# Patient Record
Sex: Female | Born: 1995 | Race: Asian | Hispanic: No | Marital: Single
Health system: Midwestern US, Academic
[De-identification: ages and names within clinical notes are randomized; demographics above are authoritative.]

---

## 2018-01-15 IMAGING — CT Head^_WITHOUT_CONTRAST (Adult)
1 series · 16 of 30 positions shown, 20 images · non-contrast
Comparison: none

PROCEDURE: Head _WITHOUT_CONTRAST (Adult)
HISTORY: HX SCHIZO AFFECTIVE DISORDER. PT PRESENTS COMBATIVE, CONFUSED. INIDEU
PUNIT FIFE IS COMING. NEG UCG
TECHNIQUE: Axial CT imaging of the brain was performed without contrast. No
comparison study available.

[Series 2: brain w/o 4.8 brain · axial · non-contrast · 0.46mm/px · z∈[+96,+236]mm · 16 of 33 slices shown, 20 images]
[im 2/33  brain]
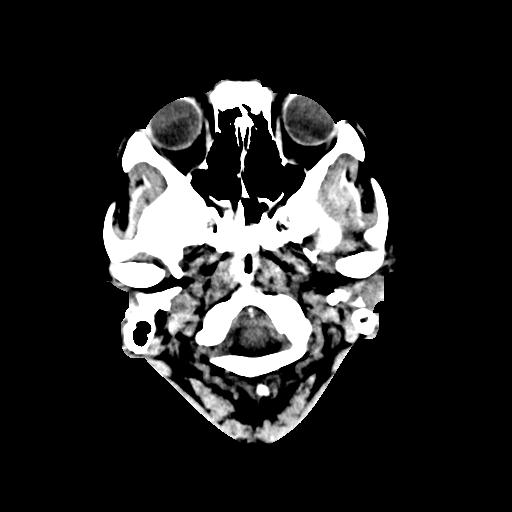
[im 2/33  bone]
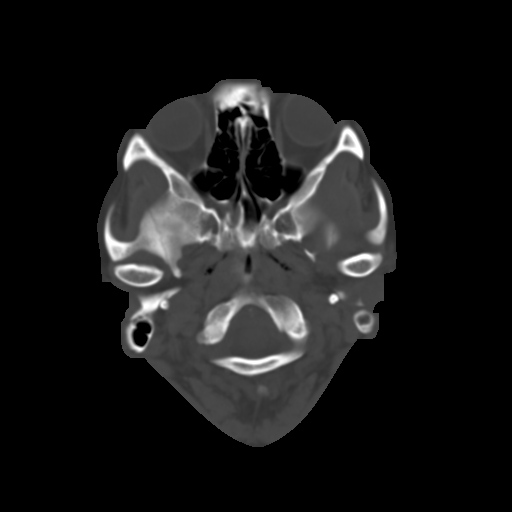
[im 4/33  brain]
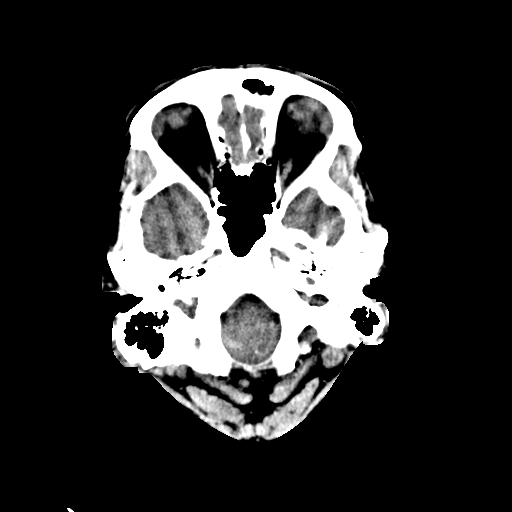
[im 6/33  brain]
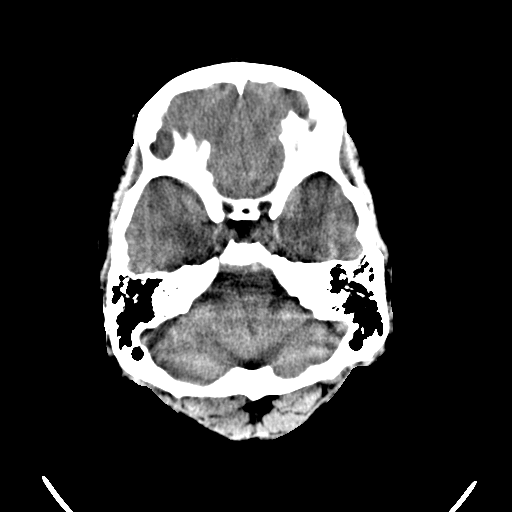
[im 8/33  brain]
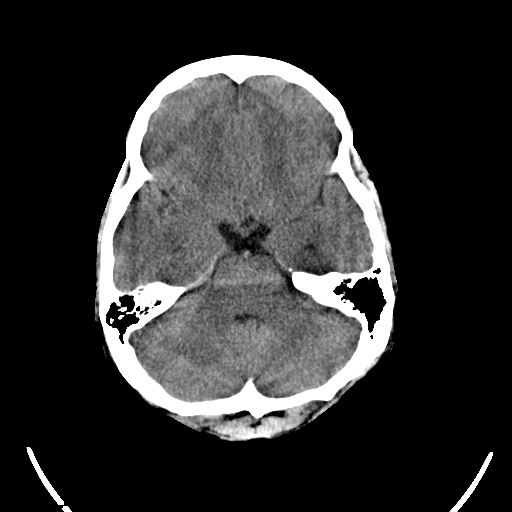
[im 9/33  brain]
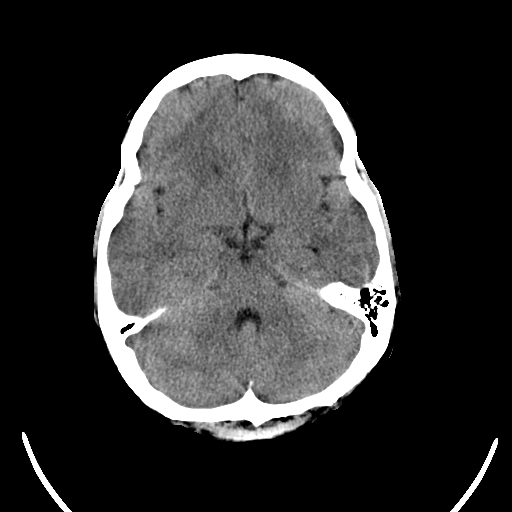
[im 9/33  bone]
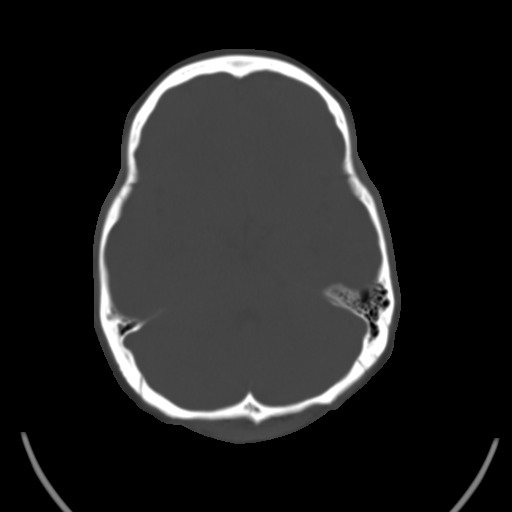
[im 12/33  brain]
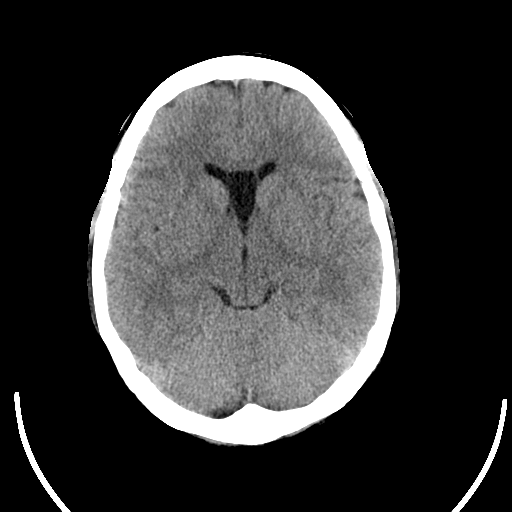
[im 14/33  brain]
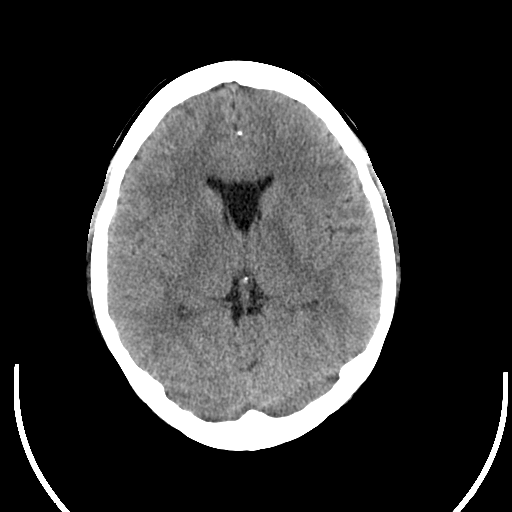
[im 16/33  brain]
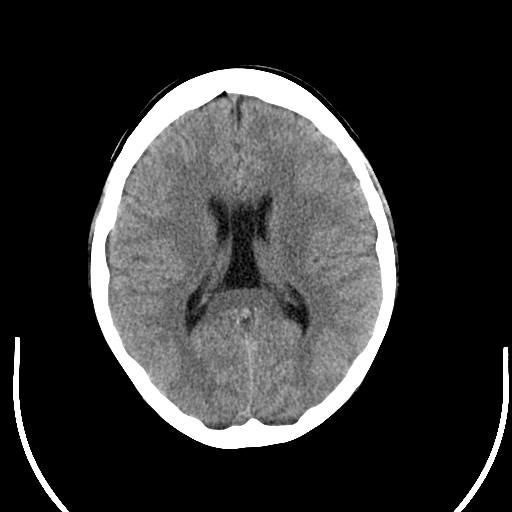
[im 17/33  brain]
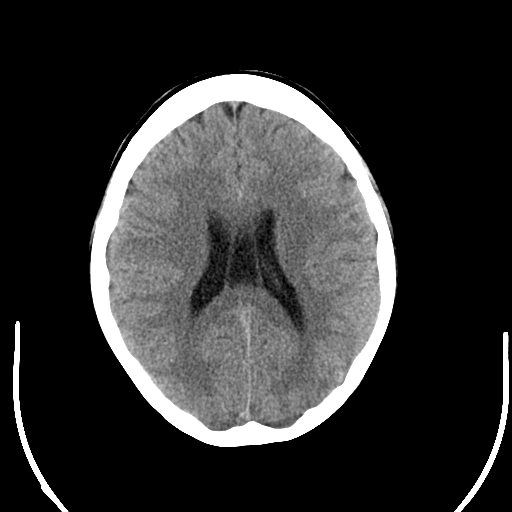
[im 17/33  bone]
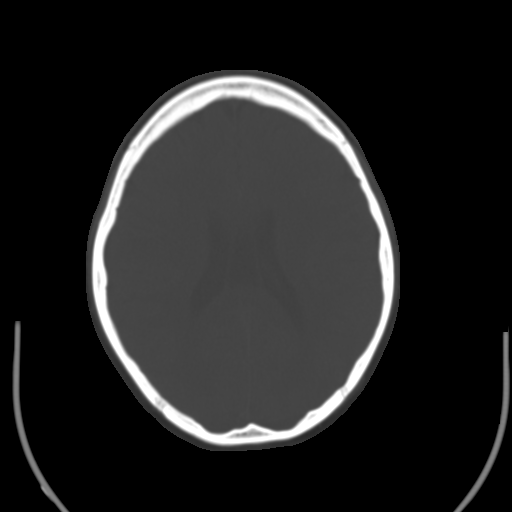
[im 19/33  brain]
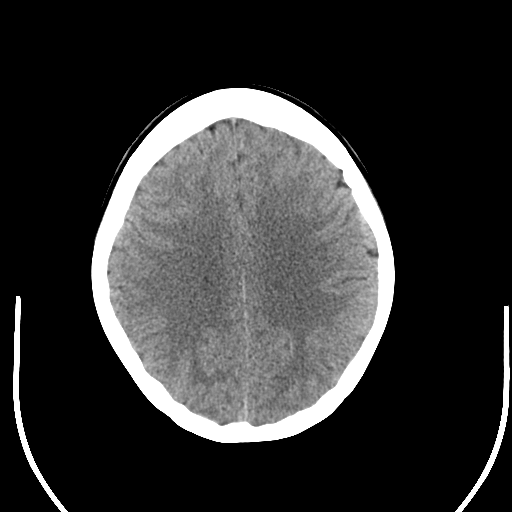
[im 21/33  brain]
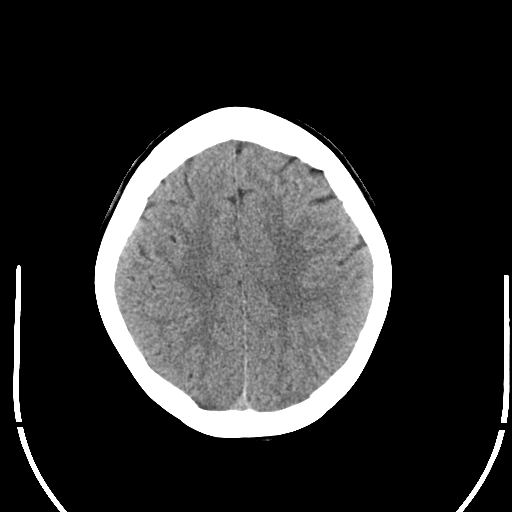
[im 24/33  brain]
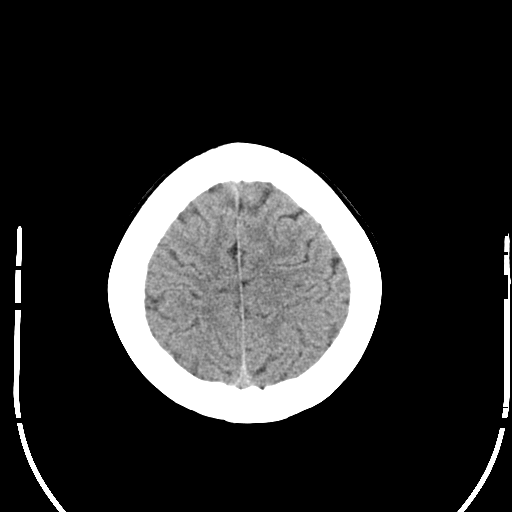
[im 25/33  brain]
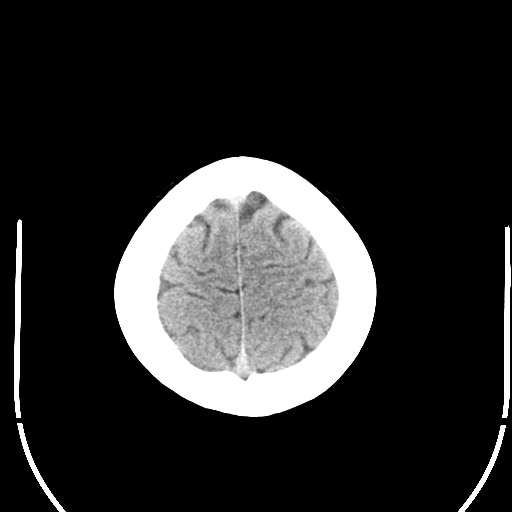
[im 25/33  bone]
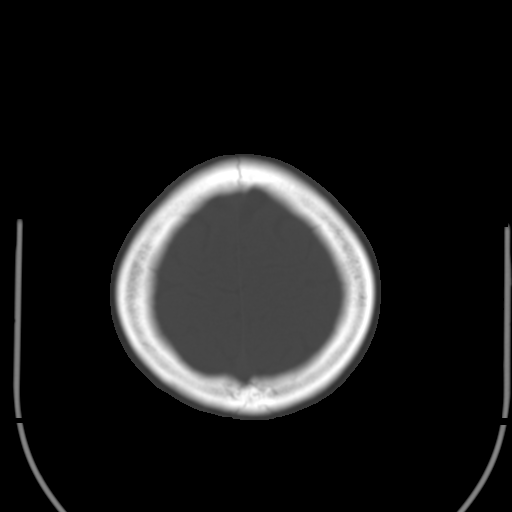
[im 27/33  brain]
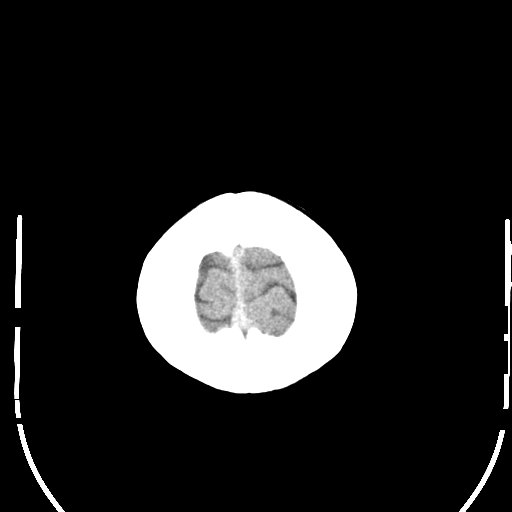
[im 29/33  brain]
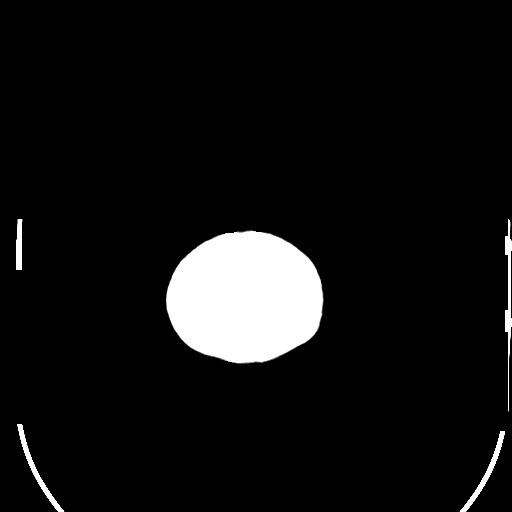
[im 31/33  brain]
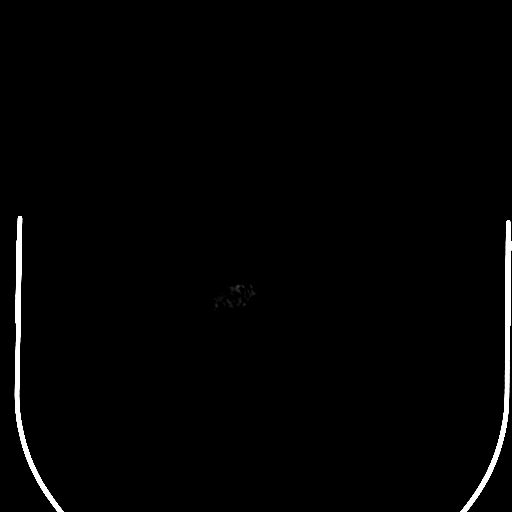

[16 of 30 positions shown; findings below may reference images not displayed]

FINDINGS: There is good gray white differentiation and sulcal detail. I don't see any acute
large vessel distribution infarction, intracranial bleed, or focal mass. No significant
periventricular or subcortical hypodensities are seen. The ventricles are normal in size and
midline in position. Incidental cavum vergae and septum pellucidum is seen. I don't see
any cerebellopontine angle mass. CT visualization of the brainstem, basal ganglia, and
cerebellum are within normal limits. No displaced skull fracture seen. The paranasal
sinuses are clear.
IMPRESSION: No acute large vessel distribution infarction, intracranial bleed, or focal mass seen.

Tech Notes:

PT COMBATIVE, HX SCHIZO-AFFECTIVE DISORDER.  BELIEVES THE PUNIT FIFE IS COMING.

## 2018-02-08 IMAGING — CR CHEST
1 series · 1 of 1 positions shown · non-contrast
Comparison: none

[chest port]
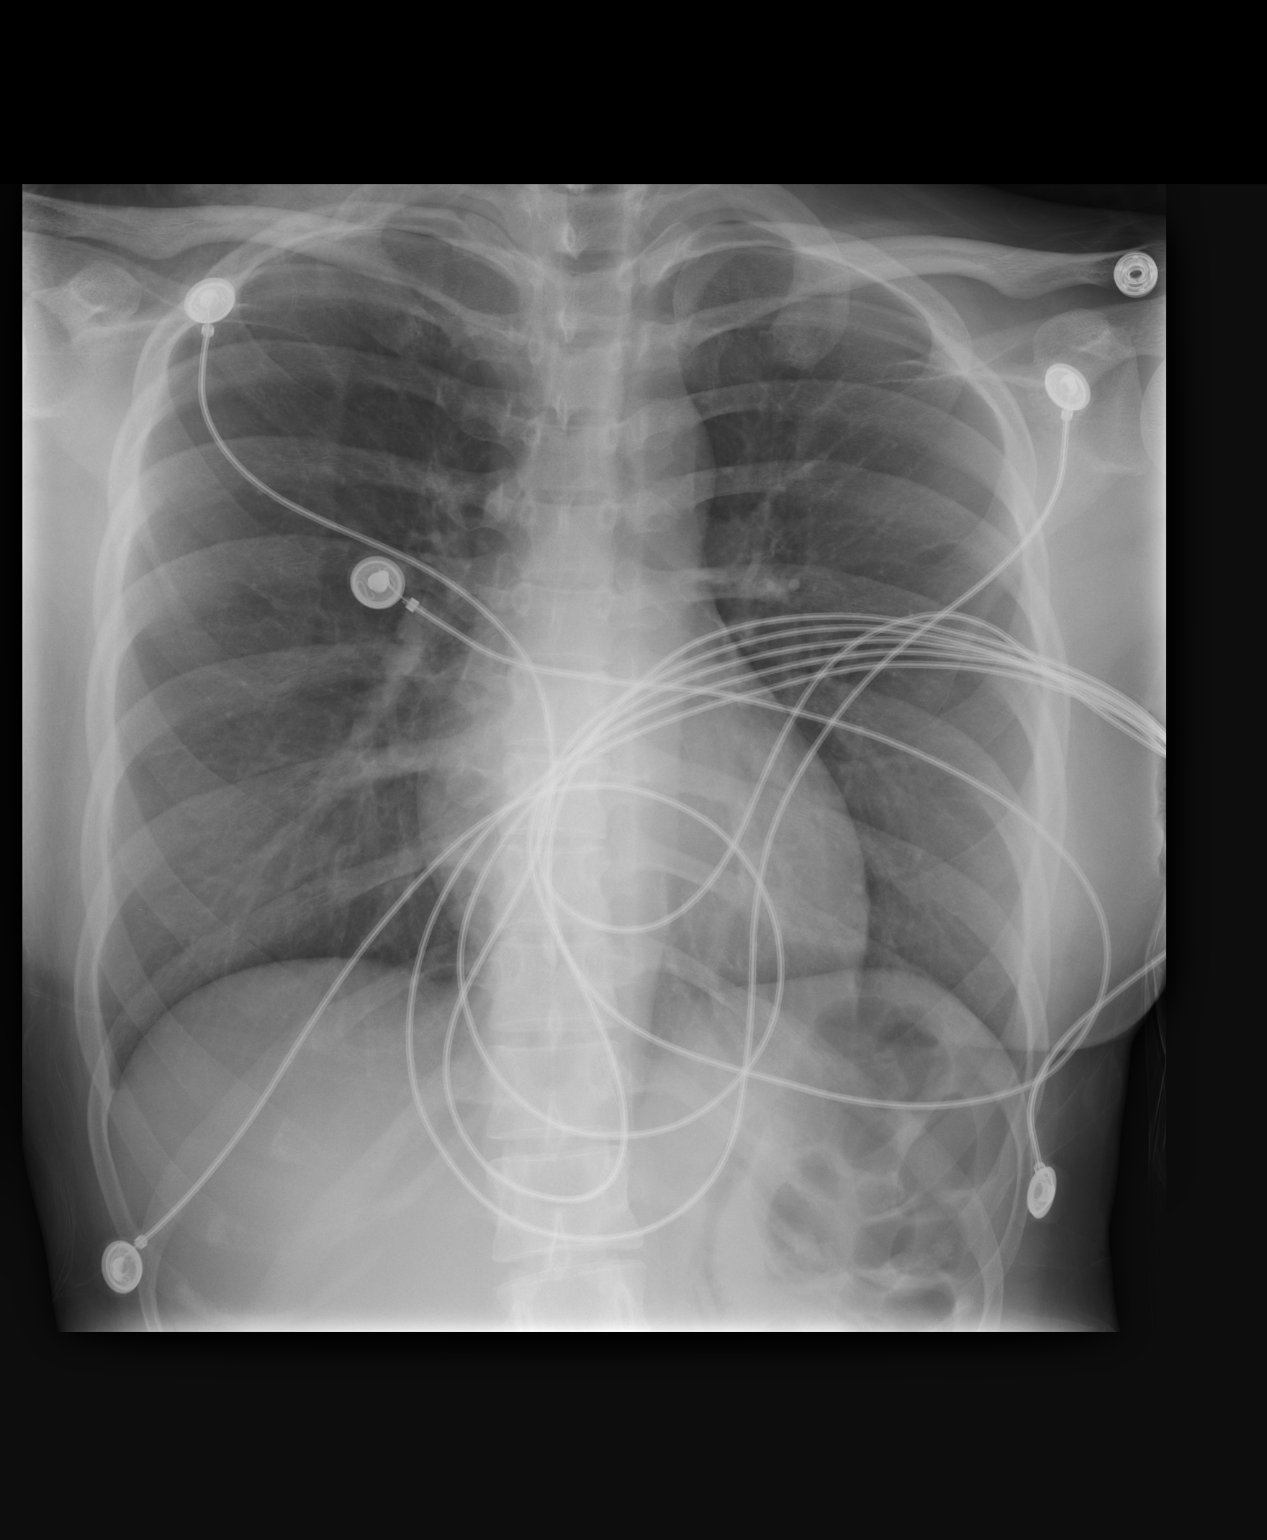

[1 of 1 positions shown; findings below may reference images not displayed]

EXAM
RADIOLOGICAL EXAMINATION, CHEST; SINGLE VIEW, FRONTAL CPT 46464

INDICATION
Overdose
AMS, POSSIBLE OVERDOSE - PT SHIELDED - AK

TECHNIQUE
A single portable view of the chest was obtained.

COMPARISONS
None

FINDINGS
The lungs are clear and the heart size and pulmonary vascularity are normal.

IMPRESSION
Normal portable chest x-ray.

Tech Notes:

AMS, POSSIBLE OVERDOSE - PT SHIELDED - AK

## 2018-02-08 IMAGING — CT Head^_WITHOUT_CONTRAST (Adult)
2 series · 14 of 30 positions shown, 16 images · non-contrast
Comparison: none

[Series 2: brain w/o 4.8 brain · axial · non-contrast · 0.55mm/px · z∈[+113,+201]mm · 6 of 11 slices shown, 8 images]
[im 2/11  brain]
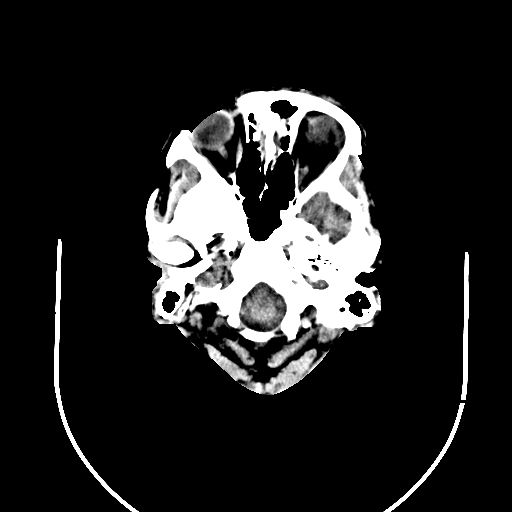
[im 2/11  bone]
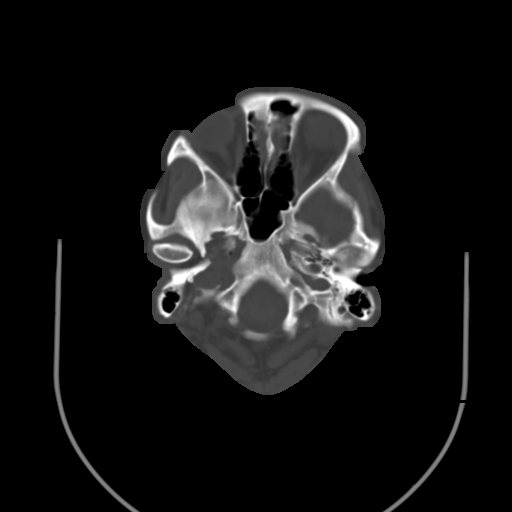
[im 3/11  brain]
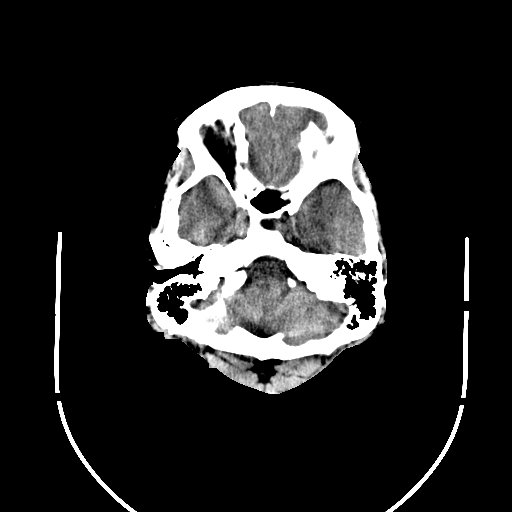
[im 5/11  brain]
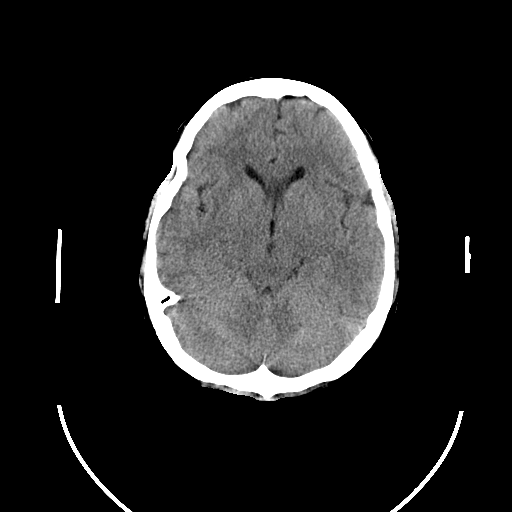
[im 6/11  brain]
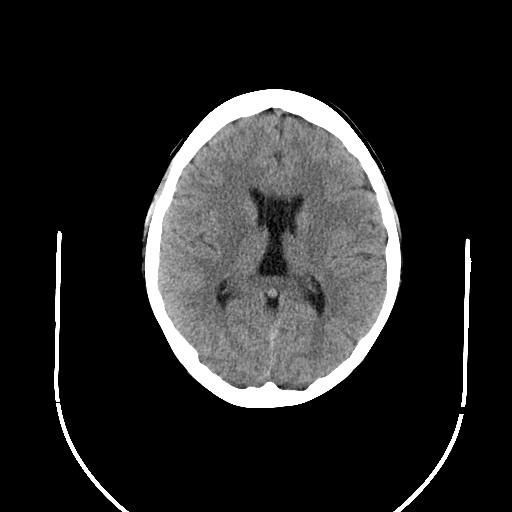
[im 8/11  brain]
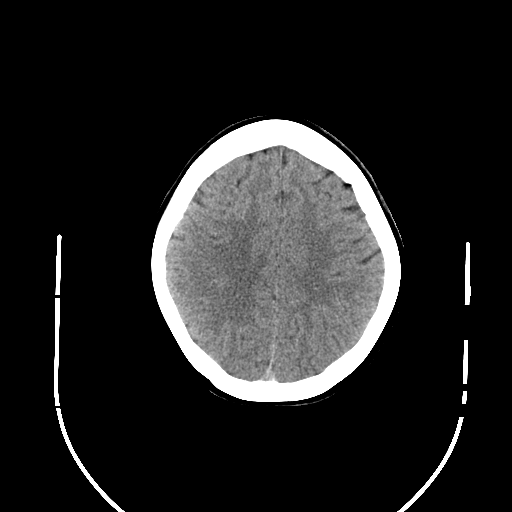
[im 8/11  bone]
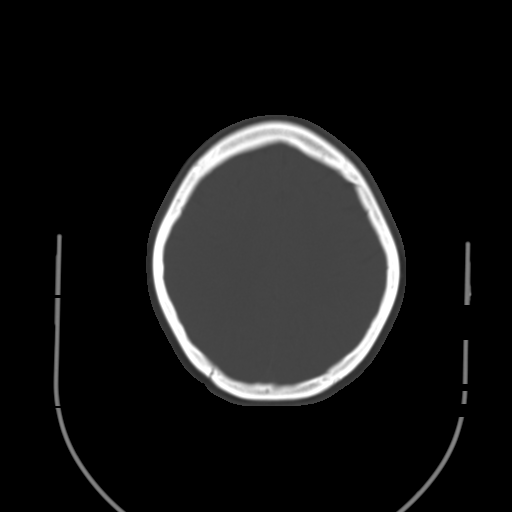
[im 9/11  brain]
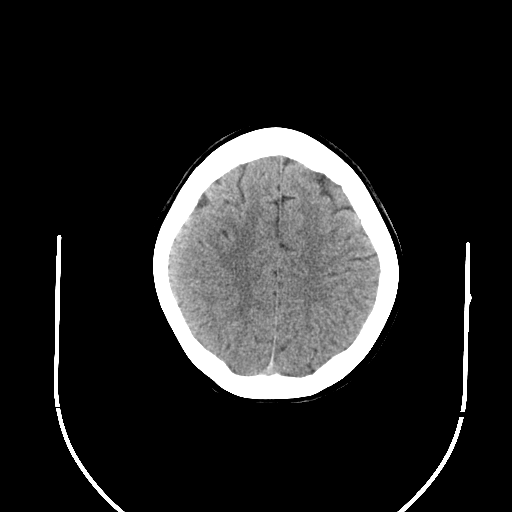

[Series 3: brain w/o 4.8 bone · axial · non-contrast · 0.55mm/px · z∈[+113,+235]mm · 8 of 33 slices shown]
[im 4/33  bone]
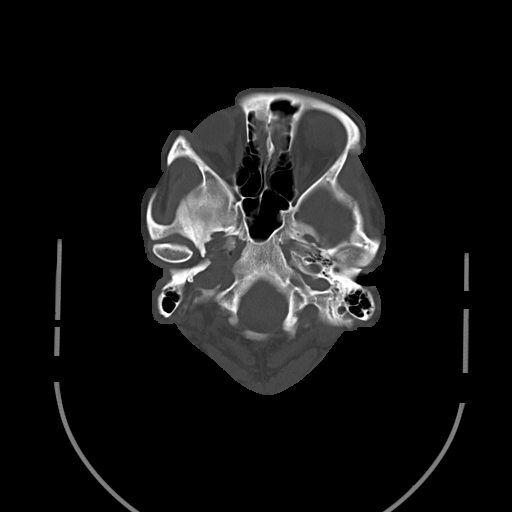
[im 7/33  bone]
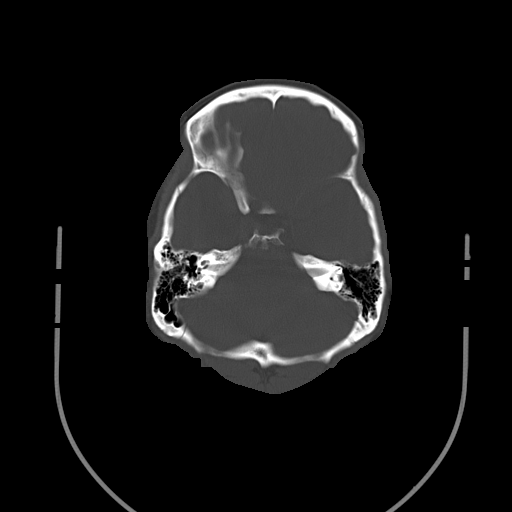
[im 11/33  bone]
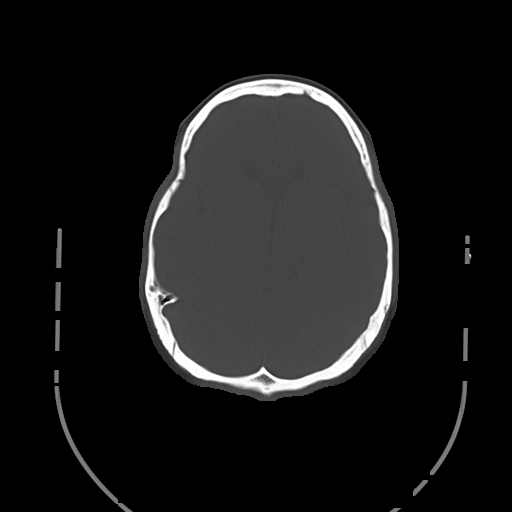
[im 14/33  bone]
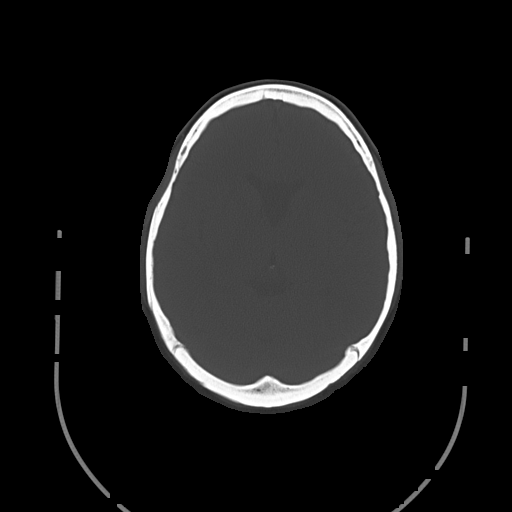
[im 19/33  bone]
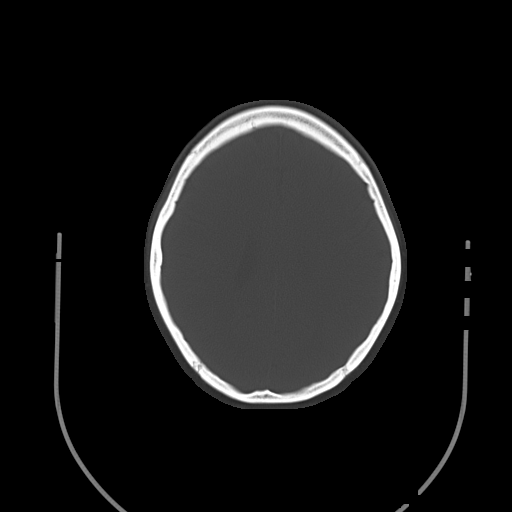
[im 22/33  bone]
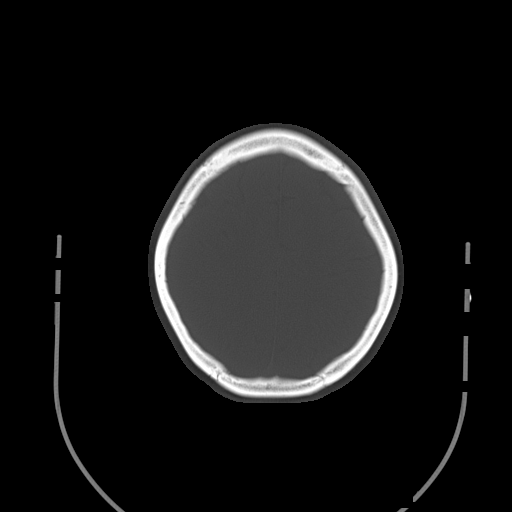
[im 26/33  bone]
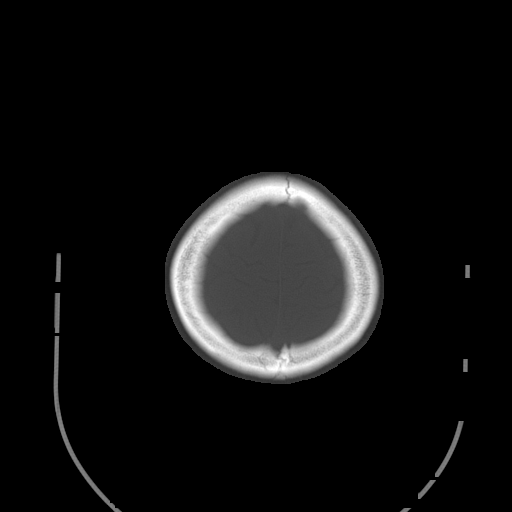
[im 29/33  bone]
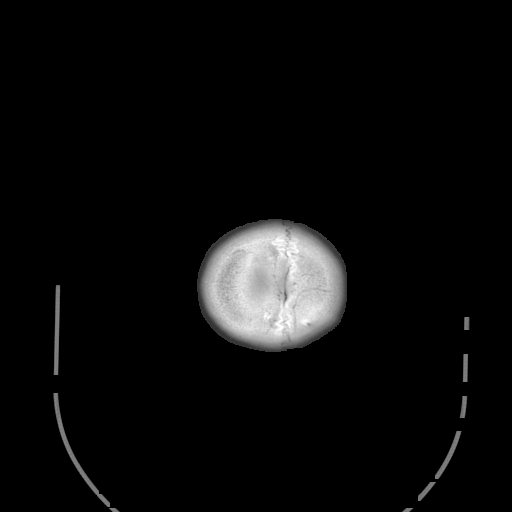

[14 of 30 positions shown; findings below may reference images not displayed]

EXAM
Head CT without contrast.

INDICATION
Altered mental status
ams- possible overdose- pt shielded - AK

FINDINGS
CT of the head was performed without contrast.
All CT scans at this facility use dose modulation, iterative reconstruction, and/or weight based
dosing when appropriate to reduce radiation dose to as low as reasonably achievable.
The prior study was reviewed from 01/15/2018.
There is no acute intracranial hemorrhage. There is no mass effect or midline shift.
There is a cavum septum vergae.
There is no hydrocephalus.
The skull appears normal.

IMPRESSION
There is no significant CT abnormality of the head. There has been no change from the prior study.

Tech Notes:

ams- possible overdose- pt shielded - [place]

## 2018-02-09 IMAGING — CR UP_EXM
2 series · 2 of 2 positions shown · non-contrast
Comparison: none

[hand]
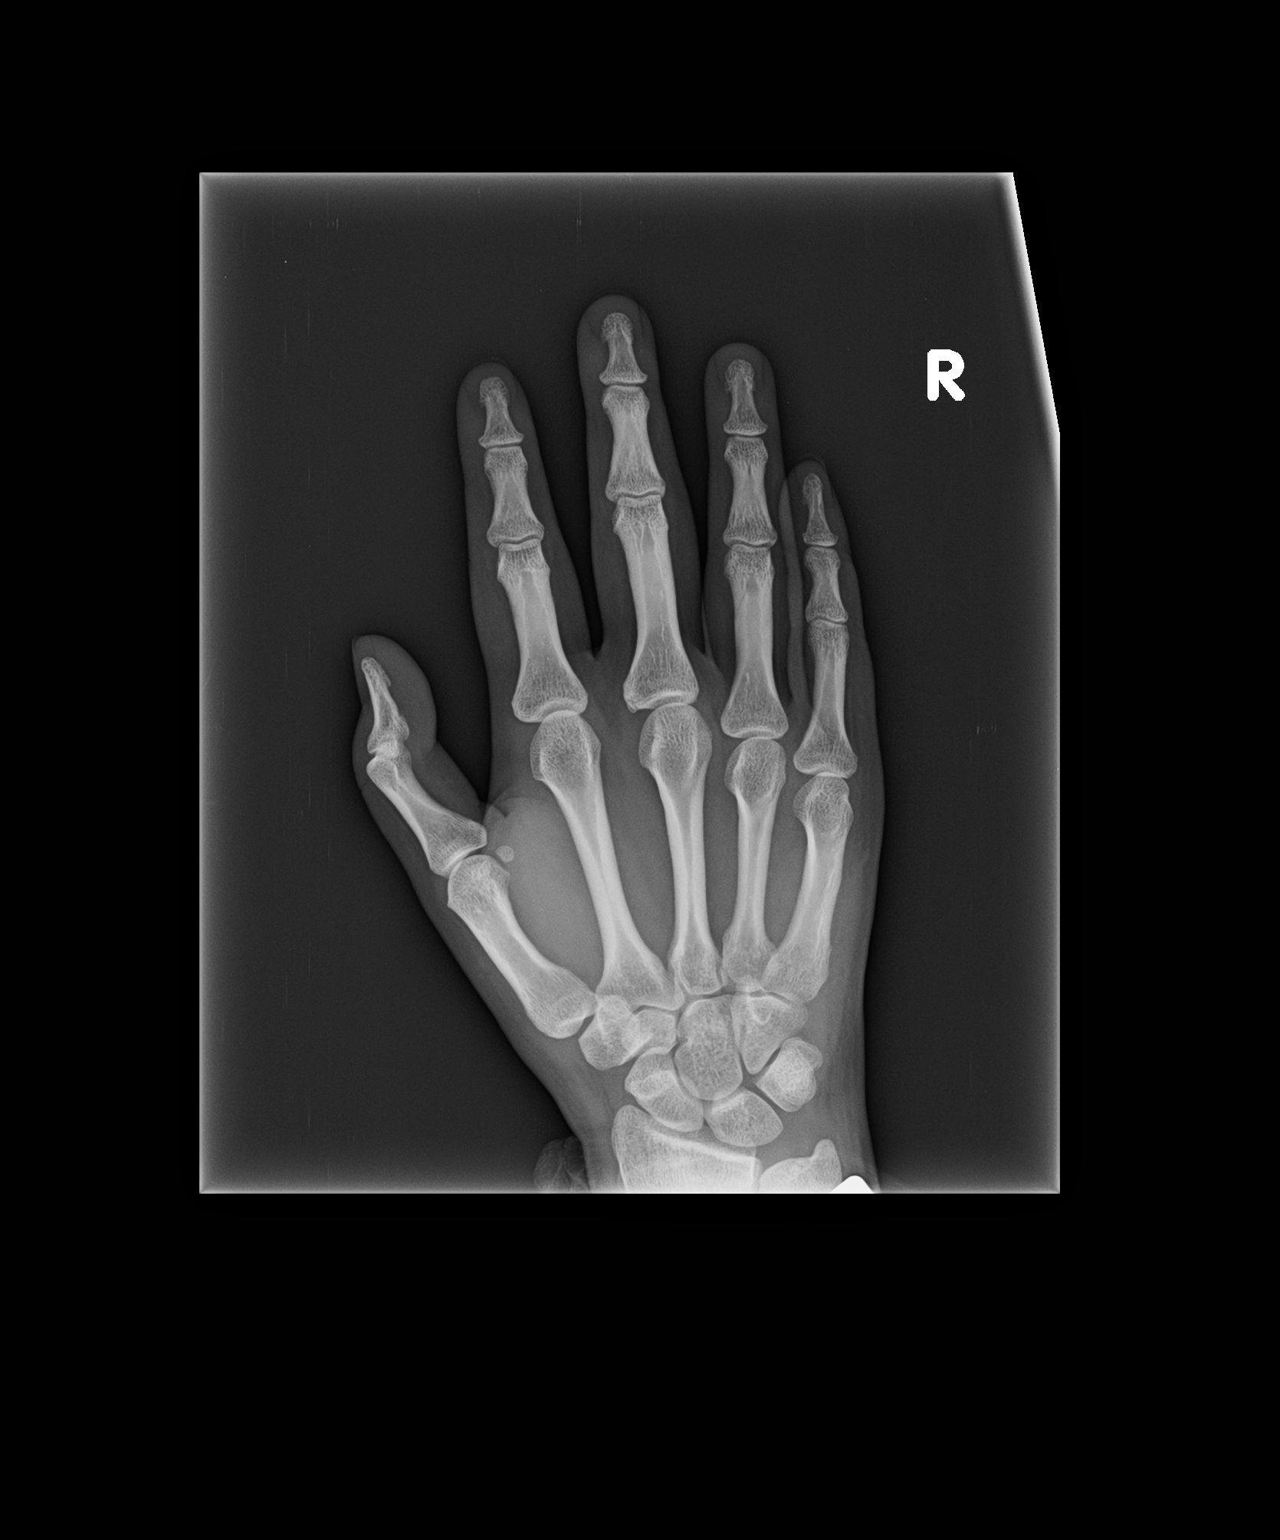

[hand lat]
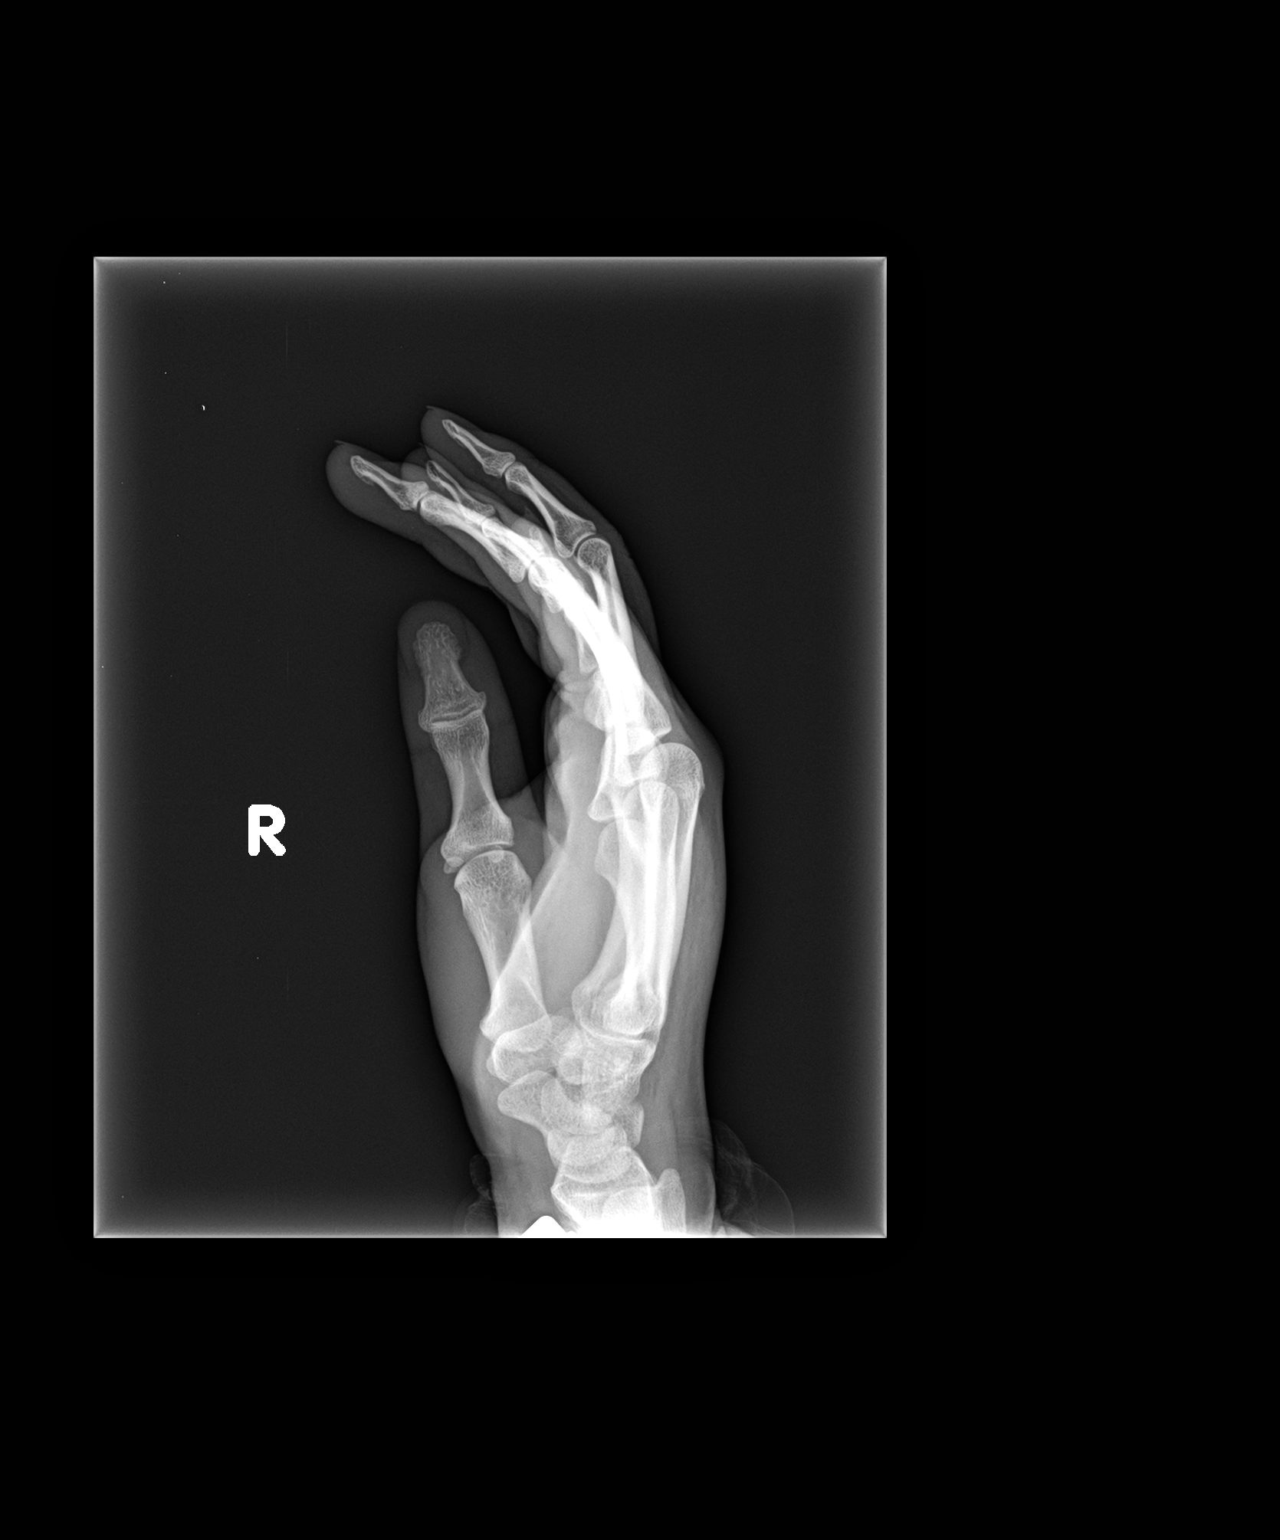

[2 of 2 positions shown; findings below may reference images not displayed]

EXAM
2 views right hand

INDICATION
pt injured R hand
PT HIT HER HAND ON A DOOR TODAY. BRUISING/SWELLING ACROSS 2ND AND 3RD DISTAL METACARPALS. ME

TECHNIQUE
AP and lateral views were obtained

COMPARISONS
None available.

FINDINGS
There is no acute fracture, dislocation, or other acute osseus abnormality. There is moderate
superficial soft tissue swelling.

IMPRESSION
1.Moderate dorsal soft tissue swelling without displaced fracture.

Tech Notes:

PT HIT HER HAND ON A DOOR TODAY. BRUISING/SWELLING ACROSS 2ND AND 3RD DISTAL METACARPALS. ME

## 2020-05-05 IMAGING — MR Head^Brain
6 series · 14 of 14 positions shown · non-contrast
Comparison: none

[Series 2: T1 · sagittal · 5.0mm · 0.45mm/px · 4 of 4 slices shown]
[im 1/4]
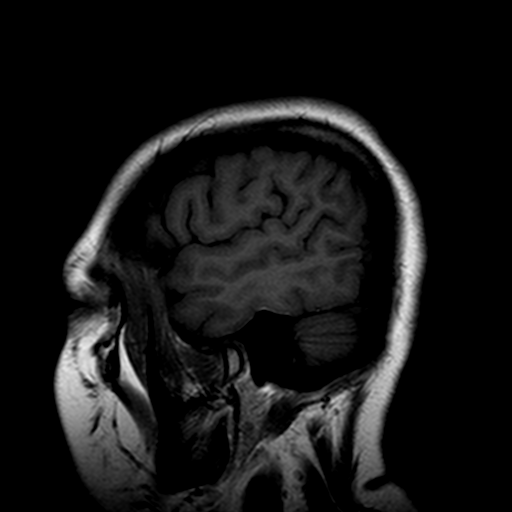
[im 2/4]
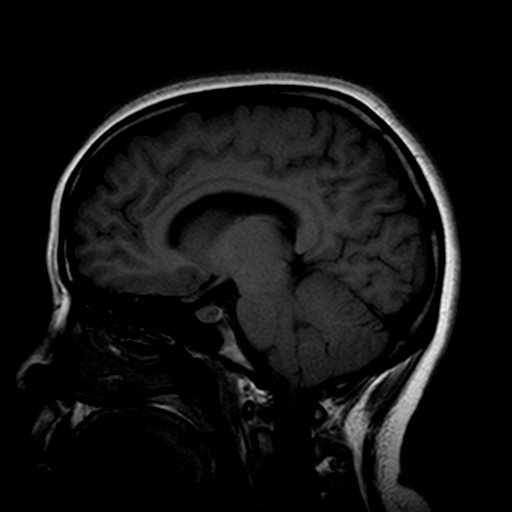
[im 3/4]
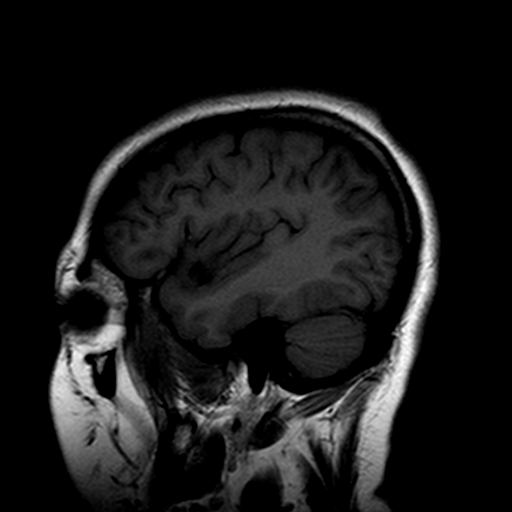
[im 4/4]
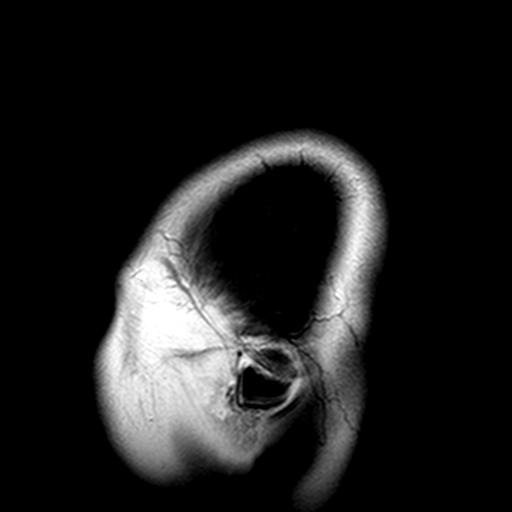

[Series 3: DWI · axial · 5.0mm · 1.80mm/px · 1 of 1 slices shown (1 of 2)]
[im 1/1]
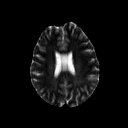

[Series 4: DWI · axial · 5.0mm · 1.80mm/px · z∈[-58,+32]mm · 3 of 3 slices shown (2 of 2)]
[im 1/3]
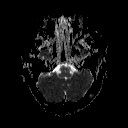
[im 2/3]
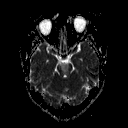
[im 3/3]
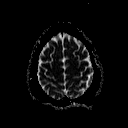

[Series 5: FLAIR · axial · 5.0mm · 0.45mm/px · 1 of 1 slices shown]
[im 1/1]
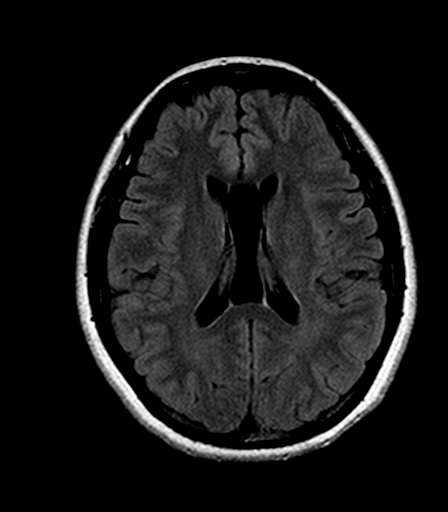

[Series 7: T2 · axial · 5.0mm · 0.72mm/px · z∈[-53,-1]mm · 3 of 3 slices shown]
[im 1/3]
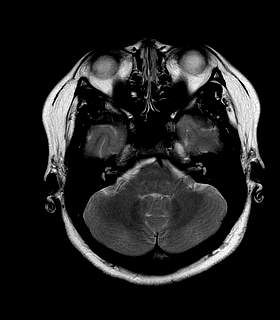
[im 2/3]
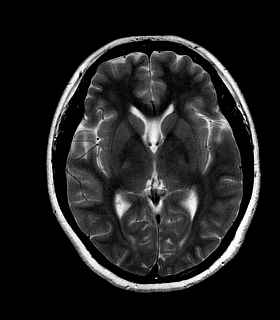
[im 3/3]
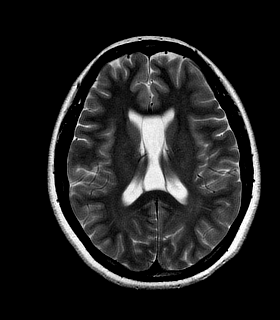

[Series 19: T1 fat-sat post-contrast · axial · 5.0mm · 0.90mm/px · z∈[+14,+53]mm · 2 of 2 slices shown]
[im 1/2]
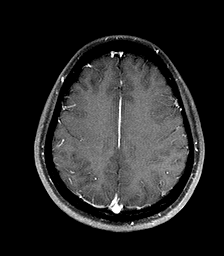
[im 2/2]
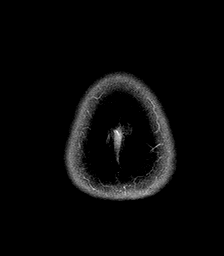

[14 of 14 positions shown; findings below may reference images not displayed]

EXAM

MRI brain with additional imaging of the pituitary gland

INDICATION

Elevated prolactin
Elevated prolactin levels - have doubled in past [AGE])    Patient received 15cc gadavist.
BG

FINDINGS

Sagittal and coronal pre and post contrast T1 weighted images of the pituitary were obtained to
include dynamic postcontrast coronal images. Axial T2, FLAIR, diffusion-weighted and postcontrast T1
weighted images of the brain were also obtained. An IV dose of 15 cc of contrast was administered.

There is a strictly shaped pituitary mass centered slightly to the left of the midline within the
anterior lobe of the pituitary. This measured 1.3 by 1.2 x 1.1 cm in craniocaudal, transverse and AP
diameter. This shows slightly less enhancement in the adjacent anterior pituitary lobe. It slightly
displaces the optic chiasm. It does not appear to invade the cavernous sinus.

No other mass lesion is identified.

Incidental note is made of a cavum septum pellucidum.

There is normal signal intensity throughout the cerebral and cerebellar hemispheres.

There is no restricted diffusion.

IMPRESSION

There is a strictly shaped pituitary tumor appears well demarcated and measures 13 x 12 x 11
millimeters in diameter. It extends to the inferior margin of the optic chiasm and minimally
displaces it. This most likely represents a pituitary macroadenoma.

No other significant abnormality is identified. Incidental note is made of a cavum septum
pellucidum.

Tech Notes:

Elevated prolactin levels - have doubled in past [AGE])

Patient received 15cc gadavist.
BG

## 2021-09-08 ENCOUNTER — Encounter: Admit: 2021-09-08 | Discharge: 2021-09-08

## 2021-09-23 ENCOUNTER — Encounter: Admit: 2021-09-23 | Discharge: 2021-09-23 | Payer: BC Managed Care – HMO

## 2021-09-23 ENCOUNTER — Emergency Department: Admit: 2021-09-23 | Discharge: 2021-09-23 | Payer: BC Managed Care – HMO

## 2021-09-23 ENCOUNTER — Inpatient Hospital Stay
Admit: 2021-09-23 | Discharge: 2021-11-02 | Disposition: A | Discharge status: Still In Hospital | Payer: BC Managed Care – HMO | Attending: Student in an Organized Health Care Education/Training Program | Admitting: Psychiatry

## 2021-09-23 DIAGNOSIS — F209 Schizophrenia, unspecified: Secondary | ICD-10-CM

## 2021-09-23 DIAGNOSIS — R051 Acute cough: Secondary | ICD-10-CM

## 2021-09-23 DIAGNOSIS — R4182 Altered mental status, unspecified: Secondary | ICD-10-CM

## 2021-09-23 DIAGNOSIS — D352 Benign neoplasm of pituitary gland: Secondary | ICD-10-CM

## 2021-09-23 DIAGNOSIS — D72829 Elevated white blood cell count, unspecified: Secondary | ICD-10-CM

## 2021-09-23 DIAGNOSIS — F29 Unspecified psychosis not due to a substance or known physiological condition: Secondary | ICD-10-CM

## 2021-09-23 LAB — URINALYSIS DIPSTICK REFLEX TO CULTURE
LEUKOCYTES: NEGATIVE
NITRITE: NEGATIVE
URINE ASCORBIC ACID, UA: NEGATIVE
URINE BILE: NEGATIVE
URINE BLOOD: NEGATIVE
URINE KETONE: NEGATIVE

## 2021-09-23 LAB — BENZODIAZEPINES-URINE RANDOM: BENZODIAZEPINES: NEGATIVE

## 2021-09-23 LAB — URINALYSIS MICROSCOPIC REFLEX TO CULTURE

## 2021-09-23 LAB — COMPREHENSIVE METABOLIC PANEL
ALBUMIN: 4.5 g/dL (ref 3.5–5.0)
ALK PHOSPHATASE: 88 U/L — ABNORMAL LOW (ref 25–110)
AST: 22 U/L (ref 7–40)
BLD UREA NITROGEN: 10 mg/dL (ref 7–25)
CALCIUM: 9.5 mg/dL (ref 8.5–10.6)
CHLORIDE: 103 MMOL/L (ref 98–110)
CO2: 22 MMOL/L (ref 21–30)
CREATININE: 0.6 mg/dL (ref 0.4–1.00)
GLUCOSE,PANEL: 110 mg/dL — ABNORMAL HIGH (ref 70–100)
SODIUM: 139 MMOL/L (ref 137–147)
TOTAL BILIRUBIN: 0.4 mg/dL — ABNORMAL LOW (ref 0.3–1.2)
TOTAL PROTEIN: 7.3 g/dL — ABNORMAL HIGH (ref 6.0–8.0)

## 2021-09-23 LAB — AMPHETAMINES-URINE RANDOM: AMPHETAMINES: NEGATIVE

## 2021-09-23 LAB — TSH WITH FREE T4 REFLEX: TSH: 1 uU/mL (ref 0.35–5.00)

## 2021-09-23 LAB — METHADONE-URINE SCREEN: METHADONE,URINE: NEGATIVE

## 2021-09-23 LAB — COCAINE-URINE RANDOM: COCAINE: NEGATIVE

## 2021-09-23 LAB — COVID-19 (SARS-COV-2) PCR

## 2021-09-23 LAB — OPIATES 300 OR GREATER-URINE RANDOM: OPIATES 300: NEGATIVE

## 2021-09-23 LAB — CBC AND DIFF: WBC COUNT: 13 K/UL — ABNORMAL HIGH (ref 4.5–11.0)

## 2021-09-23 LAB — BARBITURATES-URINE RANDOM: BARBITURATES: NEGATIVE

## 2021-09-23 LAB — PHENCYCLIDINES-URINE RANDOM: PHENCYCLIDINE (PCP): NEGATIVE

## 2021-09-23 LAB — CANNABINOIDS-URINE RANDOM: CANNABINOIDS (THC): NEGATIVE

## 2021-09-23 LAB — OXYCODONE URINE SCREEN: OXYCODONE (PERCODAN): NEGATIVE

## 2021-09-23 MED ORDER — SENNOSIDES-DOCUSATE SODIUM 8.6-50 MG PO TAB
1 | Freq: Every day | ORAL | 0 refills | Status: DC | PRN
Start: 2021-09-23 — End: 2021-11-02

## 2021-09-23 MED ORDER — ONDANSETRON HCL (PF) 4 MG/2 ML IJ SOLN
4 mg | INTRAVENOUS | 0 refills | Status: DC | PRN
Start: 2021-09-23 — End: 2021-09-26

## 2021-09-23 MED ORDER — MELATONIN 5 MG PO TAB
5 mg | Freq: Every evening | ORAL | 0 refills | Status: DC | PRN
Start: 2021-09-23 — End: 2021-10-02
  Administered 2021-09-25 – 2021-10-01 (×5): 5 mg via ORAL

## 2021-09-23 MED ORDER — DEXTROMETHORPHAN-GUAIFENESIN 10-100 MG/5 ML PO SYRP
10 mL | ORAL | 0 refills | Status: DC | PRN
Start: 2021-09-23 — End: 2021-09-24
  Administered 2021-09-24: 05:00:00 10 mL via ORAL

## 2021-09-23 MED ORDER — BROMOCRIPTINE 2.5 MG PO TAB
25 mg | Freq: Every evening | ORAL | 0 refills | Status: DC
Start: 2021-09-23 — End: 2021-09-27
  Administered 2021-09-24 – 2021-09-27 (×4): 25 mg via ORAL

## 2021-09-23 MED ORDER — LORAZEPAM 2 MG/ML IJ SOLN
1 mg | Freq: Once | INTRAVENOUS | 0 refills | Status: CP
Start: 2021-09-23 — End: ?

## 2021-09-23 MED ORDER — BROMOCRIPTINE 2.5 MG PO TAB
5 mg | Freq: Every evening | ORAL | 0 refills | Status: DC
Start: 2021-09-23 — End: 2021-09-24

## 2021-09-23 MED ORDER — ONDANSETRON 4 MG PO TBDI
4 mg | ORAL | 0 refills | Status: DC | PRN
Start: 2021-09-23 — End: 2021-09-26

## 2021-09-23 MED ORDER — LACTATED RINGERS IV SOLP
1000 mL | INTRAVENOUS | 0 refills | Status: CP
Start: 2021-09-23 — End: ?
  Administered 2021-09-23: 20:00:00 1000 mL via INTRAVENOUS

## 2021-09-23 MED ORDER — POLYETHYLENE GLYCOL 3350 17 GRAM PO PWPK
1 | Freq: Every day | ORAL | 0 refills | Status: DC | PRN
Start: 2021-09-23 — End: 2021-11-02

## 2021-09-23 MED ORDER — ACETAMINOPHEN 325 MG PO TAB
650 mg | ORAL | 0 refills | Status: DC | PRN
Start: 2021-09-23 — End: 2021-11-02
  Administered 2021-09-28 – 2021-10-26 (×6): 650 mg via ORAL

## 2021-09-23 MED ORDER — HALOPERIDOL 5 MG PO TAB
5 mg | Freq: Once | ORAL | 0 refills | Status: CP
Start: 2021-09-23 — End: ?
  Administered 2021-09-23: 17:00:00 5 mg via ORAL

## 2021-09-23 MED ADMIN — LORAZEPAM 2 MG/ML IJ SOLN [10467]: 1 mg | INTRAVENOUS | @ 17:00:00 | Stop: 2021-09-23 | NDC 00641604401

## 2021-09-23 NOTE — ED Notes
Kristin Maynard is a 26 year old female who arrives with her father for a medical and psych evaluation. He states patient was checked in to go to [facility] but they wanted medical clearance first. Patient was found three blocks from home with no coat or shoes this morning. She does have a history of schizophrenia. Father denies changes in medications. She states she was trying to hurt herself this morning but would not say how. Patient keeps stating "I need a heart transplant. I need a transfusion." Patient is keeping eyes shut. Provider at bedside.    Belongings: shoes and paper scrubs.

## 2021-09-23 NOTE — ED Notes
All belongings sent with family.

## 2021-09-24 ENCOUNTER — Encounter: Admit: 2021-09-24 | Discharge: 2021-09-24 | Payer: BC Managed Care – HMO

## 2021-09-24 MED ORDER — GUAIFENESIN 100 MG/5 ML PO LIQD
200 mg | ORAL | 0 refills | Status: DC | PRN
Start: 2021-09-24 — End: 2021-09-26

## 2021-09-24 MED ORDER — TRAZODONE 50 MG PO TAB
50 mg | Freq: Every evening | ORAL | 0 refills | Status: DC
Start: 2021-09-24 — End: 2021-10-02
  Administered 2021-09-25 – 2021-10-02 (×7): 50 mg via ORAL

## 2021-09-24 MED ORDER — ARIPIPRAZOLE 15 MG PO TAB
30 mg | Freq: Every day | ORAL | 0 refills | Status: DC
Start: 2021-09-24 — End: 2021-09-24
  Administered 2021-09-24: 19:00:00 30 mg via ORAL

## 2021-09-24 NOTE — ED Notes
Call center states the patient was declined d/t acuity stating concerns with her behaviors related to her schizophrenia. Specifically her being found naked outside by her father.     Patient may be represented tomorrow

## 2021-09-24 NOTE — Progress Notes
General Progress Note    Name:  Kristin Maynard   ZOXWR'[id number] Date:  09/24/2021  Admission Date: 09/23/2021  LOS: 1 day                     Assessment/Plan:    Principal Problem:    Schizophrenia (HCC)      Pituitary macroadenoma  Hyperprolactinemia  Amenorrhea  - CT head: Suprasellar mass measuring up to 1.4 cm consistent with reported macroadenoma  - Patient had no headaches. No vision problems or changes  - continue PTA bromocriptine  ?  Sinus tachycardia: Improving  Leukocytosis: Improving  - HR 100-130's  - ECG: sinus tachycardia, normal QTC  - UA and chest xray negative   -Suspect tachycardia due to anxiety and agitation  -Encourage oral fluids  Monitor  ?  Schizophrenia  - UDS ; negative  - psych consulted   - CO at bedside    Fen  IV fluids;none  Renal diet  Monitor lites    Dispo: Patient is medically stable for inpatient psych placement when bed available  ________________________________________________    _____________________________________________________________________    Subjective  Kristin Maynard is a 26 y.o. female.  No acute overnight events.  Patient reports doing fine this morning.  Requesting to go home.  Patient states she is in the hospital for schizophrenia.  Patient only complaints of cough which has been ongoing for few weeks but improving.  No shortness of breath.  No fevers or chills.  No headache.  No double vision.  Reports ongoing auditory and visual hallucinations.    Medications  Scheduled Meds:ARIPiprazole (ABILIFY) tablet 30 mg, 30 mg, Oral, QDAY  bromocriptine (PARLODEL) tablet 25 mg, 25 mg, Oral, QHS  traZODone (DESYREL) tablet 50 mg, 50 mg, Oral, QHS    Continuous Infusions:  PRN and Respiratory Meds:acetaminophen Q6H PRN, guaiFENesin  (ROBITUSSIN) oral solution Q4H PRN, melatonin QHS PRN, ondansetron Q6H PRN **OR** ondansetron (ZOFRAN) IV Q6H PRN, polyethylene glycol 3350 QDAY PRN, senna/docusate QDAY PRN      Review of Systems:  A 14 point review of systems was negative except for: Behvioral/Psych: positive for Hallucinations    Objective:                          Vital Signs: Last Filed                 Vital Signs: 24 Hour Range   BP: 153/100 (03/12 0913)  Temp: 36.8 ?C (98.2 ?F) (03/12 0913)  Pulse: 120 (03/12 0913)  Respirations: 16 PER MINUTE (03/12 0913)  SpO2: 96 % (03/12 0913)  O2 Device: None (Room air) (03/12 0913)  SpO2 Pulse: 97 (03/11 2144) BP: (123-153)/(75-107)   Temp:  [36.8 ?C (98.2 ?F)-37.1 ?C (98.7 ?F)]   Pulse:  [94-130]   Respirations:  [16 PER MINUTE-18 PER MINUTE]   SpO2:  [96 %-98 %]   O2 Device: None (Room air)     Vitals:    09/23/21 1034 09/23/21 2120   Weight: 88.5 kg (195 lb) 88.1 kg (194 lb 3.6 oz)       Intake/Output Summary:  (Last 24 hours)  No intake or output data in the 24 hours ending 09/24/21 1233        Physical Exam    General: AO3x, no  distress   Head: NCAT  Eyes: PERRLA EOMI,   ENT: Normal external canals b/l  Neck: supple   Lungs: CTAB  Chest wall: No tenderness,   Heart: RRR, no murmurs.  Abdomen: soft ,non-tender, bs+  Extremities:no edema b/l  Peripheral pulses: 2+ and symmetric, all extremities  Skin: dry  Lymph nodes: No cervical LAD  Neurologic:grossly normal   Musculoskeletal: ntd  Lab Review  24-hour labs:    Results for orders placed or performed during the hospital encounter of 09/23/21 (from the past 24 hour(s))   URINALYSIS DIPSTICK REFLEX TO CULTURE    Collection Time: 09/23/21  1:55 PM    Specimen: Urine   Result Value Ref Range    Color,UA STRAW     Turbidity,UA CLEAR CLEAR-CLEAR    Specific Gravity-Urine 1.006 1.005 - 1.030    pH,UA 6.0 5.0 - 8.0    Protein,UA NEG NEG-NEG    Glucose,UA NEG NEG-NEG    Ketones,UA NEG NEG-NEG    Bilirubin,UA NEG NEG-NEG    Blood,UA NEG NEG-NEG    Urobilinogen,UA NORMAL NORM-NORMAL    Nitrite,UA NEG NEG-NEG    Leukocytes,UA NEG NEG-NEG    Urine Ascorbic Acid, UA NEG NEG-NEG   URINALYSIS MICROSCOPIC REFLEX TO CULTURE    Collection Time: 09/23/21  1:55 PM    Specimen: Urine   Result Value Ref Range    WBCs,UA 0-2 0 - 2 /HPF    RBCs,UA 0-2 0 - 3 /HPF    Comment,UA       Criteria for reflex to culture are WBC>10, Positive Nitrite, and/or >=+1   leukocytes. If quantity is not sufficient, an addendum will follow.      MucousUA TRACE     Squamous Epithelial Cells 0-2 0 - 5   AMPHETAMINES-URINE RANDOM    Collection Time: 09/23/21  1:55 PM   Result Value Ref Range    Amphetamines NEG NEG-NEG   BARBITURATES-URINE RANDOM    Collection Time: 09/23/21  1:55 PM   Result Value Ref Range    Barbiturates,Urine NEG NEG-NEG   BENZODIAZEPINES-URINE RANDOM    Collection Time: 09/23/21  1:55 PM   Result Value Ref Range    Benzodiazepines NEG NEG-NEG   CANNABINOIDS-URINE RANDOM    Collection Time: 09/23/21  1:55 PM   Result Value Ref Range    THC NEG NEG-NEG   COCAINE-URINE RANDOM    Collection Time: 09/23/21  1:55 PM   Result Value Ref Range    Cocaine-Urine NEG NEG-NEG   METHADONE-URINE SCREEN    Collection Time: 09/23/21  1:55 PM   Result Value Ref Range    Methadone NEG NEG-NEG   OPIATES 300 OR GREATER-URINE RANDOM    Collection Time: 09/23/21  1:55 PM   Result Value Ref Range    Opiates 300 NEG NEG-NEG   OXYCODONE URINE SCREEN    Collection Time: 09/23/21  1:55 PM   Result Value Ref Range    Oxycodone, Urine NEG NEG-NEG   PHENCYCLIDINES-URINE RANDOM    Collection Time: 09/23/21  1:55 PM   Result Value Ref Range    Phencyclidine (PCP) NEG NEG-NEG   CORTISOL,RANDOM    Collection Time: 09/24/21  3:30 AM   Result Value Ref Range    Cortisol, Random 13.1 5.0 - 20.0 MCG/DL   CBC    Collection Time: 09/24/21  3:31 AM   Result Value Ref Range    White Blood Cells 12.2 (H) 4.5 - 11.0 K/UL    RBC 4.50 4.0 - 5.0 M/UL    Hemoglobin 12.9 12.0 - 15.0 GM/DL    Hematocrit 21.3 36 - 45 %    MCV 87.7  80 - 100 FL    MCH 28.8 26 - 34 PG    MCHC 32.8 32.0 - 36.0 G/DL    RDW 52.8 11 - 15 %    Platelet Count 391 150 - 400 K/UL    MPV 6.9 (L) 7 - 11 FL   COMPREHENSIVE METABOLIC PANEL    Collection Time: 09/24/21  3:31 AM   Result Value Ref Range    Sodium 141 137 - 147 MMOL/L    Potassium 3.9 3.5 - 5.1 MMOL/L    Chloride 105 98 - 110 MMOL/L    Glucose 99 70 - 100 MG/DL    Blood Urea Nitrogen 10 7 - 25 MG/DL    Creatinine 4.13 0.4 - 1.00 MG/DL    Calcium 9.2 8.5 - 24.4 MG/DL    Total Protein 6.6 6.0 - 8.0 G/DL    Total Bilirubin 0.4 0.3 - 1.2 MG/DL    Albumin 4.1 3.5 - 5.0 G/DL    Alk Phosphatase 78 25 - 110 U/L    AST (SGOT) 17 7 - 40 U/L    CO2 25 21 - 30 MMOL/L    ALT (SGPT) 35 7 - 56 U/L    Anion Gap 11 3 - 12    eGFR >60 >60 mL/min   MAGNESIUM    Collection Time: 09/24/21  3:31 AM   Result Value Ref Range    Magnesium 2.0 1.6 - 2.6 mg/dL       Point of Care Testing  (Last 24 hours)  Glucose: 99 (09/24/21 0331)  Urine Pregnancy: Negative (09/23/21 1429)    Radiology and other Diagnostics Review:    Pertinent radiology reviewed.   CT HEAD WO CONTRAST    Result Date: 09/23/2021  1.  No acute intracranial hemorrhage, hydrocephalus, or herniation. 2.  Suprasellar mass measuring up to 1.4 cm consistent with reported macroadenoma, though this is incompletely evaluated on this exam. MRI brain with and without contrast could be obtained for further evaluation if clinically indicated. By my electronic signature, I attest that I have personally reviewed the images for this examination and formulated the interpretations and opinions expressed in this report  Finalized by Dimple Casey, M.D. on 09/23/2021 7:26 PM. Dictated by Meredith Mody, MD on 09/23/2021 7:14 PM.       Otto Herb, MD   Pager [phone]

## 2021-09-24 NOTE — H&P (View-Only)
Name:  Kristin Maynard                                             MRN:  [id number]   Admission Date:  09/23/2021                     Assessment/Plan:        Pituitary macroadenoma  Hyperprolactinemia  Amenorrhea  - CT head: Suprasellar mass measuring up to 1.4 cm consistent with reported macroadenoma  - Patient had no headaches. No vision problems or changes  - continue PTA bromocriptine    Sinus tachycardia  Leukocytosis  - HR 100-130's  - ECG: sinus tachycardia, normal QTC  - UA and chest xray negative     Schizophrenia  SI  - UDS pending   - psych consulted   - CO assigned      Discussed with ED attending and/or resident     Total Time Today was 75 minutes in the following activities: Preparing to see the patient, Performing a medically appropriate examination and/or evaluation, Counseling and educating the patient/family/caregiver, ordering medications, tests, or procedures, placing orders for admission Referring and communication with other health care professionals, Documenting clinical information in the electronic or other health record, Independently interpreting results and communicating results to the patient/family/caregiver and Care coordination.  ______________________________________________________________________________    Primary Care Physician: No Pcp, Na     Chief Complaint: altered mental status, psychosis, behavioral changes     History of Present Illness: Kristin Maynard is a 26 y.o. female with PMH of as below was brought to hospital by her father for altered mental status, psychosis, behavioral changes. Per father she was released from [facility] last night after a 2 week inpatient stay. Pt's father reports pt left home this am without telling anyone and was found 3 blocks down the street from their house with no coat and no shoes on. Pt's father denies any recent changes in medications, however, does state pt self stopped taking her medications ~1 month ago, prompting her admission to [facility].     In ED she was found to be tachycardic, WBC 13.9, chest xray negative, CT head showed Suprasellar mass measuring up to 1.4 cm consistent with reported macroadenoma. UA not consistent with uti, VS stable, she is being admitted for medical clearance.     Patient seen and examined, she reports that she had a heart transplant last night at [facility], she knows that she is at [facility], denies other complaints.   ?      Medical History:   Diagnosis Date   ? Schizophrenia (HCC)      No past surgical history on file.  No family history on file.  Social History     Socioeconomic History   ? Marital status: Single   Tobacco Use   ? Smoking status: Unknown      Immunizations (includes history and patient reported):   There is no immunization history on file for this patient.        Allergies:  Patient has no known allergies.    Medications:  (Not in a hospital admission)    Review of Systems:  A comprehensive  12 point review of organ systems reviewed and was negative except for the ones mentioned in HOPI    Physical Exam:  Vital Signs: Last Filed In  24 Hours Vital Signs: 24 Hour Range   BP: 145/107 (03/11 1900)  Temp: 37.1 ?C (98.7 ?F) (03/11 1900)  Pulse: 130 (03/11 1900)  Respirations: 16 PER MINUTE (03/11 1900)  SpO2: 97 % (03/11 1900)  O2 Device: None (Room air) (03/11 1300)  SpO2 Pulse: 105 (03/11 1300)  Height: 154.9 cm (5' 1) (03/11 1034) BP: (123-155)/(75-117)   Temp:  [36.8 ?C (98.3 ?F)-37.1 ?C (98.7 ?F)]   Pulse:  [107-130]   Respirations:  [16 PER MINUTE-20 PER MINUTE]   SpO2:  [96 %-100 %]   O2 Device: None (Room air)   Intensity Pain Scale (Self Report): 10 (09/23/21 1035)      General:  Alert, awake, oriented x 3 , cooperative, no distress, appears stated age  Head:  Normocephalic, without obvious abnormality, atraumatic  Eyes:  Conjunctivae/corneas clear   Nose: Nares normal. Mucosa normal.  No drainage or sinus tenderness  Throat: Lips, mucosa and tongue normal  Neck:    Supple, symmetrical, trachea midline, no adenopathy, thyroid: no enlargement/tenderness/nodules  Lungs:  Clear to auscultation bilaterally  Heart:   Regular rate and rhythm, S1, S2 normal, no murmur  Abdomen:  Soft, non-tender.  Bowel sounds normal.  No masses.  No organomegaly.  Extremities: Extremities normal, atraumatic, no cyanosis or edema  Skin: Skin color, texture, turgor normal.    Lymph nodes:  Cervical, supraclavicular and axillary nodes normal  Neurologic: Non focal grossly    Lab/Radiology/Other Diagnostic Tests:  24-hour labs:    Results for orders placed or performed during the hospital encounter of 09/23/21 (from the past 24 hour(s))   COVID-19 (SARS-COV-2) PCR    Collection Time: 09/23/21 11:03 AM    Specimen: Nasopharyngeal; Flocked Swab   Result Value Ref Range    COVID-19 (SARS-CoV-2) PCR Source FLOCKED SWAB  NASOPHARYNGEAL       COVID-19 (SARS-CoV-2) PCR NOT DETECTED DN-NOT DETECTED   CBC AND DIFF    Collection Time: 09/23/21 11:03 AM   Result Value Ref Range    White Blood Cells 13.9 (H) 4.5 - 11.0 K/UL    RBC 4.80 4.0 - 5.0 M/UL    Hemoglobin 13.7 12.0 - 15.0 GM/DL    Hematocrit 16.1 36 - 45 %    MCV 86.4 80 - 100 FL    MCH 28.6 26 - 34 PG    MCHC 33.1 32.0 - 36.0 G/DL    RDW 09.6 11 - 15 %    Platelet Count 417 (H) 150 - 400 K/UL    MPV 6.8 (L) 7 - 11 FL    Neutrophils 77 41 - 77 %    Lymphocytes 17 (L) 24 - 44 %    Monocytes 5 4 - 12 %    Eosinophils 0 0 - 5 %    Basophils 1 0 - 2 %    Absolute Neutrophil Count 10.78 (H) 1.8 - 7.0 K/UL    Absolute Lymph Count 2.31 1.0 - 4.8 K/UL    Absolute Monocyte Count 0.70 0 - 0.80 K/UL    Absolute Eosinophil Count 0.02 0 - 0.45 K/UL    Absolute Basophil Count 0.09 0 - 0.20 K/UL    MDW (Monocyte Distribution Width) 17.0 <20.7   COMPREHENSIVE METABOLIC PANEL    Collection Time: 09/23/21 11:03 AM   Result Value Ref Range    Sodium 139 137 - 147 MMOL/L    Potassium 3.7 3.5 - 5.1 MMOL/L    Chloride 103 98 - 110 MMOL/L  Glucose 110 (H) 70 - 100 MG/DL    Blood Urea Nitrogen 10 7 - 25 MG/DL    Creatinine 1.61 0.4 - 1.00 MG/DL    Calcium 9.5 8.5 - 09.6 MG/DL    Total Protein 7.3 6.0 - 8.0 G/DL    Total Bilirubin 0.4 0.3 - 1.2 MG/DL    Albumin 4.5 3.5 - 5.0 G/DL    Alk Phosphatase 88 25 - 110 U/L    AST (SGOT) 22 7 - 40 U/L    CO2 22 21 - 30 MMOL/L    ALT (SGPT) 41 7 - 56 U/L    Anion Gap 14 (H) 3 - 12    eGFR >60 >60 mL/min   TSH WITH FREE T4 REFLEX    Collection Time: 09/23/21 11:03 AM   Result Value Ref Range    TSH 1.06 0.35 - 5.00 MCU/ML   URINALYSIS DIPSTICK REFLEX TO CULTURE    Collection Time: 09/23/21  1:55 PM    Specimen: Urine   Result Value Ref Range    Color,UA STRAW     Turbidity,UA CLEAR CLEAR-CLEAR    Specific Gravity-Urine 1.006 1.005 - 1.030    pH,UA 6.0 5.0 - 8.0    Protein,UA NEG NEG-NEG    Glucose,UA NEG NEG-NEG    Ketones,UA NEG NEG-NEG    Bilirubin,UA NEG NEG-NEG    Blood,UA NEG NEG-NEG    Urobilinogen,UA NORMAL NORM-NORMAL    Nitrite,UA NEG NEG-NEG    Leukocytes,UA NEG NEG-NEG    Urine Ascorbic Acid, UA NEG NEG-NEG   URINALYSIS MICROSCOPIC REFLEX TO CULTURE    Collection Time: 09/23/21  1:55 PM    Specimen: Urine   Result Value Ref Range    WBCs,UA 0-2 0 - 2 /HPF    RBCs,UA 0-2 0 - 3 /HPF    Comment,UA       Criteria for reflex to culture are WBC>10, Positive Nitrite, and/or >=+1   leukocytes. If quantity is not sufficient, an addendum will follow.      MucousUA TRACE     Squamous Epithelial Cells 0-2 0 - 5     Glucose: (!) 110 (09/23/21 1103)  Urine Pregnancy: Negative (09/23/21 1429)  Pertinent radiology reviewed.    CT HEAD WO CONTRAST    Result Date: 09/23/2021  1.  No acute intracranial hemorrhage, hydrocephalus, or herniation. 2.  Suprasellar mass measuring up to 1.4 cm consistent with reported macroadenoma, though this is incompletely evaluated on this exam. MRI brain with and without contrast could be obtained for further evaluation if clinically indicated. By my electronic signature, I attest that I have personally reviewed the images for this examination and formulated the interpretations and opinions expressed in this report  Finalized by Dimple Casey, M.D. on 09/23/2021 7:26 PM. Dictated by Meredith Mody, MD on 09/23/2021 7:14 PM.     CHEST SINGLE VIEW    Result Date: 09/23/2021  No acute cardiopulmonary abnormalities with the aforementioned limitations. By my electronic signature, I attest that I have personally reviewed the images for this examination and formulated the interpretations and opinions expressed in this report  Finalized by Lars Mage, M.D. on 09/23/2021 11:50 AM. Dictated by Danella Penton, MBBS on 09/23/2021 11:47 AM.

## 2021-09-24 NOTE — ED Notes
Pt hand off completed with Marlon Pel, RN assuming care of pt on [facility] 802.Pt will be transported to the unit once unit has CO coverage.

## 2021-09-24 NOTE — Consults
PSYCHIATRY CONSULT NOTE    Room/Bed: HC802/01  Admission Date:     09/23/2021                                                LOS: 1 day    Consult type: Co-Management w/Signed Orders   Reason for Consult:      Mental Status Change    Assessment:  1. Schizophrenia  2. Pituitary Adenoma    Recommendations:      ? Given acute psychosis, disorganization, will recommend inpatient psych admission at this time  ? Will discontinue PTA Abilify 30mg  PO daily at this time, guardian states patient received Abilify Maintenna 200mg  IM LAI about 3 weeks ago  ? Consider trial of a different antipsychotic with LAI option (Risperdal) as pt has continued to experience significant symptoms on Abilify  ? SW assistance w/ placement     Reviewed EMR.   Reviewed Laboratory studies.     Seen and discussed with: Dr. Daleen Squibb    Please feel free to contact us with any additional questions or concerns by paging the consult team between 8am and 5pm on weekdays and between 8am and 3pm on weekends at [phone]. Otherwise, page the psychiatry resident on call.  ______________________________________________________________________  Chief Concern:  I have to save everyone    History of Present Illness:   Kristin Maynard is a 26 y.o. female with a past psychiatric history of schizophrenia who presents to [facility] ED brought in by father after they presented [place] as a walk-in for psychosis and referred to the ED for medical clearance.  Psychiatry was consulted for mental status change    Collateral per father who is patient's legal guardian:     Father brought patient in to the ED after patient ran away from the house and was not found without a coat or shoes on the street.  Father states that he is concerned that patient was not fully psychiatrically stable at the time of discharge from [facility].  Father states that patient can make it appear that she is doing well and stable when she is actually suffering and not psychiatrically stable. States that she was at [facility] for 2 weeks for psychosis and family realized that she had not been taking her oral past month.  Father states that patient received Abilify Maintena 200 mg IM long-acting injectable 3 weeks ago however, he states that patient continues to exhibit hallucinations/delusions even with the medication on board.    Patient states that she has been hearing voices that tell her that she must save everyone.  Patient states she was recently at [facility] due to her voices and not feeling well and was discharged on Friday.  Patient states that she was taking Abilify 30 mg daily and received a shot of Abilify maintain a couple of weeks ago.  Patient states she still continues to hear voices that tell her to save everyone and to see the light.  Patient states she was diagnosed with schizophrenia before she was a teenager at a very young age.  She states that her mother has a history of schizophrenia as well.  She denies visual hallucinations denies paranoia, denies depressed mood, endorses contitional SI if she cannot see her family, but denies active intent, or plan and state she feels safe at this hospital, and is  agreeable to inpt psych stay. She also denies HI.    Access to firearms?  Denies    Past Psychiatric History:  Onset: before teen  Outpatient Provider: Unknown  Diagnoses: Schizophrenia  Medications: Abilify Maintenna, Abilify Oral 30mg   Psychiatric Hospitalizations: Most recent at [facility] 2 weeks ago  Past Self-Injurious Behaviors: Denies   Past Suicide Attempts: Denies      Family Psychiatric History:  Reports mother with Schizophrenia    Substance Use:  Caffeine: Denies  Tobacco: Denies  Alcohol: States she drinks 1-2 drinks per month socially  Marijuana: Denies  Cocaine: Denies  Heroin: Denies  Methamphetamines/Stimulants: Denies   Prescription opiates: Denies  Other: Denies      Psychosocial History:  Lives with parents and brother in a house  Currently unemployed  Reports bad childhood due to schizophrenia  Parents are alive  States she has completed a bachelor's degree in exercise/kinesiology    Social History     Socioeconomic History   ? Marital status: Single   Tobacco Use   ? Smoking status: Unknown         Past Medical/Surgical History:  Medical History:   Diagnosis Date   ? Schizophrenia (HCC)    :    No past surgical history on file.:      Home Medications:  Medications Prior to Admission   Medication Sig Dispense Refill Last Dose   ? ARIPiprazole (ABILIFY) 1 mg/mL oral solution Take 30 mL by mouth.      ? bromocriptine (PARLODEL) 5 mg capsule Take five capsules by mouth.          Hospital Medications:  Scheduled Meds:ARIPiprazole (ABILIFY) tablet 30 mg, 30 mg, Oral, QDAY  bromocriptine (PARLODEL) tablet 25 mg, 25 mg, Oral, QHS  traZODone (DESYREL) tablet 50 mg, 50 mg, Oral, QHS    Continuous Infusions:  PRN and Respiratory Meds:acetaminophen Q6H PRN, guaiFENesin  (ROBITUSSIN) oral solution Q4H PRN, melatonin QHS PRN, ondansetron Q6H PRN **OR** ondansetron (ZOFRAN) IV Q6H PRN, polyethylene glycol 3350 QDAY PRN, senna/docusate QDAY PRN  :    Allergies:  Patient has no known allergies.      Review of Systems:  A 14 point review of systems was negative except for: Behvioral/Psych: positive for Auditory hallucinations and sleep disturbance      Vital Signs:  Last Filed in 24 hours Vital Signs:  24 hour Range    BP: 148/94 (03/12 1300)  Temp: 37 ?C (98.6 ?F) (03/12 1300)  Pulse: 96 (03/12 1300)  Respirations: 16 PER MINUTE (03/12 0913)  SpO2: 98 % (03/12 1300)  O2 Device: None (Room air) (03/12 1300)  SpO2 Pulse: 97 (03/11 2144) BP: (123-153)/(75-107)   Temp:  [36.8 ?C (98.2 ?F)-37.1 ?C (98.7 ?F)]   Pulse:  [94-130]   Respirations:  [16 PER MINUTE-18 PER MINUTE]   SpO2:  [96 %-98 %]   O2 Device: None (Room air)     MENTAL STATUS EXAMINATION  General/Constitutional: appears stated age, dressed in personal clothes, fair grooming  Eye Contact: intense  Behavior: Calm, cooperative; appropriate for conversation  Speech: RRR with normal volume and tone. Good articulation  Mood: okay  Affect: blunted, ; mood congruent  Thought Process: Linear and goal directed  Thought Content: denies active SI, HI. No evidence of delusions  Perception: Endorses AH, denies VH, paranoia  Associations: loose  Insight/Judgment: limited/poor      Physical Exam:  Neuro: No gross neurologic deficits.   Musculoskeletal: Moves all four extremities spontaneously  ?  Lab/Radiology/Other Diagnostic Tests:  24-hour labs:    Results for orders placed or performed during the hospital encounter of 09/23/21 (from the past 24 hour(s))   CORTISOL,RANDOM    Collection Time: 09/24/21  3:30 AM   Result Value Ref Range    Cortisol, Random 13.1 5.0 - 20.0 MCG/DL   CBC    Collection Time: 09/24/21  3:31 AM   Result Value Ref Range    White Blood Cells 12.2 (H) 4.5 - 11.0 K/UL    RBC 4.50 4.0 - 5.0 M/UL    Hemoglobin 12.9 12.0 - 15.0 GM/DL    Hematocrit 09.8 36 - 45 %    MCV 87.7 80 - 100 FL    MCH 28.8 26 - 34 PG    MCHC 32.8 32.0 - 36.0 G/DL    RDW 11.9 11 - 15 %    Platelet Count 391 150 - 400 K/UL    MPV 6.9 (L) 7 - 11 FL   COMPREHENSIVE METABOLIC PANEL    Collection Time: 09/24/21  3:31 AM   Result Value Ref Range    Sodium 141 137 - 147 MMOL/L    Potassium 3.9 3.5 - 5.1 MMOL/L    Chloride 105 98 - 110 MMOL/L    Glucose 99 70 - 100 MG/DL    Blood Urea Nitrogen 10 7 - 25 MG/DL    Creatinine 1.47 0.4 - 1.00 MG/DL    Calcium 9.2 8.5 - 82.9 MG/DL    Total Protein 6.6 6.0 - 8.0 G/DL    Total Bilirubin 0.4 0.3 - 1.2 MG/DL    Albumin 4.1 3.5 - 5.0 G/DL    Alk Phosphatase 78 25 - 110 U/L    AST (SGOT) 17 7 - 40 U/L    CO2 25 21 - 30 MMOL/L    ALT (SGPT) 35 7 - 56 U/L    Anion Gap 11 3 - 12    eGFR >60 >60 mL/min   MAGNESIUM    Collection Time: 09/24/21  3:31 AM   Result Value Ref Range    Magnesium 2.0 1.6 - 2.6 mg/dL       Elvin So

## 2021-09-24 NOTE — Progress Notes
1256: pt presented to call center for admission to [facility]

## 2021-09-24 NOTE — Progress Notes
This therapist called legal guardian, Cotina Freedman, to confirm consent to refer pt to [facility]. Corky Downs agreed to sending a referral to [facility].

## 2021-09-25 ENCOUNTER — Encounter: Admit: 2021-09-25 | Discharge: 2021-09-25 | Payer: BC Managed Care – HMO

## 2021-09-25 NOTE — Progress Notes
Behavioral Health Services  Integrated Case Management/Therapy Assessment    NAME:Kristin Maynard MRN: [id number] DOB:[birth year] AGE: 26 y.o.  ADMISSION DATE: 09/23/2021 DAYS ADMITTED: LOS: 2 days    Date of Service: 09/25/21    Principal Problem:    Schizophrenia (HCC)    Reason for Admission: Altered mental status    Patient's Description of Problem: Schizophrenia and heart problems    Recent Changes or Stressors: Pt reported no new stressors or changes.    Guardian/Involuntary: Pts mom and dad are guardians.    Living Situation: Pt lives with her dad.    Placement upon discharge  Patient's comments: Pt is agreeable to inpatient psych.    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 Pt also reports being at [facility] a few years ago.     Prior Residential Placements  N/A    Case Management Comments:       Trauma History: Per chart review and patient report, Pt did not report any trauma.    Substance Use:  Pt denies    Family History:  Family history of mental health diagnosis? Pt reported that her mom has depression.  Family history of substance use? Pt stated that her father sometimes drinks alcohol.    Internal Strengths: Pt reported that she enjoys writing poetry.    External Supports: Pts family and friends are supportive.     Limitations:     Therapy Aftercare Recommendations: .    Therapy Comments:     Mental Status Exam    Observations:  Appearance: neat  Speech: delayed  Eye Contact: avoidant    Behavior: slowed    Mood: depressed  Affect: Flat    Cognition:  Orientation impairment: Pt reported that she thought she tried to commit suicide last night while in the hospital and several times while at [facility]. Pt stated that she was unsure what she did and could not remember the details.    Memory Impairment: no noted impairment    Thought Process: WNL and disorganized    Current Safety Concerns:   Current Safety Concerns - Self: suicidal ideation  Current Safety Concerns - Others: none  Psychoses: none  Delusions: none  Lethal Means: Patient does not report any access to firearms.    Insight: Poor  Judgment: Poor

## 2021-09-25 NOTE — Progress Notes
General Progress Note    Name:  Kristin Maynard   ZOXWR'[id number] Date:  09/25/2021  Admission Date: 09/23/2021  LOS: 2 days                     Assessment/Plan:    Principal Problem:    Schizophrenia (HCC)      Pituitary macroadenoma  Hyperprolactinemia  Amenorrhea  - CT head: Suprasellar mass measuring up to 1.4 cm consistent with reported macroadenoma  - Patient had no headaches. No vision problems or changes  - continue PTA bromocriptine  ?  Sinus tachycardia: Improving  Leukocytosis: Improved  - HR 100-130's  - ECG: sinus tachycardia, normal QTC  - UA and chest xray negative   -Suspect tachycardia due to anxiety and agitation  -Encourage oral fluids  Monitor  ?  Schizophrenia  - UDS ; negative  - psych consulted   - CO at bedside  -Social work on board to assist with inpatient psych placement    Fen  IV fluids;none  Renal diet  Monitor lites    Dispo: Patient is medically stable for inpatient psych placement when bed available  ________________________________________________    _____________________________________________________________________    Subjective  Kristin Maynard is a 26 y.o. female.  No acute overnight events.  Patient reports doing fine this morning.  Patient states she had left-sided chest pain shortness of breath and palpitations and felt like an anxiety attack.  Continues to have hallucinations.  Also reporting passive suicidal ideations.  Medications  Scheduled Meds:bromocriptine (PARLODEL) tablet 25 mg, 25 mg, Oral, QHS  traZODone (DESYREL) tablet 50 mg, 50 mg, Oral, QHS    Continuous Infusions:  PRN and Respiratory Meds:acetaminophen Q6H PRN, guaiFENesin  (ROBITUSSIN) oral solution Q4H PRN, melatonin QHS PRN, ondansetron Q6H PRN **OR** ondansetron (ZOFRAN) IV Q6H PRN, polyethylene glycol 3350 QDAY PRN, senna/docusate QDAY PRN      Review of Systems:  A 14 point review of systems was negative except for: Behvioral/Psych: positive for Hallucinations    Objective: Vital Signs: Last Filed                 Vital Signs: 24 Hour Range   BP: 125/95 (03/13 0806)  Temp: 36.7 ?C (98.1 ?F) (03/13 0454)  Pulse: 109 (03/13 0806)  Respirations: 18 PER MINUTE (03/13 0806)  SpO2: 93 % (03/13 0806)  O2 Device: None (Room air) (03/13 0806)  SpO2 Pulse: 95 (03/13 0424) BP: (125-154)/(87-95)   Temp:  [36.4 ?C (97.6 ?F)-36.7 ?C (98.1 ?F)]   Pulse:  [95-119]   Respirations:  [16 PER MINUTE-18 PER MINUTE]   SpO2:  [93 %-97 %]   O2 Device: None (Room air)     Vitals:    09/23/21 1034 09/23/21 2120   Weight: 88.5 kg (195 lb) 88.1 kg (194 lb 3.6 oz)       Intake/Output Summary:  (Last 24 hours)    Intake/Output Summary (Last 24 hours) at 09/25/2021 1413  Last data filed at 09/25/2021 1045  Gross per 24 hour   Intake 1036 ml   Output --   Net 1036 ml           Physical Exam    General: AO3x, no  distress   Head: NCAT  Eyes: PERRLA EOMI,   ENT: Normal external canals b/l  Neck: supple   Lungs: CTAB  Chest wall: No tenderness,   Heart: RRR, no murmurs.  Abdomen: soft ,non-tender, bs+  Extremities:no edema b/l  Peripheral pulses: 2+  and symmetric, all extremities  Skin: dry  Lymph nodes: No cervical LAD  Neurologic:grossly normal   Musculoskeletal: ntd  Lab Review  24-hour labs:    Results for orders placed or performed during the hospital encounter of 09/23/21 (from the past 24 hour(s))   CBC    Collection Time: 09/25/21  2:20 AM   Result Value Ref Range    White Blood Cells 11.0 4.5 - 11.0 K/UL    RBC 4.56 4.0 - 5.0 M/UL    Hemoglobin 13.1 12.0 - 15.0 GM/DL    Hematocrit 42.5 36 - 45 %    MCV 86.9 80 - 100 FL    MCH 28.8 26 - 34 PG    MCHC 33.1 32.0 - 36.0 G/DL    RDW 95.6 11 - 15 %    Platelet Count 385 150 - 400 K/UL    MPV 6.9 (L) 7 - 11 FL   COMPREHENSIVE METABOLIC PANEL    Collection Time: 09/25/21  2:20 AM   Result Value Ref Range    Sodium 139 137 - 147 MMOL/L    Potassium 3.8 3.5 - 5.1 MMOL/L    Chloride 103 98 - 110 MMOL/L    Glucose 106 (H) 70 - 100 MG/DL    Blood Urea Nitrogen 16 7 - 25 MG/DL Creatinine 3.87 0.4 - 5.64 MG/DL    Calcium 9.5 8.5 - 33.2 MG/DL    Total Protein 6.6 6.0 - 8.0 G/DL    Total Bilirubin 0.4 0.3 - 1.2 MG/DL    Albumin 4.2 3.5 - 5.0 G/DL    Alk Phosphatase 83 25 - 110 U/L    AST (SGOT) 15 7 - 40 U/L    CO2 24 21 - 30 MMOL/L    ALT (SGPT) 32 7 - 56 U/L    Anion Gap 12 3 - 12    eGFR >60 >60 mL/min   MAGNESIUM    Collection Time: 09/25/21  2:20 AM   Result Value Ref Range    Magnesium 2.0 1.6 - 2.6 mg/dL       Point of Care Testing  (Last 24 hours)  Glucose: (!) 106 (09/25/21 0220)    Radiology and other Diagnostics Review:    Pertinent radiology reviewed.   CT HEAD WO CONTRAST    Result Date: 09/23/2021  1.  No acute intracranial hemorrhage, hydrocephalus, or herniation. 2.  Suprasellar mass measuring up to 1.4 cm consistent with reported macroadenoma, though this is incompletely evaluated on this exam. MRI brain with and without contrast could be obtained for further evaluation if clinically indicated. By my electronic signature, I attest that I have personally reviewed the images for this examination and formulated the interpretations and opinions expressed in this report  Finalized by Dimple Casey, M.D. on 09/23/2021 7:26 PM. Dictated by Meredith Mody, MD on 09/23/2021 7:14 PM.       Otto Herb, MD   Pager [phone]

## 2021-09-25 NOTE — Progress Notes
Pt has been very calm and cooperative. She states often that she would like to go home but is easy to redirect. She also stated that she " likes the Psychiatric team and would like for them to  visit more often ".

## 2021-09-25 NOTE — Progress Notes
Psychiatric Consultation Progress Note    LOS: 2 days    Current Psychotropic Meds: Abilify Maintenna 200mg  IM (first dose 3 weeks ago)    Psychiatric Assessment:  1. Schizophrenia  2. Pituitary Adenoma    Recommendations:  ? Patient continues to be acutely psychotic with persistent auditory and visual hallucinations, delusions, appears to be attending to internal stimuli  ? Recommend inpatient psychiatric hospitalization  ? Patient currently voluntary for admission, has capacity to sign herself into psychiatric hospital  ? Check prolactin level given history of pituitary adenoma  ? Start Risperidone 1mg  qhs if prolactin level within normal limits    Cannot leave AMA without first being reassessed.        Seen and discussed with: Dr. Erick Colace    Please feel free to contact us with any additional questions or concerns by paging the consult team between 8am and 5pm on weekdays and between 8am and 3pm on weekends at [phone]. Otherwise, page the psychiatry resident on call.    ATTESTATION    I personally performed or re-performed the history, physical exam and treatment for the E/M. I discussed the case with the Medical Student, and concur with the Medical Student documentation of history, physical exam and treatment plan unless otherwise noted.     Staff name:  Rutherford Limerick, DO Date of Service:  09/25/2021         --------------------------------------------------------------------------------------------------------------------------------  Subjective:  Kristin Maynard seen in her room this morning, reports her mood is okay, I guess. Patient reports unchanged auditory and visual hallucinations last night and this morning. She reports hearing voices telling her to save everyone. Patient states that she must save everyone from themselves and from their temptations. She additionally reports seeing threats and temptations in the room, including during this interview. She describes the temptations as random body parts in her environment. Patient reports that these hallucinations have been constantly present over the last month, and says that her current medication regimen of Abilify has not been helpful.     Discussed with patient that she would benefit from inpatient psychiatric hospitalization, and patient voices understanding and agreement with this plan. Patient is able to state why she needs psychiatric care, claiming the voices scare me, I need to get help. Patient denies depression, but reports increased anxiety related to her hallucinations. She denies pervasive anxiety related to many aspects of her life.    Patient denies SI/HI in the hospital.    Review of Systems   Constitutional: Negative for malaise/fatigue.   Respiratory: Negative for shortness of breath.    Cardiovascular: Negative for chest pain.   Gastrointestinal: Negative for abdominal pain.   Neurological: Negative for dizziness.   Psychiatric/Behavioral: Positive for hallucinations. Negative for depression, memory loss, substance abuse and suicidal ideas. The patient is nervous/anxious and has insomnia.         Objective:                   Vital Signs:  Current                Vital Signs: 24 Hour Range   BP: 125/95 (03/13 0806)  Temp: 36.7 ?C (98.1 ?F) (03/13 1610)  Pulse: 109 (03/13 0806)  Respirations: 18 PER MINUTE (03/13 0806)  SpO2: 93 % (03/13 0806)  O2 Device: None (Room air) (03/13 0806)  SpO2 Pulse: 95 (03/13 0424) BP: (125-154)/(87-95)   Temp:  [36.4 ?C (97.6 ?F)-37 ?C (98.6 ?F)]   Pulse:  [95-119]  Respirations:  [16 PER MINUTE-18 PER MINUTE]   SpO2:  [93 %-98 %]   O2 Device: None (Room air)    Intensity Pain Scale (Self Report): (not recorded)      Scheduled Medications:  bromocriptine (PARLODEL) tablet 25 mg, 25 mg, Oral, QHS  traZODone (DESYREL) tablet 50 mg, 50 mg, Oral, QHS        PRN Medications:  acetaminophen Q6H PRN, guaiFENesin  (ROBITUSSIN) oral solution Q4H PRN, melatonin QHS PRN 5 mg at 09/24/21 2146, ondansetron Q6H PRN **OR** ondansetron (ZOFRAN) IV Q6H PRN, polyethylene glycol 3350 QDAY PRN, senna/docusate QDAY PRN      Mental Status Exam:  ? General/Constitutional: In hospital attire, in hospital bed  ? Speech/Motor: low volume, monotone, non-pressured  ? Mood/Affect: okay, flat affect, at times intense/unbreaking eye-contact  ? Thought Process: linear, goal-oriented  ? Associations: intact  ? Thought Content: denies SI/HI, some thought blocking  ? Perception: ongoing auditory and visual hallucinations, voices saying save everyone, visions of severed limbs representing temptations. At times appears attending to internal stimuli  ? Insight/Judgment: poor/poor    ? Orientation: oriented x4  ? Recent and Remote Memory: grossly intact  ? Attention span and concentration: limited  ? Language: fluent English  ? Fund of knowledge and vocabulary: average    Focused Physical Exam:  ? Musculoskeletal: no gross deformities or edema  ? Neuro: no abnormal involuntary movements       Eula Flax MS4

## 2021-09-25 NOTE — Progress Notes
1026: pt represented to [facility]

## 2021-09-25 NOTE — Progress Notes
Pt stated she slept " O.K " last night. She was up eating breakfast and asked to go for a walk around the unit. She appears to be calm and cooperative.She continues to have a flat affect and a bit slow to respond, but oriented . She asked to call family later.

## 2021-09-26 ENCOUNTER — Encounter: Admit: 2021-09-26 | Discharge: 2021-09-26 | Payer: BC Managed Care – HMO

## 2021-09-26 DIAGNOSIS — F209 Schizophrenia, unspecified: Secondary | ICD-10-CM

## 2021-09-26 MED ORDER — OLANZAPINE 10 MG IM SOLR
5 mg | INTRAMUSCULAR | 0 refills | Status: DC | PRN
Start: 2021-09-26 — End: 2021-10-09

## 2021-09-26 MED ORDER — CALCIUM CARBONATE 200 MG CALCIUM (500 MG) PO CHEW
1000 mg | Freq: Three times a day (TID) | ORAL | 0 refills | Status: DC | PRN
Start: 2021-09-26 — End: 2021-11-02
  Administered 2021-11-02: 18:00:00 1000 mg via ORAL

## 2021-09-26 MED ORDER — HYDROXYZINE HCL 25 MG PO TAB
25 mg | ORAL | 0 refills | Status: DC | PRN
Start: 2021-09-26 — End: 2021-11-02
  Administered 2021-09-27 – 2021-10-29 (×16): 25 mg via ORAL

## 2021-09-26 MED ORDER — OLANZAPINE 5 MG PO TBDI
5 mg | ORAL | 0 refills | Status: DC | PRN
Start: 2021-09-26 — End: 2021-10-02
  Administered 2021-09-26 – 2021-10-02 (×10): 5 mg via ORAL

## 2021-09-26 NOTE — Progress Notes
Case Management Progress Note    NAME:Zyah Brandyce Dimario MRN: [id number] DOB:1996-05-10 AGE: 26 y.o.  ADMISSION DATE: 09/23/2021 DAYS ADMITTED: LOS: 3 days     Date of Service: 09/26/2021  Service Start Time:  3:24pm  Service End Time:  3:27pm    CM briefly met with patient on the unit to check-in and discuss case management needs. Patient presents with blunted affect. CM introduced self to patient as well as this writer's role at the hospital. Patient verbalized understanding and states she used to see Chinita Pester at the [facility] prior to admitting to [facility]. Patient agreeable to engaging in these services again upon discharge and [place] in [place]. CM to reach out to the [facility] to coordinate. Patient gave verbal permission to release information to her parents, [name] and Venetia Prewitt, around 3:25pm. Staff will follow up with written authorization. CM ensured any additional questions/concerns were addressed prior to ending meeting. Will continue to meet with patient as needed to work on discharge and safety planning.

## 2021-09-26 NOTE — Consults
Admission Date: 09/23/2021                                             LOS: 3    Principal Problem:    Schizophrenia (HCC)  Active Problems:    Pituitary macroadenoma (HCC)    Hyperprolactinemia (HCC)    Elevated BP without diagnosis of hypertension    Sinus tachycardia    Leukocytosis    Reason for Consult: Medical evaluation     Consult type: Co-Management w/Signed Orders    Impression    Kristin Maynard is a 26 y.o. female admitted to [facility] for acute psychosis. The patient has the following medical problems:     Acute psychosis, disorganization, schizophrenia   - Originally presented to the [facility] ED with her father for psychosis, but transported to the [place] ED for medical clearance. Admitted to the main campus 3/11 - 3/14  - Parents are her legal guardians   - On Abilify maintenance (last dose 3 wks ago) with refractory symptoms    Pituitary macroadenoma  Hyperprolactinemia  Amenorrhea, secondary   Metabolic syndrome   - Follows with Dr. Threasa Beards of [facility]   - CT head 3/11:?Suprasellar mass measuring up to 1.4cm consistent with reported macroadenoma  - Prolactin 3/13: 981.9; baseline not available on Care Everywhere   -?Reports intermittent HAs, denies vision changes   - PTA bromocriptine 25mg  daily, OCP is PTA VOLNEA 1 tablet daily     Intermittent elevated BPs?  Sinus tachycardia - improving   Leukocytosis - improving   - HR 90-120s, BP 140/90s  - EKG 3/11: ST, HR 109, QTc 444  - UA and CXR unremarkable on admit    - Denies fever, chills  - Suspect findings are due to agitation and reactive  - ?PTA Metoprolol 25mg  daily listed - not currently taking     Constipation     Dysuria, urinary frequency   - Reports new dysuria, urinary frequency x1 day   - Denies hx of UTIs  - 2/4 SIRS as above     Chest pain  - Reports tense, right-sided chest pain and palpitations for a few weeks, worse with deep breaths  - EKG as above   - Likely MS vs. anxiety related       Recommendations    > Management of the patient's psychiatric illnesses per psychiatry   > Cont PTA bromocriptine. Will substitute our formulary BC for her PTA VOLNEA  > Cont Tylenol PRN for intermittent HAs  > Monitor HR and BP per protocol. Attempted to call her father to gather more info about the metop listed on her PTA medication list - no answer. Will try again this afternoon. IM will follow her VS. Low threshold to start low dose metoprolol if her HR and BP remain elevated   > Bowel regimen available PRN  > Will repeat UA given her new symptoms of dysuria and urinary frequency  > Chest pain likely MS vs. anxiety related. Can trial a lidocaine patch to the R chest wall      The patient is admitted to inpatient psychiatric unit. The inpatient psychiatry team is following. Internal medicine team was consulted for medical evaluation.  I reviewed patient medical history, vitals, medications, labs and available medical records.  Thank you for the consult, the patient is currently medically stable; we will be happy to  see the patient again if needed      Internal medicine can be reached out by Looking up [facility] on Voalte or Cureatr app for direct app communication    or by paging [phone]      Anna Genre, APRN, NP-C  Internal Medicine Consults    Patient discussed with Dr. Zenaida Niece   ____________________________________________________________________    History of Present Illness: Kristin Maynard is a 26 y.o. female admitted for psychosis. Patient seen in [facility]. She was calm and cooperative this morning. Reports she is familiar with the medications she takes at home. Notes she is on bromocriptine and BC. Unsure if she is still on metop. Thinks she may have been on this at [facility], but did not continue it at home when she was discharged. Reports dyspnea, chest pain, and palpitations this morning. Describes her chest pain as tense and right-sided; present for a few weeks and worse with deep breaths. Denies abdominal pain, N/V/D. Reports some constipation. Also reports new dysuria and urinary frequency that started last night. Denies hx of UTIs, fever, chills. Reports intermittent HAs.     Review of Systems:  14 point ROS performed and negative with exception of dyspnea, chest pain, and palpitations, constipation, dysuria and urinary frequency, intermittent HAs.       Medical History:   Diagnosis Date   ? Amenorrhea, secondary    ? Hyperprolactinemia (HCC)    ? Metabolic syndrome    ? Pituitary macroadenoma (HCC)    ? Schizophrenia (HCC)      History reviewed. No pertinent surgical history.    Social History     Socioeconomic History   ? Marital status: Single   Tobacco Use   ? Smoking status: Unknown   Vaping Use   ? Vaping Use: Never used       History reviewed. No pertinent family history.      Allergies:  No Known Allergies    Scheduled Meds:  MEDSbromocriptine, 25 mg, Oral, QHS  traZODone, 50 mg, Oral, QHS     IV MEDS  Prn acetaminophen Q6H PRN, calcium carbonate TID PRN, hydrOXYzine Q6H PRN, melatonin QHS PRN 5 mg at 09/24/21 2146, OLANZapine Q6H PRN 5 mg at 09/26/21 1438 **OR** OLANZapine Q6H PRN, polyethylene glycol 3350 QDAY PRN, senna/docusate QDAY PRN     HOME MEDS  Medications Prior to Admission   Medication Sig   ? ABILIFY MAINTENA 400 mg injectable VIAL Inject four hundred mg into the muscle every 28 days.   ? ARIPiprazole (ABILIFY) 1 mg/mL oral solution Take 30 mL by mouth.   ? ARIPiprazole (ABILIFY) 30 mg tablet Take one tablet by mouth daily.   ? bromocriptine (PARLODEL) 5 mg capsule Take five capsules by mouth.   ? fluPHENAZine (PROLIXIN) 1 mg tablet Take one tablet by mouth daily.   ? metoprolol tartrate 25 mg tablet Take one tablet by mouth daily.   ? VOLNEA (28) 0.15-0.02 mgx21 /0.01 mg x 5 tablet Take one tablet by mouth daily.            Vital Signs: Last Filed In 24 Hours Vital Signs: 24 Hour Range   BP: 143/101 (03/14 1421)  Temp: 36.5 ?C (97.7 ?F) (03/14 1421)  Pulse: 114 (03/14 1421)  Respirations: 17 PER MINUTE (03/14 1421)  SpO2: 98 % (03/14 1421)  O2 Device: None (Room air) (03/14 1421)  Height: 157.5 cm (5' 2) (03/14 1421) BP: (129-146)/(82-101)   Temp:  [36.5 ?C (97.7 ?F)-36.7 ?C (  98 ?F)]   Pulse:  [108-122]   Respirations:  [17 PER MINUTE-18 PER MINUTE]   SpO2:  [96 %-98 %]   O2 Device: None (Room air)       Physical Exam:  General: Alert and oriented, cooperative and no distress   Head: Normocephalic, atraumatic, without obvious abnormality  Eyes:?Conjunctivae/corneas clear  Throat: Lips, mucosa, and tongue moist   Neck: Symmetrical  Back: Symmetric, no curvature. ROM normal  Lungs: Clear to auscultation bilaterally, no wheezes, rales, rhonchi   Heart: Regular rate and rhythm, S1, S2 normal, no murmur, click, rub or gallop   Abdomen:?Bowel sounds present, non-tender, no organomegaly  Extremities: Pulses palpable, no pedal edema or skin lesions   Neurologic: Grossly normal, alert and oriented X 3, normal strength and tone  Skin:?Skin color, texture, turgor normal. No rashes or lesions       Lab/Radiology/Other Diagnostic Tests:  Recent Labs     09/24/21  0331 09/25/21  0220 09/26/21  0220   NA 141 139 138   K 3.9 3.8 4.1   CL 105 103 101   CO2 25 24 25    GAP 11 12 12    BUN 10 16 15    CR 0.67 0.64 0.74   GLU 99 106* 95   CA 9.2 9.5 9.4   ALBUMIN 4.1 4.2 4.3   MG 2.0 2.0 2.0       Recent Labs     09/24/21  0331 09/25/21  0220 09/26/21  0220   WBC 12.2* 11.0 12.4*   HGB 12.9 13.1 13.6   HCT 39.4 39.6 40.5   PLTCT 391 385 414*   AST 17 15 19    ALT 35 32 33   ALKPHOS 78 83 85      Estimated Creatinine Clearance: 116.8 mL/min (based on SCr of 0.74 mg/dL).  Vitals:    09/23/21 1034 09/23/21 2120 09/26/21 1421   Weight: 88.5 kg (195 lb) 88.1 kg (194 lb 3.6 oz) 86.9 kg (191 lb 9.6 oz)    No results for input(s): PHART, PO2ART in the last 72 hours.    Invalid input(s): PC02A        Pertinent radiology reviewed.      Anna Genre, APRN, NP-C  Pager: [phone]

## 2021-09-26 NOTE — Progress Notes
NURSE TO NURSE REPORT   NAME:Kristin Maynard             MRN: [id number]             DOB:1995/12/02            AGE: 26 y.o.  Gender:  Female    Service: [facility] Adult Psych A    Allergies:No Known Allergies    Name/Title of person providing information: Danielle, Arboriculturist of Patient: [place] med floor     Chief Complaint:    Per Dr. Janean Sark most recent note: "acutely psychotic with persistent auditory and visual hallucinations, delusions, appears to be attending to internal stimuli"  Initially had been expressing suicidal ideation: altered mental status, behavioral changes.     Legal status: Patient voluntary for admission to [facility].      Medications and Compliance:  bromocriptine (PARLODEL) tablet 25 mg, Oral, QHS  traZODone (DESYREL) tablet 50 mg, Oral, QHS     Most Recent Vitals:  Per Andee Poles, RN: no treatment indicated for tachycardia/hypertension  Pulse: (!) 122  BP: (!) 146/93  Respirations: 18 PER MINUTE (Room air)  SpO2: 97 %    Medical Issues and Diagnosis:  Schizophrenia  Pituitary macroadenoma   No acute intracranial hemorrhage, hydrocephalus, or herniation.   Hyperprolactinemia  Amenorrhea  Sinus tachycardia  Leukocytosis    COVID PCR result:  negative      Medical Interventions Needed or PRN's:  Eating/Drinking? yes  Ambulating? yes  Urinating? yes  Skin concerns? yes     ETA? TBD

## 2021-09-26 NOTE — Progress Notes
NURSING ADMISSION NOTE  NAME:Kristin Maynard             MRN: [id number]             DOB:26-Mar-1996            AGE: 26 y.o.  Gender:  Female  ADMISSION DATE: 09/23/2021            Service: [facility] Adult Psych A    Allergies:  No Known Allergies    Chief Complaint:  to get better enough to go to [facility].    Patient presents to [facility] from [facility] Inpatient Medicine Unit (comment which unit). Patient alert and oriented to person, place, and situation. Patient withdrawn, demonstrating misperceptions of immediate environment. Patient avoiding eye contact with assessor, not observed to be responding to any internal stimuli. Patient giving short answers to assessment questions, unable to give more detail on many.      Patient disheveled, able to change clothes on own but required some direction from staff as to what order to put on clothes.     Patient exhibiting flat affect, incongruent mood; reporting visual and auditory hallucinations.  Patient reports auditory voices telling her to save them. Patient reports self-harming while admitted by digging her nails into her hands.     Patient exhibiting tachycardia and hypertension within same levels as when admitted to medical hospital.  No medication given there.     Patient educated on medication compliance, planned management by [facility] providers. Patient not able to identify current treatment regimen. Patient given PRN dose of Zydis 5mg  SL for ongoing psychosis. Patient educated on Zyprexa and its effects; discussed hallucinations. Patient demonstrated some ability to identify hallucinations, denies any at current.    Patient voluntary for admission to [facility] for inpatient hospitalization for medication management and stabilization.      Vitals  BP: 143/101 (03/14 1421)  Temp: 36.5 ?C (97.7 ?F) (03/14 1421)  Pulse: 114 (03/14 1421)  Respirations: 17 PER MINUTE (03/14 1421)  SpO2: 98 % (03/14 1421)  O2 Device: None (Room air) (03/14 1421)  Height: 157.5 cm (5' 2) (03/14 1421)    Suicide Risk Initial Screening:  Suicide Risk - [place] Suicide Severity Rating Scale  1. In the past month have you wished you were dead or wished you could go to sleep and not wake up?: Yes  2. In the past month have you actually had any thoughts of killing yourself?: Yes  3. In the past month have you been thinking about how you might kill yourself?: Yes  4. In the past month have you had these thoughts and had some intention of acting on them?: Yes  5. In the past month have you started to work out or worked out the details of how to kill yourself? Do you intend to carry out this plan?: No  6. In your lifetime have you ever done anything, started to do anything, or prepared to do anything to end your life?: Yes  6a. How long ago did you do any of these?: Within the last three months  Suicide Risk Level: High Risk :     Mental Status Exam  Legal Status: Voluntary Admission  General Appearance: Disheveled, Worried  Mood / Affect: Flat affect, Depressed mood  Speech: Soft  Content Of Thought: Auditory Hallucinations, Other Hallucinations, Guarded  Motor Activity: Normal  Flow of Thought: Blocking, Restricted, Tangential  Sensorium: Orientation to person  Insight / Judgment:  Poor judgment, Poor insight  Behavior: Follows commands, Withdrawn  Patient Strengths: Motivation and readiness for change         AUDIT-C  How Often Drink w/ Alcohol?: Monthly or less  # Drinks w/ Alcohol In Typical Day?: 1 or 2  How Often 6+ Drinks Per Occasion?: Never  AUDIT-C Total Score: 1    Contraband/Body Checks  Patient Searched: Teacher, adult education  Program Orientation: Program Handout, Patient's Rights - Verbal, Telephone, Patient's Rights - Written  Patient's Rights Given/Reviewed: Yes    Substance Abuse History:  Social History     Tobacco Use   ? Smoking status: Unknown   Vaping Use   ? Vaping Use: Never used     Social History     Substance and Sexual Activity   Drug Use Not on file       Social History:   Marital Status: Single    Living Situation: Lives with family            Medical History:   Diagnosis Date   ? Schizophrenia (HCC)      No past surgical history on file.  Patient has no known allergies.    Medication reconciliation/preferred outpatient pharmacy:  Medications Prior to Admission   Medication Sig Dispense Refill Last Dose   ? ABILIFY MAINTENA 400 mg injectable VIAL Inject four hundred mg into the muscle every 28 days.   Past Month   ? ARIPiprazole (ABILIFY) 1 mg/mL oral solution Take 30 mL by mouth.   Not at Home   ? ARIPiprazole (ABILIFY) 30 mg tablet Take one tablet by mouth daily.   09/21/2021   ? bromocriptine (PARLODEL) 5 mg capsule Take five capsules by mouth.   09/21/2021   ? fluPHENAZine (PROLIXIN) 1 mg tablet Take one tablet by mouth daily.   09/21/2021   ? metoprolol tartrate 25 mg tablet Take one tablet by mouth daily.   Not Started   ? VOLNEA (28) 0.15-0.02 mgx21 /0.01 mg x 5 tablet Take one tablet by mouth daily.   09/21/2021       [facility]  Phone: [phone] Fax: [phone]    If there were discrepancies between pharmacy and other source please describe:    Gwendalyn Ege  09/26/2021

## 2021-09-26 NOTE — ED Notes
Patient accepted to [facility] call center to speak with floor nurse for admission

## 2021-09-26 NOTE — Progress Notes
Psychotherapy Progress Note    NAME:Kristin Maynard MRN: [id number] DOB:10-Aug-1995 AGE: 26 y.o.  ADMISSION DATE: 09/23/2021 DAYS ADMITTED: LOS: 3 days    Date of Service:  09/26/21    Principal Problem:    Schizophrenia ([place])    Objective:  [name] will identify at least two symptoms caused by mental illness and/or substance use and include in safety plan.    Narrative:  Follow-up with patient in red zone without other patients present.    It is reported that patient functioning is unchanged since admission.    Provider introduced self to pt and role in treatment team. Pt reports not meeting with counselor/therapist in past. Review of confidentiality and its limits.    Pt reports no counseling goals at this time.    Follow up:  Patient to continue treatment. Provider will follow-up with team about needs and/or concerns.    Time spent on this service is approximately 10 minutes.

## 2021-09-26 NOTE — Progress Notes
Pt arrived to floor via w/c with [employer] escort. Pt pleasant and smiling during introduction. Pt ambulated with steady even gait to her room, provided personal hygiene box, offered, then provided pt with sandwich, bag of chips and 2 cups of water/poweraid. Pt was A/Ox3, denied current SI/HI-stated "not right now", reported faint AH, unable to identify what was being said, denied VH currently but reports having them at least daily if not more frequently. Pt reported not having her reading glasses with her, not sure where they were left (main campus/home). Pt has remained pleasant and cooperative, eating dinner at 100% with fluids.

## 2021-09-26 NOTE — Care Plan
Problem: Skin Integrity  Goal: Skin integrity intact  Outcome: Goal Ongoing  Goal: Healing of skin (Wound & Incision)  Outcome: Goal Ongoing  Goal: Healing of skin (Pressure Injury)  Outcome: Goal Ongoing     Problem: Harm to self, high risk of suicide  Goal: Absence of Harm to Self  Outcome: Goal Ongoing     Problem: Elopement Prevention  Goal: Patients without Decisional Capacity will not elope from the care setting  Outcome: Goal Ongoing     Problem: High Fall Risk  Goal: High Fall Risk  Outcome: Goal Ongoing     Problem: Mood - Altered  Goal: Stabilize mood  Outcome: Goal Ongoing  Goal: Knowledge of Altered Mood  Outcome: Goal Ongoing     Problem: Self-esteem - Low  Goal: Demonstration of positive self-esteem  Outcome: Goal Ongoing     Problem: Thought Process - Altered  Goal: Demonstration of organized thought processes  Outcome: Goal Ongoing     Problem: Violence, self/other-directed, Risk of  Goal: Absence of violence  Outcome: Goal Ongoing  Goal: Knowledge of risk for violence, self/other-directed  Outcome: Goal Ongoing

## 2021-09-26 NOTE — Progress Notes
General Progress Note    Name:  Kristin Maynard   UEAVW'U Date:  09/26/2021  Admission Date: 09/23/2021  LOS: 3 days                     Assessment/Plan:    Principal Problem:    Schizophrenia (HCC)      Pituitary macroadenoma  Hyperprolactinemia  Amenorrhea  - CT head: Suprasellar mass measuring up to 1.4 cm consistent with reported macroadenoma  - Patient had no headaches. No vision problems or changes  - continue PTA bromocriptine  ?  Sinus tachycardia: Improving  Leukocytosis: Improved  - HR 100-130's  - ECG: sinus tachycardia, normal QTC  - UA and chest xray negative   -Suspect tachycardia due to anxiety and agitation  -Encourage oral fluids  Monitor  ?  Schizophrenia  - UDS ; negative  - psych consulted   - CO at bedside  -Social work on board to assist with inpatient psych placement    Fen  IV fluids;none  Renal diet  Monitor lites    Dispo: Transfer to [facility] for inpatient psych  ________________________________________________    _____________________________________________________________________    Subjective  Kristin Maynard is a 26 y.o. female.  No acute overnight events.  Patient reports doing fine this morning.  .  Medications  Scheduled Meds:bromocriptine (PARLODEL) tablet 25 mg, 25 mg, Oral, QHS  traZODone (DESYREL) tablet 50 mg, 50 mg, Oral, QHS    Continuous Infusions:  PRN and Respiratory Meds:acetaminophen Q6H PRN, calcium carbonate TID PRN, hydrOXYzine Q6H PRN, melatonin QHS PRN, OLANZapine Q6H PRN **OR** OLANZapine Q6H PRN, polyethylene glycol 3350 QDAY PRN, senna/docusate QDAY PRN      Review of Systems:  A 14 point review of systems was negative except for: Behvioral/Psych: positive for Hallucinations    Objective:                          Vital Signs: Last Filed                 Vital Signs: 24 Hour Range   BP: 143/101 (03/14 1421)  Temp: 36.5 ?C (97.7 ?F) (03/14 1421)  Pulse: 114 (03/14 1421)  Respirations: 17 PER MINUTE (03/14 1421)  SpO2: 98 % (03/14 1421)  O2 Device: None (Room air) (03/14 1421)  Height: 157.5 cm (5' 2) (03/14 1421) BP: (128-146)/(77-101)   Temp:  [36.5 ?C (97.7 ?F)-36.7 ?C (98 ?F)]   Pulse:  [103-122]   Respirations:  [16 PER MINUTE-18 PER MINUTE]   SpO2:  [96 %-98 %]   O2 Device: None (Room air)     Vitals:    09/23/21 1034 09/23/21 2120 09/26/21 1421   Weight: 88.5 kg (195 lb) 88.1 kg (194 lb 3.6 oz) 86.9 kg (191 lb 9.6 oz)       Intake/Output Summary:  (Last 24 hours)    Intake/Output Summary (Last 24 hours) at 09/26/2021 1508  Last data filed at 09/26/2021 0545  Gross per 24 hour   Intake 1952 ml   Output --   Net 1952 ml           Physical Exam    General: AO3x, no  distress   Head: NCAT  Eyes: PERRLA EOMI,   ENT: Normal external canals b/l  Neck: supple   Lungs: CTAB  Chest wall: No tenderness,   Heart: RRR, no murmurs.  Abdomen: soft ,non-tender, bs+  Extremities:no edema b/l  Peripheral pulses:  2+ and symmetric, all extremities  Skin: dry  Lymph nodes: No cervical LAD  Neurologic:grossly normal   Musculoskeletal: ntd  Lab Review  24-hour labs:    Results for orders placed or performed during the hospital encounter of 09/23/21 (from the past 24 hour(s))   CBC    Collection Time: 09/26/21  2:20 AM   Result Value Ref Range    White Blood Cells 12.4 (H) 4.5 - 11.0 K/UL    RBC 4.66 4.0 - 5.0 M/UL    Hemoglobin 13.6 12.0 - 15.0 GM/DL    Hematocrit 16.1 36 - 45 %    MCV 86.9 80 - 100 FL    MCH 29.1 26 - 34 PG    MCHC 33.5 32.0 - 36.0 G/DL    RDW 09.6 11 - 15 %    Platelet Count 414 (H) 150 - 400 K/UL    MPV 7.0 7 - 11 FL   COMPREHENSIVE METABOLIC PANEL    Collection Time: 09/26/21  2:20 AM   Result Value Ref Range    Sodium 138 137 - 147 MMOL/L    Potassium 4.1 3.5 - 5.1 MMOL/L    Chloride 101 98 - 110 MMOL/L    Glucose 95 70 - 100 MG/DL    Blood Urea Nitrogen 15 7 - 25 MG/DL    Creatinine 0.45 0.4 - 1.00 MG/DL    Calcium 9.4 8.5 - 40.9 MG/DL    Total Protein 7.2 6.0 - 8.0 G/DL    Total Bilirubin 0.4 0.3 - 1.2 MG/DL    Albumin 4.3 3.5 - 5.0 G/DL    Alk Phosphatase 85 25 - 110 U/L    AST (SGOT) 19 7 - 40 U/L    CO2 25 21 - 30 MMOL/L    ALT (SGPT) 33 7 - 56 U/L    Anion Gap 12 3 - 12    eGFR >60 >60 mL/min   MAGNESIUM    Collection Time: 09/26/21  2:20 AM   Result Value Ref Range    Magnesium 2.0 1.6 - 2.6 mg/dL       Point of Care Testing  (Last 24 hours)  Glucose: 95 (09/26/21 0220)    Radiology and other Diagnostics Review:    Pertinent radiology reviewed.   CT HEAD WO CONTRAST    Result Date: 09/23/2021  1.  No acute intracranial hemorrhage, hydrocephalus, or herniation. 2.  Suprasellar mass measuring up to 1.4 cm consistent with reported macroadenoma, though this is incompletely evaluated on this exam. MRI brain with and without contrast could be obtained for further evaluation if clinically indicated. By my electronic signature, I attest that I have personally reviewed the images for this examination and formulated the interpretations and opinions expressed in this report  Finalized by Dimple Casey, M.D. on 09/23/2021 7:26 PM. Dictated by Meredith Mody, MD on 09/23/2021 7:14 PM.       Otto Herb, MD   Pager [phone]

## 2021-09-26 NOTE — Progress Notes
Case Management Progress Note    NAME:Kristin Maynard MRN: [id number] DOB:[birth year] AGE: 26 y.o.  ADMISSION DATE: 09/23/2021 DAYS ADMITTED: LOS: 3 days     Date of Service: 09/26/2021    Pt has been accepted to [facility]    Waiting on correct orders to be placed and reconciled by primary doc, accepting provider is Dr Leonie Man to service SH-A for Dr Arnette Norris    Nurse to nurse completed    [facility] set for 1:30 pm pu

## 2021-09-26 NOTE — Group Note
Name: Kristin Maynard   MRN: [id number]     DOB: Jul 02, 1996      Age: 26 y.o.  Admission Date: 09/23/2021     LOS: 3 days     Date of Service: 09/26/2021      Group Topic: BH Coping Skills  Group Date: 09/26/2021  Start Time: 0915  End Time: 1000  Facilitators: Earl Lites          Number of Participants: 11  Group Focus: coping skills, leisure skills, self-awareness, and social skills  Treatment Modality: Recreation Therapy  Interventions utilized were group exercise and leisure development  Purpose: enhance coping skills, improve communication skills, increase insight, regain self-worth, and reinforce self-care    Patients exercised to music and played [employer].      Name: Kristin Maynard Date of Birth: 10-Aug-1995   MR: [id number]      Level of Participation: patient not present for group   Plan: to continue treatment

## 2021-09-27 ENCOUNTER — Encounter: Admit: 2021-09-27 | Discharge: 2021-09-27 | Payer: BC Managed Care – HMO

## 2021-09-27 DIAGNOSIS — D352 Benign neoplasm of pituitary gland: Secondary | ICD-10-CM

## 2021-09-27 DIAGNOSIS — E8881 Metabolic syndrome: Secondary | ICD-10-CM

## 2021-09-27 DIAGNOSIS — F209 Schizophrenia, unspecified: Secondary | ICD-10-CM

## 2021-09-27 DIAGNOSIS — N911 Secondary amenorrhea: Secondary | ICD-10-CM

## 2021-09-27 DIAGNOSIS — E221 Hyperprolactinemia: Secondary | ICD-10-CM

## 2021-09-27 MED ORDER — LIDOCAINE 5 % TP PTMD
1 | Freq: Every day | TOPICAL | 0 refills | Status: DC
Start: 2021-09-27 — End: 2021-10-12
  Administered 2021-09-27 – 2021-10-08 (×4): 1 via TOPICAL

## 2021-09-27 MED ORDER — LEVONORGESTREL-ETHINYL ESTRAD 0.1-20 MG-MCG PO TAB
1 | Freq: Every day | ORAL | 0 refills | Status: DC
Start: 2021-09-27 — End: 2021-11-02
  Administered 2021-09-27 – 2021-10-28 (×3): 1 via ORAL

## 2021-09-27 MED ORDER — METOPROLOL TARTRATE 25 MG PO TAB
25 mg | Freq: Every day | ORAL | 0 refills | Status: DC
Start: 2021-09-27 — End: 2021-10-23
  Administered 2021-09-27 – 2021-10-23 (×27): 25 mg via ORAL

## 2021-09-27 MED ORDER — LITHIUM CARBONATE 300 MG PO TBER
600 mg | Freq: Every evening | ORAL | 0 refills | Status: DC
Start: 2021-09-27 — End: 2021-09-27

## 2021-09-27 MED ORDER — BROMOCRIPTINE 2.5 MG PO TAB
20 mg | Freq: Every evening | ORAL | 0 refills | Status: DC
Start: 2021-09-27 — End: 2021-09-28
  Administered 2021-09-28: 02:00:00 20 mg via ORAL

## 2021-09-27 NOTE — H&P (View-Only)
PSYCHIATRY H&P  Name: Avika Bartl   MRN: [id number]     DOB: 04/03/1996      Age: 26 y.o.  Admission Date: 09/23/2021     LOS: 4 days     Date of Service: 09/27/2021      Service: [facility] Adult Psych A                                             PCP  No Pcp, Na    ASSESSMENT & DIAGNOSIS  Principal Problem:    Schizophrenia (HCC)  Active Problems:    Pituitary macroadenoma (HCC)    Hyperprolactinemia (HCC)    Elevated BP without diagnosis of hypertension    Sinus tachycardia    Leukocytosis      PLAN  1. Admit to [facility].  2. Continue admission to red zone   3. Internal Medicine Consult for Medical Evaluation and Co-Management of conditions as needed  4. Patient will undergo a medication evaluation and adjustment as necessary.  5. Participate in a program schedule including group and individual therapy.  6. The patient will be in a structured environment to maintain safety  7. Will obtain collateral information as needed.  1. Collateral obtained from patient's father Rozanna Bankston on 3/15  8. Prolactin level of 981 on 3/13 and per care everywhere has steadily been increasing 320 --> 479 --> 583 in spite of bromocriptine being titrated up to 25mg  nightly   9. Consulted endocrinology for recommendations and would recommend outpatient referral   10. Decrease bromocriptine to 20mg  qhs (PTA dose 25mg  qhs)  11. Abilify maintena 200mg  given 3 weeks ago during previous hospitalization  12. Could consider trial of clozaril due to unique mechanism of action and low risk for worsening prolactin elevations   13. Initially discussed augmentation of Abilify with lithium with patient's father but will hold off at this time    Discharge Plan  Patient will exhibit stability of their symptoms. Will work a Paramedic to develop a Water engineer. Estimated length of stay is 4-5 days.    ______________________________________________________________  CHIEF CONCERN  The patient was brought to the hospital for ?to save everyone.?    Sources of Information  Patient, Chart Review, Intake Assessment    HISTORY OF PRESENT ILLNESS  Dottie Santalucia is a 26 y.o. female with a history of schizophrenia and pituitary macroadenoma who presented to [facility] ED with worsened psychosis after recent discharge from [facility] and administration of Abilify Maintena 200mg  approximately 3 weeks ago.     Breazia was seen in the red zone after she had woken up after receiving zyprexa zydis. She was sitting at her table and reported she is here to save everyone. She reports feeling more clear and AH as good. She denies them being better since receiving zydis. She reports being more depressed recently. She does not think she has been sleeping well and estimates 1-2 hrs nightly. Appetite has been okay. She thinks she has been having nightmares every night since discharge from [facility]. She was oriented to month, year and thought it was the 16th. She becomes concerned that she needs to go to [facility] or she is going to become imensely worse. We discussed where she was and she continued to state that she needs to go to [facility]. At the end of our conversation the phone  began ringing behind the nurse's station in the red zone and she said answer that and stay on the phone at least 5 minutes.     Talayeh has continued to respond to stimuli. She required zydis and atarax as PRNs.     Collateral obtained from patient's father - Keiera Weisler  He was unsure of which medications she has tried in the past except for Abilify. He was going to get a list from the guidance center and bring it in when he visits. She has been on abilify for a couple of years and seems to get worse after LAI. She has had one previous LAI administered and two total from what he is aware of. He reports recent increase in puffiness in her face and eyes that he wonders if it might be from worsened tumor. He denies that she has been on prednisone. She has been on bromocriptine for several years and was started on 5mg  and increased to 25mg  over time. He thinks the last dose increase was 2-3 mo ago. They have not discussed surgical interventions. She has been gaining a lot of weight in 4 years. He reports negative experiences during her previous psychiatric hospitalizations. He worries about her wandering out of the house overnight which precipitated him bringing her to the hospital. The last he remembers her doing well was a few months ago.       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 Smoking status: Never   ? Smokeless tobacco: Never   Vaping Use   ? Vaping Use: Never used   Substance Use Topics   ? Alcohol use: Yes     Alcohol/week: 2.0 standard drinks     Types: 2 Cans of beer per week         FAMILY MEDICAL HISTORY  Family History   Problem Relation Age of Onset   ? None Reported Mother    ? None Reported Father    ? None Reported Sister    ? None Reported Brother        PAST MEDICAL AND SURGICAL HISTORY  Medical History:   Diagnosis Date   ? Amenorrhea, secondary    ? Hyperprolactinemia (HCC)    ? Metabolic syndrome    ? Pituitary macroadenoma (HCC)    ? Schizophrenia (HCC)      History reviewed. No pertinent surgical history.    Home Medications  Medications Prior to Admission   Medication Sig Dispense Refill Last Dose   ? ABILIFY MAINTENA 400 mg injectable VIAL Inject four hundred mg into the muscle every 28 days.   Past Month   ? ARIPiprazole (ABILIFY) 30 mg tablet Take one tablet by mouth daily.   09/21/2021   ?  bromocriptine (PARLODEL) 5 mg capsule Take five capsules by mouth.   09/21/2021   ? fluPHENAZine (PROLIXIN) 1 mg tablet Take one tablet by mouth daily.   09/21/2021   ? metoprolol tartrate 25 mg tablet Take one tablet by mouth daily.   Not Started   ? VOLNEA (28) 0.15-0.02 mgx21 /0.01 mg x 5 tablet Take one tablet by mouth daily.   09/21/2021       Hospital Medications  Scheduled Meds:bromocriptine (PARLODEL) tablet 20 mg, 20 mg, Oral, QHS  levonorgestrel-ethinyl estradiol (AVIANE-28, LESSINA-28) tablet 1 tablet, 1 tablet, Oral, QDAY  lidocaine (LIDODERM) 5 % topical patch 1 patch, 1 patch, Topical, QDAY  metoprolol tartrate tablet 25 mg, 25 mg, Oral, QDAY  traZODone (DESYREL) tablet 50 mg, 50 mg, Oral, QHS    Continuous Infusions:  PRN and Respiratory Meds:acetaminophen Q6H PRN, calcium carbonate TID PRN, hydrOXYzine Q6H PRN, melatonin QHS PRN, OLANZapine Q6H PRN **OR** OLANZapine Q6H PRN, polyethylene glycol 3350 QDAY PRN, senna/docusate QDAY PRN      Allergies  Patient has no known allergies.    REVIEW OF SYSTEMS  Review of Systems   Unable to perform ROS: Psychiatric disorder       ______________________________________________________________  OBJECTIVE     Vital Signs: Last Filed In 24 Hours                Vital Signs: 24 Hour Range   BP: 131/103 (03/15 0700)  Temp: 36.2 ?C (97.2 ?F) (03/15 0700)  Pulse: 99 (03/15 0700)  Respirations: 16 PER MINUTE (03/15 0700)  SpO2: 98 % (03/15 0700) BP: (131-142)/(76-103)   Temp:  [36.2 ?C (97.2 ?F)-36.8 ?C (98.2 ?F)]   Pulse:  [99-100]   Respirations:  [16 PER MINUTE]   SpO2:  [97 %-98 %]    Intensity Pain Scale (Self Report): (not recorded)    Height: 157.5 cm (5' 2)  Weight: 86.9 kg (191 lb 9.6 oz)      MENTAL STATUS EXAMINATION    ? General/Constitutional: 26 y/o female, appears stated age, no acute distress, overweight, dressed appropriately, in good grooming/hygiene  ? Behavior: disorganized, mostly calm   ? Eye Contact: intermittent   ? Speech: Normal rate, rhythm, tone and increased volume at times   ? Thought Process: disorganized   ? Thought Content: Denied Suicidal Thoughts. Denied Homicidal Thoughts. Delusions noted   ? Perception: Endorses AH, denies VH  ? Associations: mostly intact  ? Mood: fine  ? Affect: flat  ? Insight/Judgement: poor/poor    ? Orientation: Alert and Oriented to person, place, time and situation  ? Recent and remote memory: poor  ? Attention span and concentration: decreased  ? Language: Fluent, Spontaneous  ? Fund of knowledge and vocabulary: Appears Average  ? Motor: No tics or tremors noted. Gait is WNL.      Lab/Radiology/Other Diagnostic Tests:  Admission on 09/23/2021   Component Date Value   ? COVID-19 (SARS-CoV-2) PC* 09/23/2021                      Value:FLOCKED SWAB  NASOPHARYNGEAL     ? COVID-19 (SARS-CoV-2) PCR 09/23/2021 NOT DETECTED    ? White Blood Cells 09/23/2021 13.9 (H)    ? RBC 09/23/2021 4.80    ? Hemoglobin 09/23/2021 13.7    ? Hematocrit 09/23/2021 41.5    ? MCV 09/23/2021 86.4    ? [facility] 09/23/2021 28.6    ? MCHC 09/23/2021 33.1    ? RDW 09/23/2021 13.7    ?  Platelet Count 09/23/2021 417 (H)    ? MPV 09/23/2021 6.8 (L)    ? Neutrophils 09/23/2021 77    ? Lymphocytes 09/23/2021 17 (L)    ? Monocytes 09/23/2021 5    ? Eosinophils 09/23/2021 0    ? Basophils 09/23/2021 1    ? Absolute Neutrophil Count 09/23/2021 10.78 (H)    ? Absolute Lymph Count 09/23/2021 2.31    ? Absolute Monocyte Count 09/23/2021 0.70    ? Absolute Eosinophil Count 09/23/2021 0.02    ? Absolute Basophil Count 09/23/2021 0.09    ? MDW (Monocyte Distributi* 09/23/2021 17.0    ? Sodium 09/23/2021 139    ? Potassium 09/23/2021 3.7    ? Chloride 09/23/2021 103    ? Glucose 09/23/2021 110 (H)    ? Blood Urea Nitrogen 09/23/2021 10    ? Creatinine 09/23/2021 0.64    ? Calcium 09/23/2021 9.5    ? Total Protein 09/23/2021 7.3    ? Total Bilirubin 09/23/2021 0.4    ? Albumin 09/23/2021 4.5    ? Alk Phosphatase 09/23/2021 88    ? AST (SGOT) 09/23/2021 22    ? CO2 09/23/2021 22    ? ALT (SGPT) 09/23/2021 41    ? Anion Gap 09/23/2021 14 (H)    ? eGFR 09/23/2021 >60    ? TSH 09/23/2021 1.06    ? Color,UA 09/23/2021 STRAW    ? Turbidity,UA 09/23/2021 CLEAR    ? Specific Gravity-Urine 09/23/2021 1.006    ? pH,UA 09/23/2021 6.0    ? Protein,UA 09/23/2021 NEG    ? Glucose,UA 09/23/2021 NEG    ? Marguerite Olea 09/23/2021 NEG    ? Bilirubin,UA 09/23/2021 NEG    ? Blood,UA 09/23/2021 NEG    ? Urobilinogen,UA 09/23/2021 NORMAL    ? Nitrite,UA 09/23/2021 NEG    ? Leukocytes,UA 09/23/2021 NEG    ? Urine Ascorbic Acid, UA 09/23/2021 NEG    ? WBCs,UA 09/23/2021 0-2    ? RBCs,UA 09/23/2021 0-2    ? Comment,UA 09/23/2021                      Value:Criteria for reflex to culture are WBC>10, Positive Nitrite, and/or >=+1   leukocytes. If quantity is not sufficient, an addendum will follow.     ? MucousUA 09/23/2021 TRACE    ? Squamous Epithelial Cells 09/23/2021 0-2    ? VENTRICULAR RATE 09/23/2021 91    ? P-R INTERVAL 09/23/2021 170    ? QRS DURATION 09/23/2021 72    ? Q-T INTERVAL 09/23/2021 342    ? QTC CALCULATION (BAZETT) 09/23/2021 420    ? P AXIS 09/23/2021 19    ? R AXIS 09/23/2021 35    ? T AXIS 09/23/2021 16    ? Amphetamines 09/23/2021 NEG    ? Barbiturates,Urine 09/23/2021 NEG    ? Benzodiazepines 09/23/2021 NEG    ? THC 09/23/2021 NEG    ? Cocaine-Urine 09/23/2021 NEG    ? Methadone 09/23/2021 NEG    ? Opiates 300 09/23/2021 NEG    ? Oxycodone, Urine 09/23/2021 NEG    ? Phencyclidine (PCP) 09/23/2021 NEG    ? White Blood Cells 09/24/2021 12.2 (H)    ? RBC 09/24/2021 4.50    ? Hemoglobin 09/24/2021 12.9    ? Hematocrit 09/24/2021 39.4    ? MCV 09/24/2021 87.7    ? [facility] 09/24/2021 28.8    ? MCHC 09/24/2021 32.8    ? RDW 09/24/2021 13.5    ?  Platelet Count 09/24/2021 391    ? MPV 09/24/2021 6.9 (L)    ? Sodium 09/24/2021 141    ? Potassium 09/24/2021 3.9    ? Chloride 09/24/2021 105    ? Glucose 09/24/2021 99    ? Blood Urea Nitrogen 09/24/2021 10    ? Creatinine 09/24/2021 0.67    ? Calcium 09/24/2021 9.2    ? Total Protein 09/24/2021 6.6    ? Total Bilirubin 09/24/2021 0.4    ? Albumin 09/24/2021 4.1    ? Alk Phosphatase 09/24/2021 78    ? AST (SGOT) 09/24/2021 17    ? CO2 09/24/2021 25    ? ALT (SGPT) 09/24/2021 35    ? Anion Gap 09/24/2021 11    ? eGFR 09/24/2021 >60    ? Magnesium 09/24/2021 2.0    ? Cortisol, Random 09/24/2021 13.1    ? White Blood Cells 09/25/2021 11.0    ? RBC 09/25/2021 4.56    ? Hemoglobin 09/25/2021 13.1    ? Hematocrit 09/25/2021 39.6    ? MCV 09/25/2021 86.9    ? [facility] 09/25/2021 28.8    ? MCHC 09/25/2021 33.1    ? RDW 09/25/2021 13.8    ? Platelet Count 09/25/2021 385    ? MPV 09/25/2021 6.9 (L)    ? Sodium 09/25/2021 139    ? Potassium 09/25/2021 3.8    ? Chloride 09/25/2021 103    ? Glucose 09/25/2021 106 (H)    ? Blood Urea Nitrogen 09/25/2021 16    ? Creatinine 09/25/2021 0.64    ? Calcium 09/25/2021 9.5    ? Total Protein 09/25/2021 6.6    ? Total Bilirubin 09/25/2021 0.4    ? Albumin 09/25/2021 4.2    ? Alk Phosphatase 09/25/2021 83    ? AST (SGOT) 09/25/2021 15    ? CO2 09/25/2021 24    ? ALT (SGPT) 09/25/2021 32    ? Anion Gap 09/25/2021 12    ? eGFR 09/25/2021 >60    ? Magnesium 09/25/2021 2.0    ? VENTRICULAR RATE 09/23/2021 109    ? P-R INTERVAL 09/23/2021 160    ? QRS DURATION 09/23/2021 74    ? Q-T INTERVAL 09/23/2021 330    ? QTC CALCULATION (BAZETT) 09/23/2021 444    ? P AXIS 09/23/2021 52    ? R AXIS 09/23/2021 12    ? T AXIS 09/23/2021 38    ? Prolactin 09/25/2021 981.9 (H)    ? White Blood Cells 09/26/2021 12.4 (H)    ? RBC 09/26/2021 4.66    ? Hemoglobin 09/26/2021 13.6    ? Hematocrit 09/26/2021 40.5    ? MCV 09/26/2021 86.9    ? [facility] 09/26/2021 29.1    ? MCHC 09/26/2021 33.5    ? RDW 09/26/2021 13.9    ? Platelet Count 09/26/2021 414 (H)    ? MPV 09/26/2021 7.0    ? Sodium 09/26/2021 138    ? Potassium 09/26/2021 4.1    ? Chloride 09/26/2021 101    ? Glucose 09/26/2021 95    ? Blood Urea Nitrogen 09/26/2021 15    ? Creatinine 09/26/2021 0.74    ? Calcium 09/26/2021 9.4    ? Total Protein 09/26/2021 7.2    ? Total Bilirubin 09/26/2021 0.4    ? Albumin 09/26/2021 4.3    ? Alk Phosphatase 09/26/2021 85    ? AST (SGOT) 09/26/2021 19    ? CO2 09/26/2021 25    ? ALT (SGPT)  09/26/2021 33    ? Anion Gap 09/26/2021 12    ? eGFR 09/26/2021 >60    ? Magnesium 09/26/2021 2.0      ______________________________________________________________  [name], DO

## 2021-09-27 NOTE — Progress Notes
Pt up to nurse's station at 6-10 minute intervals asking if it is 7.Pt states, " I believe it is 7am!" Pt with increased anxiety/agitation, but redirectable at this time.  Will continue to monitor.

## 2021-09-27 NOTE — Progress Notes
PRN Medication Administered:  Atarax '25mg'$   Non pharmacological interventions attempted prior to PRN medication administration.    Lowering environmental stimuli    Brief narrative of reason for PRN medication administration: Pt with increasing anxiety/agitation.

## 2021-09-27 NOTE — Group Note
Name: Kristin Maynard   MRN: [id number]     DOB: 1996/05/11      Age: 26 y.o.  Admission Date: 09/23/2021     LOS: 4 days     Date of Service: 09/27/2021      Group Topic: BH Positive Problem Solving  Group Date: 09/27/2021  Start Time: 1310  End Time: [place]  Facilitators: Candie Chroman          Number of Participants: 4  Group Focus: coping skills  Treatment Modality: Art Therapy  Interventions utilized were assignment and exploration  Purpose: enhance coping skills and reinforce self-care    Group members were provided with a variety of papers, glue sticks, and oil pastels and encouraged to create an abstract collage about something they enjoy.        Name: Kristin Maynard Date of Birth: [birth year]   MR: [id number]      Level of Participation: patient not present for group   Plan: to continue treatment

## 2021-09-27 NOTE — Behavioral Health Treatment Team
TREATMENT TEAM NOTE  Name: Kristin Maynard          MRN: [id number]              DOB: June 30, 1996          Age: 26 y.o.  Admission Date: 09/23/2021                 Attendees:   Psychiatrist: Arnette Norris, DO  Nurse Supervisor: Cyndia Skeeters, RN, BSN  Nurse: Lucius Conn, RN  Pharmacist: Doreen Beam, PharmD  Therapist: Dewayne Hatch, [place], [place]  Therapist: Neita Carp, [place]  Case Manager: Carmela Rima, LMSW  Case Manager: Tinnie Gens, LMSW  Case Manager: April Cordero, [place]  Utilization Review: Areatha Keas, RN      Significant Points Discussed: Came in last night. Psychoses. On red zone. Denies SI, HI. Thoughts that pt 'needs to save everybody'. PRN Zyprexa yesterday afternoon.      Treatment Plan Discussion: Pt had services at [facility]. Pt has DPOA, not guardian; admitting notified. Change antipsychotic.      Projected Discharge Date: TBD

## 2021-09-27 NOTE — Group Note
Name: Norlene Lanes   MRN: [id number]     DOB: 1995/10/30      Age: 26 y.o.  Admission Date: 09/23/2021     LOS: 4 days     Date of Service: 09/27/2021      Group Topic: [place] Leisure Activities  Group Date: 09/27/2021  Start Time: 0915  End Time: 1000  Facilitators: Earl Lites          Number of Participants:9  Group Focus: leisure skills and social skills  Treatment Modality: Leisure Development  Interventions utilized were group exercise and leisure development  Purpose: improve communication skills, regain self-worth, and reinforce self-care    Patients exercised to music and played team Scattergories. Some catergories were added to the game list, such as anger management, leisure activity, quality of a friend, building self-esteem.      Name: Leland Date of Birth: [birth year]   MR: [id number]      Level of Participation: patient not present for group   Plan: to continue treatment

## 2021-09-27 NOTE — Group Note
Name: Kristin Maynard   MRN: [id number]     DOB: [birth year]      Age: 26 y.o.  Admission Date: 09/23/2021     LOS: 4 days     Date of Service: 09/27/2021      Group Topic: [place] Leisure Activities  Group Date: 09/27/2021  Start Time: 7253  End Time: 1300  Facilitators: Earl Lites          Number of Participants: 3  Group Focus: coping skills, leisure skills, and social skills  Treatment Modality: Leisure Psychologist, counselling and Recreation Therapy  Interventions utilized were leisure development  Purpose: enhance coping skills, improve communication skills, and reinforce self-care    Patients enjoyed petting a dog and sharing stories about their pets during Pet Therapy.      Name: Kristin Maynard Date of Birth: [birth year]   MR: [id number]      Level of Participation: patient not present for group   Plan: to continue treatment

## 2021-09-27 NOTE — Progress Notes
PRN Medication Administered:  Zyprexa Zydis 5 mg SL  Non pharmacological interventions attempted prior to PRN medication administration:  Verbal de-escalation techniques, Decrease stimuli and Increase structure and limit setting, talked 1:1 with her    Brief narrative of reason for PRN medication administration:   Kristin Maynard c/o of auditory hallucinations to marry her and visual hallucinations of demons.

## 2021-09-27 NOTE — Progress Notes
PRN Medication Administered:  Zyprexa zydis '5mg'$  ODT. Effective.    Non pharmacological interventions attempted prior to PRN medication administration:  Increase structure and limit setting    Brief narrative of reason for PRN medication administration:   Pt observed responding to unseen others and appeared distressed AEB furrowed brows, pacing, repeatedly asking, "When are we going?" to staff. "I need to go to [facility] get to. I'll be going in an ambulance with seven others. The one who's made the most mistakes during this journey, 'The Ultimate Evil' will be driving. He shouldn't fail. I would like everyone to tour the facility so they know it's safe."

## 2021-09-27 NOTE — Care Plan
Problem: Skin Integrity  Goal: Skin integrity intact  Outcome: Goal Ongoing  Goal: Healing of skin (Wound & Incision)  Outcome: Goal Ongoing  Goal: Healing of skin (Pressure Injury)  Outcome: Goal Ongoing     Problem: Harm to self, high risk of suicide  Goal: Absence of Harm to Self  Outcome: Goal Ongoing     Problem: Elopement Prevention  Goal: Patients without Decisional Capacity will not elope from the care setting  Outcome: Goal Ongoing     Problem: High Fall Risk  Goal: High Fall Risk  Outcome: Goal Ongoing     Problem: Mood - Altered  Goal: Stabilize mood  Outcome: Goal Ongoing  Goal: Knowledge of Altered Mood  Outcome: Goal Ongoing     Problem: Self-esteem - Low  Goal: Demonstration of positive self-esteem  Outcome: Goal Ongoing     Problem: Thought Process - Altered  Goal: Demonstration of organized thought processes  Outcome: Goal Ongoing     Problem: Violence, self/other-directed, Risk of  Goal: Absence of violence  Outcome: Goal Ongoing  Goal: Knowledge of risk for violence, self/other-directed  Outcome: Goal Ongoing

## 2021-09-27 NOTE — Care Plan
Problem: Mood - Altered  Goal: Stabilize mood  Flowsheets (Taken 09/26/2021 2232)  Stabilize mood:   Assess coping style   Establish therapeutic relationships  Goal: Knowledge of Altered Mood  Flowsheets (Taken 09/26/2021 2232)  Knowledge of altered mood:   Provide psychosocial intervention education   Provide relaxation techniques education     Problem: Thought Process - Altered  Goal: Demonstration of organized thought processes  Flowsheets (Taken 09/26/2021 2232)  Demonstration of organized thought processes:   Assess thought process   Assess in reality orientation

## 2021-09-27 NOTE — Group Note
Name: Kristin Maynard   MRN: [id number]     DOB: [birth year]      Age: 26 y.o.  Admission Date: 09/23/2021     LOS: 4 days     Date of Service: 09/27/2021      Group Topic: BH Coping Skills  Group Date: 09/27/2021  Start Time: 1015  End Time: 1100  Facilitators: Harrell Lark          Number of Participants: 8  Group Focus: communication, concentration, coping skills, feeling awareness/expression, leisure skills, music therapy, problem solving, relaxation, and social skills  Treatment Modality: Music Therapy  Interventions utilized were other Lyric Poems  Purpose: enhance coping skills, express feelings, improve communication skills, increase insight, and reinforce self-care    Lyric Poems -The MT provided the patients with construction paper, portions of preselected song lyrics cut into strips or phrases, and a marker. The MT then instructed the clients to arrange their given strips into any order they feel expresses themselves.  After arranging the strips into their desired layout, the MT provided the clients with tape to secure them. While completing their poems, the therapist played patient preferred music.  Patients then shared with the group their lyric scramblers. The purpose of this intervention is to enhance coping skills, self-care, express feelings, attention skills, and leisure skills    Name: Kristin Maynard Date of Birth: 10-26-1995   MR: [id number]      Level of Participation: patient not present for group   Plan: to continue treatment

## 2021-09-28 ENCOUNTER — Encounter: Admit: 2021-09-28 | Discharge: 2021-09-28 | Payer: BC Managed Care – HMO

## 2021-09-28 MED ORDER — NITROFURANTOIN MONOHYD/M-CRYST 100 MG PO CAP
100 mg | Freq: Two times a day (BID) | ORAL | 0 refills | Status: DC
Start: 2021-09-28 — End: 2021-09-29
  Administered 2021-09-28 – 2021-09-29 (×3): 100 mg via ORAL

## 2021-09-28 MED ORDER — CABERGOLINE 0.5 MG PO TAB
.5 mg | ORAL_TABLET | ORAL | 0 refills | Status: DC
Start: 2021-09-28 — End: 2021-10-25
  Filled 2021-09-29: qty 10, 35d supply, fill #1

## 2021-09-28 MED ORDER — BROMOCRIPTINE 2.5 MG PO TAB
15 mg | Freq: Every evening | ORAL | 0 refills | Status: DC
Start: 2021-09-28 — End: 2021-09-28

## 2021-09-28 NOTE — Progress Notes
PRN Medication Administered:  Zyprexa Zydis '5mg'$     Non pharmacological interventions attempted prior to PRN medication administration:  Decrease stimuli, redirection    Brief narrative of reason for PRN medication administration:   Pt approached nurses station stating "I need a heart transplant", then stated that she needs to go to [place]". Pt denies current chest pain. She appears to be responding to internal stimuli.

## 2021-09-28 NOTE — Care Plan
Problem: Mood - Altered  Goal: Stabilize mood  Outcome: Goal Ongoing  Flowsheets (Taken 09/27/2021 2137)  Stabilize mood:   Assess coping style   Assess depression history   Assess depressive symptoms   Assess ineffective coping signs and symptoms   Establish therapeutic relationships     Problem: Self-esteem - Low  Goal: Demonstration of positive self-esteem  Outcome: Goal Ongoing  Flowsheets (Taken 09/27/2021 2137)  Demonstration of positive self-esteem:   Provide problem-solving support   Assess low self-esteem signs and symptoms   Provide positive reinforcement     Problem: Violence, self/other-directed, Risk of  Goal: Absence of violence  Outcome: Goal Ongoing  Flowsheets (Taken 09/27/2021 2137)  Absence of violence:   Suicide assessment   Supervise medication intake   Suicide precautions

## 2021-09-28 NOTE — Behavioral Health Treatment Team
TREATMENT TEAM NOTE  Name: Kristin Maynard          MRN: [id number]              DOB: 1996/07/15          Age: 26 y.o.  Admission Date: 09/23/2021                 Attendees:   Psychiatrist: Arnette Norris, DO  Nurse Supervisor: Cyndia Skeeters, RN, BSN  Nurse: Lucius Conn, RN  Pharmacist: Doreen Beam, PharmD  Therapist: Dewayne Hatch, [place], [place]  Therapist: Neita Carp, LMSW  Case Manager: Carmela Rima, LMSW  Case Manager: April Cordero, [place]  Utilization Review: Areatha Keas, RN        Significant Points Discussed: anxiety=9, depression=2; SI with no plan; HI towards the people trying to help her; AH of people saying "marry me"; VH of demons; perseverating on heart surgery to fix brain, not heart; slept 4 hours       Treatment Plan Discussion: ordered endo consult; will work on med changes, as current regimen is contributing to worsening hallucinations       Projected Discharge Date: TBD

## 2021-09-28 NOTE — Progress Notes
Psychiatry Progress Note  Name: Kristin Maynard          MRN: [id number] DOB: [birth year]         Age: 26 y.o.  Admission Date: 09/23/2021  LOS: 5 days    Service: [facility] Adult Psych A    Principal Problem:    Schizophrenia (HCC)  Active Problems:    Pituitary macroadenoma (HCC)    Hyperprolactinemia (HCC)    Elevated BP without diagnosis of hypertension    Sinus tachycardia    Leukocytosis    PLAN  1. Continue admission to red zone for disorganization and behaviors that are minimally redirectable and not appropriate for the general milieu at this time   2. Decrease bromocriptine to 15mg  qhs (PTA dose 25mg  qhs)  3. Attempted to contact Dr. Threasa Beards with [facility], outpatient endocrinologist, to coordinate care and left a message with my contact information   4. Consult pending for [place] [facility]   5. Will continue other medications as currently prescribed      REASONS FOR CONTINUED HOSPITALIZATION  Psychiatric stabilization and medication management. Patient with complex medical history and medication side effects that are likely contributing to treatment resistant schizophrenia. She is requiring further medication adjustments for symptoms of psychosis.      ______________________________________________________________  Kristin Maynard was seen for follow-up in the red zone today.  She stated that the voices are better today and rates them as a 1 out of 10 with 10 being the worst.  She is unable to make out what they are saying at this time.  She continues to perseverate on needing to go to [facility].  When asked what type of place it was she stated that I would like here but I 100 times better because they have their a movie theater there.  She denies SI/HI. When asked about HI she reports I do want to scold them. She denies VH at this time and continues to appear as though she is responding to hallucinations.     She has been reading her anxiety and depression as high 9-10.  She also endorsed suicidal ideations without plan to other staff members.  She reports visual hallucinations of demons and auditory hallucinations of voices stating marry me. She required zyprexa 5mg  x2 yesterday and also required a dose this morning around 4am. She took PRN Trazodone last night. She slept a documented 4 hrs last night.     REVIEW OF SYSTEMS  Review of Systems   Constitutional: Negative for appetite change and fatigue.   HENT: Negative for congestion.    Gastrointestinal: Negative for nausea and vomiting.   Musculoskeletal: Positive for back pain.        Shoulder pain    Neurological: Negative for dizziness.   Psychiatric/Behavioral: Positive for agitation and hallucinations. Negative for sleep disturbance.     ______________________________________________________________  OBJECTIVE                 Vital Signs:  Current                Vital Signs: 24 Hour Range   BP: 134/92 (03/16 0700)  Temp: 36.9 ?C (98.5 ?F) (03/16 0700)  Pulse: 100 (03/16 0700)  Respirations: 16 PER MINUTE (03/16 0700)  SpO2: 95 % (03/16 0700) BP: (134-137)/(83-92)   Temp:  [36.8 ?C (98.3 ?F)-36.9 ?C (98.5 ?F)]   Pulse:  [100-125]   Respirations:  [16 PER MINUTE]   SpO2:  [95 %-97 %]  Intensity Pain Scale (Self Report): (not recorded)      Scheduled Medications:  bromocriptine (PARLODEL) tablet 15 mg, 15 mg, Oral, QHS  levonorgestrel-ethinyl estradiol (AVIANE-28, LESSINA-28) tablet 1 tablet, 1 tablet, Oral, QDAY  lidocaine (LIDODERM) 5 % topical patch 1 patch, 1 patch, Topical, QDAY  metoprolol tartrate tablet 25 mg, 25 mg, Oral, QDAY  traZODone (DESYREL) tablet 50 mg, 50 mg, Oral, QHS        PRN Medications:  acetaminophen Q6H PRN, calcium carbonate TID PRN, hydrOXYzine Q6H PRN 25 mg at 09/28/21 0241, melatonin QHS PRN 5 mg at 09/27/21 2032, OLANZapine Q6H PRN 5 mg at 09/28/21 0402 **OR** OLANZapine Q6H PRN, polyethylene glycol 3350 QDAY PRN, senna/docusate QDAY PRN    MENTAL STATUS EXAMINATION  General/Constitutional: appears stated age, dressed appropriately, good hygiene with wet hair following shower   Eye Contact: poor  Behavior: calm and cooperative, frustrated at times when discussing [employer]: delayed response, regular rhythm, increased volume at times   Mood: okay  Affect: flat  Thought Process: disorganized with thought blocking   Thought Content: denies SI, HI. Evidence of delusions and paranoia   Perception: Endorses AH. Denies VH. Appears to be responding to stimuli  Associations: some loosening   Insight/Judgment: impaired/impaired    Physical Exam:  Gait: Ambulates without difficulty. Normal arm swing   ______________________________________________________________  [name], DO

## 2021-09-28 NOTE — Progress Notes
PRN Medication Administered:  Atarax 25 mg PO    Non pharmacological interventions attempted prior to PRN medication administration:  Decreased stimuli, re-direction, emotional support.    Brief narrative of reason for PRN medication administration:  Pt exited room and began to pace hallway. Upon assessment, pt told this writer that she could not sleep while continuing to pace. PRN Atarax was given.

## 2021-09-28 NOTE — Progress Notes
Case Management Progress Note    NAME:Kristin Maynard MRN: [id number] DOB:14-Nov-1995 AGE: 26 y.o.  ADMISSION DATE: 09/23/2021 DAYS ADMITTED: LOS: 5 days     Date of Service: 09/28/2021  Service Start Time:  4:08pm  Service End Time:  4:10pm    CM briefly spoke with patient in the Red Zone. CM gave patient her glasses and informed her they were dropped off by her brother, Mitzi Hansen. Patient appeared to brighten after hearing this. Patient states she is feeling "okay" but better than yesterday. Patient remains agreeable to engaging in outpatient services at the [facility]. Patient asked if her medication list here is the same at [facility]. Per patient, the medications worked "well enough for me to get out", but patient states she may need more adjustments. CM to inform treatment team and will follow-up with patient as needed.

## 2021-09-28 NOTE — Group Note
Name: Ann Bohne   MRN: [id number]     DOB: October 19, 1995      Age: 26 y.o.  Admission Date: 09/23/2021     LOS: 5 days     Date of Service: 09/28/2021      Group Topic: [place] Leisure Activities  Group Date: 09/28/2021  Start Time: 0915  End Time: 1015  Facilitators: Harrell Lark          Number of Participants: 4  Group Focus: communication, concentration, coping skills, goals/reality orientation, leisure skills, relaxation, reminiscence, and social skills  Treatment Modality: Music Therapy  Interventions utilized were other Mixtape  Purpose: enhance coping skills, improve communication skills, reinforce self-care, and increase memory recall    Mixtape: The patients played the game "Mixtape" in which patients drew a scenario card and chose a song fitting that scenario. The purpose of this intervention was to increase coping skills, leisure skills, communication, and feeling awareness/expression.      Name: Sadhana Date of Birth: 1996/05/27   MR: [id number]      Level of Participation: patient not present for group   Plan: to continue treatment

## 2021-09-29 ENCOUNTER — Encounter: Admit: 2021-09-29 | Discharge: 2021-09-29 | Payer: BC Managed Care – HMO

## 2021-09-29 NOTE — Progress Notes
PRN Medication Administered:  zytis 5  Non pharmacological interventions attempted prior to PRN medication administration:  Decrease stimuli    Brief narrative of reason for PRN medication administration: internal agitation, voices/hallucination

## 2021-09-29 NOTE — Group Note
Name: Kristin Maynard   MRN: [id number]     DOB: March 31, 1996      Age: 26 y.o.  Admission Date: 09/23/2021     LOS: 6 days     Date of Service: 09/29/2021      Group Topic: BH Coping Skills  Group Date: 09/29/2021  Start Time: 0900  End Time: 1000  Facilitators: Candie Chroman          Number of Participants: 5  Group Focus: feeling awareness/expression  Treatment Modality: Art Therapy  Interventions utilized were assignment and exploration  Purpose: enhance coping skills, express feelings, and reinforce self-care    Patients were encouraged to identify and explore feelings through use of clay        Name: Kristin Maynard Date of Birth: 07-11-1996   MR: [id number]      Level of Participation: patient not present for group   Plan: to continue treatment

## 2021-09-29 NOTE — Group Note
Name: Kristin Maynard   MRN: [id number]     DOB: 1996-04-05      Age: 26 y.o.  Admission Date: 09/23/2021     LOS: 6 days     Date of Service: 09/29/2021      Group Topic: BH Coping Skills  Group Date: 09/29/2021  Start Time: 1400  End Time: 1500  Facilitators: Candie Chroman          Number of Participants: 5  Group Focus: feeling awareness/expression  Treatment Modality: Art Therapy  Interventions utilized were assignment and exploration  Purpose: enhance coping skills, express feelings, and reinforce self-care    Group members experimented with abstract expressionism to promote feeling identification and affect regulation.           Name: Kristin Maynard Date of Birth: April 23, 1996   MR: [id number]      Level of Participation: patient not present for group   Plan: to continue treatment

## 2021-09-29 NOTE — Group Note
Name: Ardene Remley   MRN: [id number]     DOB: 1995/10/10      Age: 26 y.o.  Admission Date: 09/23/2021     LOS: 6 days     Date of Service: 09/29/2021      Group Topic: BH Self-Care  Group Date: 09/29/2021  Start Time: 1245  End Time: 1345  Facilitators: Harrell Lark          Number of Participants: 6  Group Focus: communication, concentration, coping skills, feeling awareness/expression, leisure skills, music therapy, problem solving, relaxation, reminiscence, and social skills  Treatment Modality: Music Therapy  Interventions utilized were other Emotions and Music  Purpose: enhance coping skills, express feelings, increase insight, and reinforce self-care      Emotions and Music: The therapist facilitated a discussion about how music can be used as a coping skill when experiencing and connecting with emotions. The group discussed how music can influence emotions as well. The therapist then provided the patients with  circle that had the seven main emotions and a blank outer circle; patients were then instructed to write down songs that reflected these emotions. The group then listened to songs they had written for 'happy.'     Name: Jahmiya Date of Birth: 04/21/1996   MR: [id number]      Level of Participation: patient not present for group   Plan: to continue treatment

## 2021-09-29 NOTE — Behavioral Health Treatment Team
TREATMENT TEAM NOTE  Name: Kristin Maynard          MRN: [id number]              DOB: 1996-01-17          Age: 26 y.o.  Admission Date: 09/23/2021                 Attendees:   Psychiatrist: Arnette Norris, DO  Nurse Supervisor: Cyndia Skeeters, RN, BSN  Nurse: Lucius Conn, RN  Pharmacist: Doreen Beam, PharmD  Therapist: Dewayne Hatch, [place], [place]  Therapist: Neita Carp, [place]  Case Manager: Carmela Rima, LMSW  Case Manager: April Cordero, [place]  Utilization Review: Areatha Keas, RN    Significant Points Discussed: Anxiety 2/10. Depression 2/10. AH of whispers. Restless overnight. Delusion of needing to go to [facility]'. PRN Zyprexa; twice overnight. Slept 3 hours.    Treatment Plan Discussion: CM and therapy check-in with pt ftr. Pt was born in mental health facility in [place]. Pt bio mtr was reportedly 'spiritually disturbed'. Pt had been doing well prior to ~1 month ago. Btr brought by discharge ppw from [facility], medication list, and glasses. Switch meds.    Projected Discharge Date: TBD

## 2021-09-29 NOTE — Progress Notes
PRN Medication Administered:  Atarax 25 mg po prn.  Non pharmacological interventions attempted prior to PRN medication administration:  Showered, laying down to decrease stimuli.    Brief narrative of reason for PRN medication administration: 5/10 anxiety, also endorsed hearing things no on else does.    Denies SI/HI/VH.

## 2021-09-29 NOTE — Progress Notes
Metabolics Readings:    Most Recent BP:    BP Readings from Last 1 Encounters:   09/29/21 (!) 147/98     Last Weight:   Vitals:    09/23/21 1034 09/23/21 2120 09/26/21 1421   Weight: 88.5 kg (195 lb) 88.1 kg (194 lb 3.6 oz) 86.9 kg (191 lb 9.6 oz)     Body mass index is 35.04 kg/m.    Lipid Panel:   LDL   Date Value Ref Range Status   09/29/2021 131 (H) <100 mg/dL Final     HDL   Date Value Ref Range Status   09/29/2021 55 >40 MG/DL Final     VLDL   Date Value Ref Range Status   09/29/2021 48 MG/DL Final     Cholesterol   Date Value Ref Range Status   09/29/2021 217 (H) <200 MG/DL Final     Triglycerides   Date Value Ref Range Status   09/29/2021 241 (H) <150 MG/DL Final       Blood Glucose:  Hemoglobin A1C   Date Value Ref Range Status   09/29/2021 6.0 (H) 4.0 - 5.7 % Final     Comment:     The ADA recommends that most patients with type 1 and type 2 diabetes maintain   an A1c level <7%.         Second-generation antipsychotics (SGAs) are associated with metabolic dysregulation, including obesity, diabetes, dyslipidemia, and hypertension. The dramatic increase in diabetes, diabetic ketoacidosis, and death in the late 1990s and early 2000s prompted the FDA in August 2003 to apply a class warning to the labels of all SGAs and require that practitioners monitor metabolic parameters in patients receiving SGAs, before and after initiating therapy.    Doreen Beam, PHARMD

## 2021-09-29 NOTE — Progress Notes
PRN Medication Administered:  Zyprexa Zydis '5mg'$     Non pharmacological interventions attempted prior to PRN medication administration:  Decrease stimuli, redirection    Brief narrative of reason for PRN medication administration:   Pt appears restless tonight, up and down out of bed and unable to fall asleep. She approached the nurses station multiple times requesting to go to "[place]" and asking if it is past 7:00. Pt redirected and reoriented. She appears to be responding to internal stimuli and reports that Zyprexa Zydis is moderately effective. Pt offered and accepted PRN medication.

## 2021-09-29 NOTE — Care Plan
Problem: Harm to self, high risk of suicide  Goal: Absence of Harm to Self  Outcome: Goal Ongoing   Pt denies SI    Problem: Elopement Prevention  Goal: Patients without Decisional Capacity will not elope from the care setting  Outcome: Goal Ongoing     Problem: Mood - Altered  Goal: Stabilize mood  Outcome: Goal Ongoing  Goal: Knowledge of Altered Mood  Outcome: Goal Ongoing     Problem: Thought Process - Altered  Goal: Demonstration of organized thought processes  Outcome: Goal Ongoing     Problem: Violence, self/other-directed, Risk of  Goal: Absence of violence  Outcome: Goal Ongoing

## 2021-09-29 NOTE — Care Plan
Problem: Harm to self, high risk of suicide  Goal: Absence of Harm to Self  Outcome: Goal Ongoing  Flowsheets (Taken 09/29/2021 1218)  Absence of harm to self:   Assess and report significant changes in mood and affect   Maintain a safe environment   Reassess for suicide risk daily     Problem: Elopement Prevention  Goal: Patients without Decisional Capacity will not elope from the care setting  Outcome: Goal Ongoing     Problem: Mood - Altered  Goal: Stabilize mood  Outcome: Goal Ongoing  Flowsheets (Taken 09/27/2021 2137 by Anna Genre, RN)  Stabilize mood:   Assess coping style   Assess depression history   Assess depressive symptoms   Assess ineffective coping signs and symptoms   Establish therapeutic relationships

## 2021-09-29 NOTE — Group Note
Name: Kristin Maynard   MRN: [id number]     DOB: 1996-03-09      Age: 26 y.o.  Admission Date: 09/23/2021     LOS: 6 days     Date of Service: 09/29/2021      Group Topic: BH Mindfulness  Group Date: 09/28/2021  Start Time: 1245  End Time: 1345  Facilitators: Candie Chroman          Number of Participants: 6  Group Focus: increase awareness of breath through mindful art making exercise  Treatment Modality: Art Therapy  Interventions utilized were assignment and exploration  Purpose: enhance coping skills and reinforce self-care          Name: Kristin Maynard Date of Birth: 28-Jul-1995   MR: [id number]      Level of Participation: patient not present for group   Plan: to continue treatment

## 2021-09-30 NOTE — Group Note
Name: Kristin Maynard   MRN: [id number]     DOB: 1996-03-10      Age: 26 y.o.  Admission Date: 09/23/2021     LOS: 7 days     Date of Service: 09/30/2021      Group Topic: [place]  Group Date: 09/30/2021  Start Time: 1300  End Time: 1322  Facilitators: Punam Broussard          Number of Participants: 7  Group Focus: other Art gallery manager: Group Psychotherapy  Interventions utilized were patient education  Purpose: increase insight      Therapist educated Participants on methods to obtaining access to community resources.       Name: Kristin Maynard Date of Birth: 09-20-1995   MR: [id number]      Level of Participation: patient not present for group     Plan: to continue treatment

## 2021-09-30 NOTE — Progress Notes
Psychiatry Progress Note  Name: Kristin Maynard          MRN: [id number] DOB: February 14, 1996         Age: 26 y.o.  Admission Date: 09/23/2021  LOS: 7 days    Service: [facility] Adult Psych A    Principal Problem:    Schizophrenia (HCC)  Active Problems:    Pituitary macroadenoma (HCC)    Hyperprolactinemia (HCC)    Elevated BP without diagnosis of hypertension    Sinus tachycardia    Leukocytosis    PLAN  1. No medication changes made 09/30/2021    2. Continued admission to [facility]  3. Continue admission to red zone for disorganization and behaviors that are minimally redirectable and not appropriate for the general milieu at this time   4. Start Cabergoline 2.5mg  PO twice weekly starting on 3/20  1. Contacted Dr. Threasa Beards with [facility] and discussed with his nurse   1. Will continue to hold off on initiation of scheduled antipsychotics as patient received Abilify Maintena 400mg  on 2/24   5. Continue Trazodone 50mg  PO QHS  6. Continue Hydroxyzine 25mg  PO Q6H PRN  7. Continue Zyprexa 5mg  ODT/IM PO Q6H PRN     REASONS FOR CONTINUED HOSPITALIZATION  Psychiatric stabilization and medication management. Patient with decreased social support and at risk for recidivism if discharged at this time.    ______________________________________________________________  SUBJECTIVE  Overnight Events: Patient given Zydis 5 mg x 1 for agitation/decreased stimuli  Pt seen patient seen in the red zone today and reports that her mood is an 8.9/10 with 10 being the best.  She reports she slept okay her appetite is good she denied any depression noted 3/10 anxiety denied any SI/HI or AVH.    REVIEW OF SYSTEMS  Review of Systems   Constitutional: Negative for chills and fever.   Respiratory: Negative for shortness of breath.    Cardiovascular: Negative for chest pain.   Gastrointestinal: Negative for abdominal pain, constipation, diarrhea, nausea and vomiting.   Skin: Negative for rash.   Neurological: Negative for dizziness, light-headedness and headaches.     ______________________________________________________________  OBJECTIVE                 Vital Signs:  Current                Vital Signs: 24 Hour Range   BP: 154/99 (03/18 0700)  Temp: 35.9 ?C (96.7 ?F) (03/18 0700)  Pulse: 111 (03/18 0700)  Respirations: 16 PER MINUTE (03/18 0700)  SpO2: 97 % (03/18 0700) BP: (140-154)/(89-99)   Temp:  [35.9 ?C (96.7 ?F)-36.6 ?C (97.9 ?F)]   Pulse:  [111-117]   Respirations:  [16 PER MINUTE-18 PER MINUTE]   SpO2:  [97 %-100 %]    Intensity Pain Scale (Self Report): (not recorded)      Scheduled Medications:  levonorgestrel-ethinyl estradiol (AVIANE-28, LESSINA-28) tablet 1 tablet, 1 tablet, Oral, QDAY  lidocaine (LIDODERM) 5 % topical patch 1 patch, 1 patch, Topical, QDAY  metoprolol tartrate tablet 25 mg, 25 mg, Oral, QDAY  traZODone (DESYREL) tablet 50 mg, 50 mg, Oral, QHS        PRN Medications:  acetaminophen Q6H PRN 650 mg at 09/28/21 1154, calcium carbonate TID PRN, hydrOXYzine Q6H PRN 25 mg at 09/29/21 0817, melatonin QHS PRN 5 mg at 09/28/21 2103, OLANZapine Q6H PRN 5 mg at 09/30/21 0123 **OR** OLANZapine Q6H PRN, polyethylene glycol 3350 QDAY PRN, senna/docusate QDAY PRN    Mental Status  Exam:  Mental Status Evaluation:    General/Constitutional: Appears stated age, in hospital attire, good hygiene and grooming.   Eye Contact: Fair  Behavior: Appropriate and cooperative  Speech: regular rate and rhythm, normal tone and volume.   Mood: Okay  Affect: Content, mood congruent  Thought Process: Linear, organized, easy to follow and understand.  Thought Content: Denies SI/HI.   Perception: Denies AVH. No evidence of paranoia, delusions, or illusions.   Associations: Intact  Insight/Judgment: Fair/Fair    Orientation: grossly intact  Recent and remote memory: intact  Attention span and concentration: appropriate  Language: fluent, English speaker  Fund of knowledge and vocabulary: appropriate    Focused Physical Exam:  Physical Exam  Musculoskeletal: Right lower leg: No edema.      Left lower leg: No edema.   Neurological:      Motor: No atrophy.      Gait: Gait normal.          ______________________________________________________________  Rae Mar, MD

## 2021-09-30 NOTE — Group Note
Name: Kristin Maynard   MRN: [id number]     DOB: [birth year]      Age: 26 y.o.  Admission Date: 09/23/2021     LOS: 7 days     Date of Service: 09/30/2021      Group Topic: [place] Leisure Activities  Group Date: 09/30/2021  Start Time: 0915  End Time: 1000  Facilitators: Harrell Lark          Number of Participants: 9  Group Focus: communication, concentration, coping skills, feeling awareness/expression, leisure skills, music therapy, relaxation, reminiscence, and social skills  Treatment Modality: Music Therapy  Interventions utilized were other [place] and Music  Purpose: enhance coping skills, express feelings, improve communication skills, and reinforce self-care    [place] and Music: The therapist provided patients with mandala sheets and markers. While working on their mandalas patients selected music that helps them to relax. The purposes of this intervention was to promote feeling awareness/expression, enhance coping skills, promote relaxation, mindfulness, and leisure skills.      Name: Kristin Maynard Date of Birth: 15-Mar-1996   MR: [id number]      Level of Participation: patient not present for group   Plan: to continue treatment

## 2021-09-30 NOTE — Care Plan
Problem: Mood - Altered  Goal: Stabilize mood  Outcome: Goal Ongoing  Flowsheets (Taken 09/30/2021 1055)  Stabilize mood:   Assess ineffective coping signs and symptoms   Establish therapeutic relationships     Problem: Self-esteem - Low  Goal: Demonstration of positive self-esteem  Outcome: Goal Ongoing  Flowsheets (Taken 09/30/2021 1056)  Demonstration of positive self-esteem: Provide positive reinforcement     Problem: Thought Process - Altered  Goal: Demonstration of organized thought processes  Outcome: Goal Ongoing  Flowsheets (Taken 09/30/2021 1055)  Demonstration of organized thought processes:   Provide environmental regulation - agitation   Assess in reality orientation

## 2021-09-30 NOTE — Progress Notes
PRN Medication Administered:  Atarax 25 mg  Non pharmacological interventions attempted prior to PRN medication administration:  Verbal de-escalation techniques and Decrease stimuli    Brief narrative of reason for PRN medication administration: PRN given for anxiety after pt awoke from a nightmare. She reports feeling scared and anxious and talks about how she can see Pikachu. Will continue to monitor.

## 2021-09-30 NOTE — Group Note
Name: Kristin Maynard   MRN: [id number]     DOB: [birth year]      Age: 26 y.o.  Admission Date: 09/23/2021     LOS: 7 days     Date of Service: 09/30/2021      Group Topic: BH Connection  Group Date: 09/30/2021  Start Time: 1115  End Time: 1200  Facilitators: Harrell Lark          Number of Participants: 7  Group Focus: communication, concentration, coping skills, feeling awareness/expression, leisure skills, music therapy, relaxation, reminiscence, and social skills  Treatment Modality: Music Therapy  Interventions utilized were other Mental Health Albums  Purpose: enhance coping skills, express feelings, improve communication skills, and reinforce self-care    Mental Health Albums: The patient were provided the mental health playlist worksheet, colored pencils, and pens. The patients were instructed to complete the front side of the worksheet, containing ten categories, with the appropriate songs and on the back provide the cover art for their album. While working, the patients listened to quiet ambient music; when patients shared their playlists, they selected a song to hear from their list. The purposes of this intervention was to increase social skills, leisure skills, connections, communication, express feelings, and self-care        Name: Kristin Maynard Date of Birth: [birth year]   MR: [id number]      Level of Participation: patient not present for group   Plan: to continue treatment

## 2021-09-30 NOTE — Progress Notes
PRN Medication Administered:  PO Zyprexa zydis '5mg'$   Non pharmacological interventions attempted prior to PRN medication administration:  Decrease stimuli, verbal redirection and de-escalation techniques  Brief narrative of reason for PRN medication administration:   Pt states she is "pissed off" due to not being able to see her family. States she "does not need to be here." Pt came out of room and frequently asked staff what time it is. States she is waiting for it to be breakfast time.

## 2021-09-30 NOTE — Care Plan
Problem: Mood - Altered  Goal: Stabilize mood  Outcome: Goal Ongoing  Flowsheets (Taken 09/29/2021 2258)  Stabilize mood: Assess depressive symptoms     Problem: Thought Process - Altered  Goal: Demonstration of organized thought processes  Outcome: Goal Ongoing  Flowsheets (Taken 09/29/2021 2258)  Demonstration of organized thought processes:   Assess disturbed thought process signs and symptoms   Assess in reality orientation   Assess thought process   Provide environmental regulation - agitation     Problem: Violence, self/other-directed, Risk of  Goal: Absence of violence  Outcome: Goal Ongoing  Flowsheets (Taken 09/29/2021 2258)  Absence of violence:   Suicide assessment   Supervise medication intake   Suicide precautions

## 2021-10-01 MED ORDER — CABERGOLINE 0.5 MG PO TAB
.5 mg | ORAL | 0 refills | Status: DC
Start: 2021-10-01 — End: 2021-10-24
  Administered 2021-10-02 – 2021-10-23 (×7): 0.5 mg via ORAL

## 2021-10-01 NOTE — Group Note
Name: Kristin Maynard   MRN: [id number]     DOB: Jul 28, 1995      Age: 26 y.o.  Admission Date: 09/23/2021     LOS: 8 days     Date of Service: 10/01/2021      Group Topic: [place] Leisure Activities  Group Date: 10/01/2021  Start Time: 1100  End Time: 1200  Facilitators: Charlotte Crumb          Number of Participants: 6  Group Focus: communication, community group, feeling awareness/expression, relaxation, self-awareness, and Presenter, broadcasting: Leisure Development  Interventions utilized were group exercise, leisure development, story telling, and support  Purpose: express feelings, improve communication skills, and reinforce self-care      Patients played a Insurance account manager game that involved rolling a dice and answering questions in order based on what the patient rolls. Questions were insightful and required patients to talk about their personal beliefs and/or experiences.     Name: Kristin Maynard Date of Birth: 04/30/1996   MR: [id number]      Level of Participation: patient not present for group   Plan: to continue treatment

## 2021-10-01 NOTE — Care Plan
Problem: Harm to self, high risk of suicide  Goal: Absence of Harm to Self  Outcome: Goal Ongoing  Flowsheets (Taken 10/01/2021 1204)  Absence of harm to self:   Assess and report significant changes in mood and affect   Maintain a safe environment     Problem: Mood - Altered  Goal: Stabilize mood  Outcome: Goal Ongoing  Flowsheets (Taken 10/01/2021 1204)  Stabilize mood:   Assess coping style   Assess ineffective coping signs and symptoms     Problem: Thought Process - Altered  Goal: Demonstration of organized thought processes  Outcome: Goal Ongoing  Flowsheets (Taken 10/01/2021 1204)  Demonstration of organized thought processes:   Provide environmental regulation - agitation   Assess in reality orientation   Assess disturbed thought process signs and symptoms     Problem: Violence, self/other-directed, Risk of  Goal: Absence of violence  Outcome: Goal Ongoing  Flowsheets (Taken 10/01/2021 1204)  Absence of violence:   Violent behavior risk assessment   Environmental safety management

## 2021-10-01 NOTE — Progress Notes
PRN Medication Administered:  Atarax '25mg'$     Non pharmacological interventions attempted prior to PRN medication administration.  - decreased stimuli, dimmed lighting    Brief narrative of reason for PRN medication administration:     - Pt is awake repeatedly returning to nurse's station asking, " Is it 7am yet?." When pt comes up to  Nurses station, she appears to get more agitated each time.

## 2021-10-01 NOTE — Progress Notes
Pharmacy Note: Patients Own Med    The following medications have been identified by pharmacy and placed in the [facility] for storage:    Cabergoline 0.'5mg'$  tablets  Dispensed 09/29/21  Exp 09/29/22    The patient may use their own supply of these medications during this admission.  All doses should be administered and documented per hospital policy.

## 2021-10-01 NOTE — Group Note
Name: Maranatha Grossi   MRN: [id number]     DOB: 1995-08-11      Age: 26 y.o.  Admission Date: 09/23/2021     LOS: 8 days     Date of Service: 10/01/2021      Group Topic: [facility] Values/Moral Compass  Group Date: 10/01/2021  Start Time: 1400  End Time: 1500  Facilitators: Tenny Craw          Number of Participants: 8  Group Focus: acceptance, affirmation, coping skills, feeling awareness/expression, and self-esteem  Treatment Modality: Psychoeducation  Interventions utilized were exploration, group exercise, and support  Purpose: express feelings, increase insight, and reinforce self-care      Group members read and discussed handout addressing Core Beliefs. Group members also identified with handout  a list of common core beliefs, consequences of core beliefs and shared personal experiences .        Name: Jessica Date of Birth: Aug 24, 1995   MR: [id number]      Level of Participation: patient not present for group   Plan: to continue treatment

## 2021-10-01 NOTE — Progress Notes
General Medicine [facility] Progress Note         Name: Tenille Armenteros        MRN: [id number]          DOB: May 29, 1996            Age: 26 y.o.  Admission Date: 09/23/2021       LOS: 8 days    Date of Service: 10/01/2021                     Assessment  Principal Problem:    Schizophrenia (HCC)  Active Problems:    Pituitary macroadenoma (HCC)    Hyperprolactinemia (HCC)    Elevated BP without diagnosis of hypertension    Sinus tachycardia    Leukocytosis      26 y.o. female admitted to [facility] with the following medical issues:  1. Schizophrenia, Acute psychosis  2. Moderate hypertriglyceridemia  - Lipid panel 11/17/18: Total cholesterol 216, HDL 48, LDL 120, Triglycerides 238  - Lipid panel 09/29/21: total cholesterol 217, HDL 55, LDL 131, Triglycerides 241  3. Pituitary macroadenoma, Hyperprolactinemia  - Prolactin 303 (11/17/18) -> 479.8 (10/20/19) -> 583 (12/09/19) -> 651.5 (03/30/20) -> 814.7 (09/01/20) -> 925.9 (11/09/20)  - switched from bromocriptine to cabergoline by primary team, first dose on 3/20 planned  4. Sinus tachycardia  - Per chart review HR in 100-130s throughout hospital stay, most commonly 100-120 range  - blood pressures stable to hypertensive throughout hospital stay thus far  - Metoprolol resumed 3/15  - TSH 1.06  - CXR 09/23/21 with no acute cardiopulmonary abnormalities  - urine culture 3/16 with < 100,000 CF urogenital flora. Received 3 days of macrobid  5. Obesity, Metabolic syndrome - BMI 35.04  6. Contraception  - PTA on volnea, receving levonorgestral-ethinyl estradiol inpatient  - urine pregnancy negative 09/23/21      Recommendations:  - ekg ordered, however patient declined for nursing  - orthostatic vital signs as tachycardia could be a compensatory response and olanzapine can cause orthostatic hypotension  - CBC, CMP, Mag for AM lab workup/monitoring of tachycardia  - will defer treatment of pituitary macroadenoma/elevated prolactin to patient's endocrinology team which it appears the primary psychiatry team has been in contact with. If changes are needed, recommend ongoing involvement on endocrinology. Patient will need close endocrine follow up.  - will defer treatment of acute psychiatric illness to psychiatry team    Total Time Today was 35 minutes in the following activities: Preparing to see the patient, Obtaining and/or reviewing separately obtained history, Performing a medically appropriate examination and/or evaluation, Counseling and educating the patient/family/caregiver, Ordering medications, tests, or procedures, Documenting clinical information in the electronic or other health record and Care coordination (not separately reported)    The patient is admitted to inpatient psychiatric unit.  The inpatient psychiatric team is following.  Internal medicine team was consulted for medical evaluation.  I reviewed patient medical history, vitals, medications, labs and available medical records  .  Thanks for the consult , the patient has active medical issues that need monitoring and we will follow with you    General medicine can be reached out by Looking up [facility] on Voalte or Cureatr app for direct app communication    or by paging [phone]      Dorann Ou, MD  Pager [phone]   _________________________________________________________    Subjective  Nhu Skahill is a 26 y.o. female.  Patient reports that she  occasionally has palpitations. She reports that she has had the palpitations off and on for a week due to panic attacks. She denies any known cardiac history. She is unsure if she takes any cardiac medications.    She reports ongoing anxiety. She denies any fevers, chills. She has some mild hip pain from sleeping on her left hip. She denies any dysuria, frequency, shortness of breath.     Medications  Scheduled Meds:levonorgestrel-ethinyl estradiol (AVIANE-28, LESSINA-28) tablet 1 tablet, 1 tablet, Oral, QDAY  lidocaine (LIDODERM) 5 % topical patch 1 patch, 1 patch, Topical, QDAY  metoprolol tartrate tablet 25 mg, 25 mg, Oral, QDAY  traZODone (DESYREL) tablet 50 mg, 50 mg, Oral, QHS    Continuous Infusions:  PRN and Respiratory Meds:acetaminophen Q6H PRN, calcium carbonate TID PRN, hydrOXYzine Q6H PRN, melatonin QHS PRN, OLANZapine Q6H PRN **OR** OLANZapine Q6H PRN, polyethylene glycol 3350 QDAY PRN, senna/docusate QDAY PRN      Review of Systems: negative except as noted above    Objective:                          Vital Signs: Last Filed                 Vital Signs: 24 Hour Range   BP: 151/100 (03/19 0800)  Temp: 36.6 ?C (97.9 ?F) (03/19 0800)  Pulse: 134 (03/19 0800)  Respirations: 18 PER MINUTE (03/19 0800)  SpO2: 96 % (03/19 0800) BP: (141-151)/(91-100)   Temp:  [36.2 ?C (97.1 ?F)-36.6 ?C (97.9 ?F)]   Pulse:  [110-134]   Respirations:  [16 PER MINUTE-18 PER MINUTE]   SpO2:  [96 %-99 %]      Vitals:    09/23/21 1034 09/23/21 2120 09/26/21 1421   Weight: 88.5 kg (195 lb) 88.1 kg (194 lb 3.6 oz) 86.9 kg (191 lb 9.6 oz)       Intake/Output Summary:  (Last 24 hours)  No intake or output data in the 24 hours ending 10/01/21 0951        Physical Exam:  Gen: alert,  cooperative and no distress   Head: Normocephalic  Eyes: conjunctivae/corneas clear  Neck: supple, symmetrical  Lungs: clear to auscultation bilaterally, no wheezes, rales, rhonchi   Heart: tachycardia, regular rhythm. HR ~115.S1, S2 normal, no murmur, click, rub or gallop   Neurologic: Grossly normal      Lab Review  24-hour labs:  No results found for this visit on 09/23/21 (from the past 24 hour(s)).    Point of Care Testing  (Last 24 hours)         Dorann Ou, MD  Available on Voalte or Cureatr app under [facility]    pager [phone]

## 2021-10-01 NOTE — Group Note
Name: Kristin Maynard   MRN: [id number]     DOB: 02-13-1996      Age: 26 y.o.  Admission Date: 09/23/2021     LOS: 8 days     Date of Service: 10/01/2021      Group Topic: BH Coping Skills  Group Date: 10/01/2021  Start Time: 0930  End Time: 4268  Facilitators: Earl Lites          Number of Participants: 7  Group Focus: coping skills, leisure skills, relaxation, self-awareness, self-esteem, and social skills  Treatment Modality: Recreation Therapy  Interventions utilized were group exercise and leisure development  Purpose: enhance coping skills, improve communication skills, increase insight, regain self-worth, and reinforce self-care    Patients exercised to music to help improve their fitness level. Patients participated in meditation to help decrease stress and anxiety. Patients participated in pet therapy and shared stories about their pets. While waiting to pet the dog they were given an animal and dog word search puzzle.      Name: Kristin Maynard Date of Birth: [birth year]   MR: [id number]      Level of Participation: active   Quality of Participation: attentive and cooperative during the beginning of group.  Interactions with others: gave feedback  Mood/Affect: flat, irritable and restless in the second half of group  Cognition: goal directed  Progress: Moderate  Response: Patient left group for a while and came back to pet the therapy dog. Patient was inpatient while waiting to pet the dog. Patient asked a couple of times when it was going to be her turn.   Plan: to continue treatment

## 2021-10-01 NOTE — Progress Notes
Psychiatry Progress Note  Name: Kristin Maynard          MRN: [id number] DOB: 1996/07/01         Age: 26 y.o.  Admission Date: 09/23/2021  LOS: 8 days    Service: [facility]    Principal Problem:    Schizophrenia (HCC)  Active Problems:    Pituitary macroadenoma (HCC)    Hyperprolactinemia (HCC)    Elevated BP without diagnosis of hypertension    Sinus tachycardia    Leukocytosis    PLAN  1. No medication changes made 10/01/2021    2. Continued admission to [facility]  3. Continue admission to red zone for disorganization and behaviors that are minimally redirectable and not appropriate for the general milieu at this time   4. Start Cabergoline 2.5mg  PO twice weekly starting on 3/20  1. Contacted Dr. Threasa Beards with [facility] and discussed with his nurse   1. Will continue to hold off on initiation of scheduled antipsychotics as patient received Abilify Maintena 400mg  on 2/24   5. Continue Trazodone 50mg  PO QHS  6. Continue Hydroxyzine 25mg  PO Q6H PRN  7. Continue Zyprexa 5mg  ODT/IM PO Q6H PRN     REASONS FOR CONTINUED HOSPITALIZATION  Psychiatric stabilization and medication management. Patient with decreased social support and at risk for recidivism if discharged at this time.    ______________________________________________________________  SUBJECTIVE  Overnight Events: Patient given Atarax 25 mg x 2 for agitation/decreased stimuli  Patient seen in the red zone today and reports that she slept better last night her appetite is good her mood overall is very good.  She denies any depression or anxiety denies any SI/HI or VH.  Reports auditory hallucinations and rates it as a 1 out of 10 intensity she reports commentary voices.    Patient has been out of the red zone and participating in groups and seems to be tolerating this well.  We will look to stepdown in the red zone over the coming days.    REVIEW OF SYSTEMS  Review of Systems   Constitutional: Negative for chills and fever.   Respiratory: Negative for shortness of breath.    Cardiovascular: Negative for chest pain.   Gastrointestinal: Negative for abdominal pain, constipation, diarrhea, nausea and vomiting.   Skin: Negative for rash.   Neurological: Negative for dizziness, light-headedness and headaches.     ______________________________________________________________  OBJECTIVE                 Vital Signs:  Current                Vital Signs: 24 Hour Range   BP: 151/100 (03/19 0800)  Temp: 36.6 ?C (97.9 ?F) (03/19 0800)  Pulse: 134 (03/19 0800)  Respirations: 18 PER MINUTE (03/19 0800)  SpO2: 96 % (03/19 0800) BP: (141-151)/(91-100)   Temp:  [36.2 ?C (97.1 ?F)-36.6 ?C (97.9 ?F)]   Pulse:  [110-134]   Respirations:  [16 PER MINUTE-18 PER MINUTE]   SpO2:  [96 %-99 %]    Intensity Pain Scale (Self Report): (not recorded)      Scheduled Medications:  levonorgestrel-ethinyl estradiol (AVIANE-28, LESSINA-28) tablet 1 tablet, 1 tablet, Oral, QDAY  lidocaine (LIDODERM) 5 % topical patch 1 patch, 1 patch, Topical, QDAY  metoprolol tartrate tablet 25 mg, 25 mg, Oral, QDAY  traZODone (DESYREL) tablet 50 mg, 50 mg, Oral, QHS        PRN Medications:  acetaminophen Q6H PRN 650 mg at  09/28/21 1154, calcium carbonate TID PRN, hydrOXYzine Q6H PRN 25 mg at 10/01/21 0240, melatonin QHS PRN 5 mg at 09/30/21 2111, OLANZapine Q6H PRN 5 mg at 09/30/21 0123 **OR** OLANZapine Q6H PRN, polyethylene glycol 3350 QDAY PRN, senna/docusate QDAY PRN    Mental Status Exam:  Mental Status Evaluation:    General/Constitutional: Appears stated age, in hospital attire, good hygiene and grooming.   Eye Contact: Fair  Behavior: Appropriate and cooperative  Speech: regular rate and rhythm, normal tone and volume.   Mood: Good  Affect: Tent, mood congruent  Thought Process: Linear, organized, easy to follow and understand.  Thought Content: Denies SI/HI.   Perception: Denies VH.  Endorses AH.  No evidence of paranoia, delusions, or illusions.   Associations: Intact  Insight/Judgment: Fair/fair    Focused Physical Exam:  Physical Exam  Musculoskeletal:      Right lower leg: No edema.      Left lower leg: No edema.   Neurological:      Motor: No atrophy.      Gait: Gait normal.          ______________________________________________________________  Rae Mar, MD

## 2021-10-01 NOTE — Care Plan
Problem: Mood - Altered  Goal: Stabilize mood  Flowsheets (Taken 10/01/2021 0114)  Stabilize mood:   Assess coping style   Establish therapeutic relationships   Assess ineffective coping signs and symptoms  Goal: Knowledge of Altered Mood  Flowsheets (Taken 10/01/2021 0114)  Knowledge of altered mood:   Provide relaxation techniques education   Provide psychosocial intervention education     Problem: Thought Process - Altered  Goal: Demonstration of organized thought processes  Flowsheets (Taken 10/01/2021 0114)  Demonstration of organized thought processes:   Assess cognitive ability   Assess disturbed thought process signs and symptoms

## 2021-10-01 NOTE — Group Note
Name: Kristin Maynard   MRN: [id number]     DOB: [birth year]      Age: 26 y.o.  Admission Date: 09/23/2021     LOS: 8 days     Date of Service: 10/01/2021      Group Topic: [place] Leisure Activities  Group Date: 10/01/2021  Start Time: 1330  End Time: 1430  Facilitators: Earl Lites          Number of Participants: 5  Group Focus: coping skills, leisure skills, self-esteem, and social skills  Treatment Modality: Recreation Therapy  Interventions utilized were group exercise and leisure development  Purpose: enhance coping skills, regain self-worth, and reinforce self-care      Patients played team Sequence. Each team worked together to get two, 5 in a row. Learning games can help expand your leisure interest.    Name: Kristin Maynard Date of Birth: 1995-12-02   MR: [id number]      Level of Participation: patient not present for group   Plan: to continue treatment

## 2021-10-02 MED ORDER — QUETIAPINE 100 MG PO TAB
100 mg | ORAL | 0 refills | Status: DC | PRN
Start: 2021-10-02 — End: 2021-10-09
  Administered 2021-10-02 – 2021-10-09 (×10): 100 mg via ORAL

## 2021-10-02 MED ORDER — MELATONIN 5 MG PO TAB
5 mg | Freq: Every evening | ORAL | 0 refills | Status: DC
Start: 2021-10-02 — End: 2021-11-02
  Administered 2021-10-03 – 2021-11-02 (×31): 5 mg via ORAL

## 2021-10-02 MED ORDER — TRAZODONE 100 MG PO TAB
100 mg | Freq: Every evening | ORAL | 0 refills | Status: DC
Start: 2021-10-02 — End: 2021-10-17
  Administered 2021-10-03 – 2021-10-17 (×15): 100 mg via ORAL

## 2021-10-02 NOTE — Progress Notes
Attempted to obtain EKG, pt adamantly declined and began yelling at unseen others. Attempted to educate pt on the rationale of obtaining an EKG, no evidence of learning.

## 2021-10-02 NOTE — Progress Notes
PRN Medication Administered:  Seroquel '100mg'$  PO. Mildly effective, still responding to internal stimuli and having full blown conversations with unseen others; however, intensity of her responses to internal stimuli appears to have decreased AEB talking in a quieter voice, not yelling, pounding her fists on the wall.    Non pharmacological interventions attempted prior to PRN medication administration:  Verbal de-escalation techniques, Decrease stimuli and Increase structure and limit setting    Brief narrative of reason for PRN medication administration: Pt observed in her room, yelling at unseen others, pounding her fists on the wall. When this RN approached and offered PRN, pt began yelling "I need to be condemned!" while violently ripping her wristband off (in medication cart).

## 2021-10-02 NOTE — Care Plan
Problem: Elopement Prevention  Goal: Patients without Decisional Capacity will not elope from the care setting  Outcome: Goal Ongoing     Problem: Mood - Altered  Goal: Stabilize mood  Outcome: Goal Ongoing  Flowsheets (Taken 10/02/2021 0940)  Stabilize mood:   Assess coping style   Assess ineffective coping signs and symptoms   Establish therapeutic relationships     Problem: Self-esteem - Low  Goal: Demonstration of positive self-esteem  Outcome: Goal Ongoing  Flowsheets (Taken 10/02/2021 0940)  Demonstration of positive self-esteem: Provide positive reinforcement     Problem: Thought Process - Altered  Goal: Demonstration of organized thought processes  Outcome: Goal Ongoing  Flowsheets (Taken 10/02/2021 0940)  Demonstration of organized thought processes:   Assess cognitive ability   Assess disturbed thought process signs and symptoms   Assess risk for impaired cognition   Assess thought process   Assess in reality orientation

## 2021-10-02 NOTE — Progress Notes
Psychiatry Progress Note  Name: Kristin Maynard          MRN: [id number] DOB: 02/10/1996         Age: 26 y.o.  Admission Date: 09/23/2021  LOS: 9 days    Service: [facility] Adult [facility]    Principal Problem:    Schizophrenia (HCC)  Active Problems:    Pituitary macroadenoma (HCC)    Hyperprolactinemia (HCC)    Elevated BP without diagnosis of hypertension    Sinus tachycardia    Leukocytosis    PLAN  1. No medication changes made 10/02/21    2. Continued admission to [facility]  3. Continue admission to red zone for disorganization and behaviors that are minimally redirectable and not appropriate for the general milieu at this time   4. Continue Cabergoline 2.5mg  PO twice weekly (started on 3/20)  5. Abilify Maintena 400mg  last administered on 2/24  6. Discontinue Zyprexa PRN and initiate Seroquel 100mg  q6h PRN for psychosis/agitation  7. Increase Trazodone to 100mg  qhs      REASONS FOR CONTINUED HOSPITALIZATION  Psychiatric stabilization and medication management. Patient with decreased social support and at risk for recidivism if discharged at this time.    ______________________________________________________________  Kristin Maynard was seen today in the red zone. She was sitting on the bench staring and stated she was okay today. She did not sleep well but appetite has been okay. She reports having gas today. When asked about voices she paused and stated actually no but appeared to be responding to stimuli at times. She reported VH of monsters earlier in the morning. She denies SI stating she wants to live for her family. She denies HI.     Kristin Maynard has continued to need one dose of zydis daily for hallucinations. She slept 0 hours last night.     REVIEW OF SYSTEMS  Review of Systems   Constitutional: Negative for chills and fever.   Respiratory: Negative for shortness of breath.    Cardiovascular: Negative for chest pain.   Gastrointestinal: Positive for abdominal pain. Negative for constipation, diarrhea, nausea and vomiting.   Skin: Negative for rash.   Neurological: Negative for dizziness, light-headedness and headaches.   Psychiatric/Behavioral: Positive for hallucinations and sleep disturbance. Negative for suicidal ideas.     ______________________________________________________________  OBJECTIVE                 Vital Signs:  Current                Vital Signs: 24 Hour Range   BP: 147/106 (03/20 0848)  Temp: 36.3 ?C (97.3 ?F) (03/19 2017)  Pulse: 128 (03/20 0848)  Respirations: 18 PER MINUTE (03/19 2017)  SpO2: 97 % (03/19 2017) BP: (140-147)/(83-106)   Temp:  [36.3 ?C (97.3 ?F)]   Pulse:  [122-128]   Respirations:  [18 PER MINUTE]   SpO2:  [97 %]    Intensity Pain Scale (Self Report): (not recorded)      Scheduled Medications:  cabergoline (DOSTINEX) (+) tablet 0.5 mg **PATIENT OWN MED**, 0.5 mg, Oral, Once per day on Mon Thu  levonorgestrel-ethinyl estradiol (AVIANE-28, LESSINA-28) tablet 1 tablet, 1 tablet, Oral, QDAY  lidocaine (LIDODERM) 5 % topical patch 1 patch, 1 patch, Topical, QDAY  melatonin tablet 5 mg, 5 mg, Oral, QHS  metoprolol tartrate tablet 25 mg, 25 mg, Oral, QDAY  traZODone (DESYREL) tablet 100 mg, 100 mg, Oral, QHS        PRN Medications:  acetaminophen Q6H  PRN 650 mg at 09/28/21 1154, calcium carbonate TID PRN, hydrOXYzine Q6H PRN 25 mg at 10/01/21 0240, [DISCONTINUED] OLANZapine Q6H PRN 5 mg at 10/02/21 0348 **OR** OLANZapine Q6H PRN, polyethylene glycol 3350 QDAY PRN, QUEtiapine Q6H PRN 100 mg at 10/02/21 1256, senna/docusate QDAY PRN    MENTAL STATUS EXAMINATION  General/Constitutional: appears stated age, dressed appropriately, good hygiene   Eye Contact: fair  Behavior: Calm, cooperative; appropriate for conversation  Speech: RRR with normal volume and tone. Good articulation. Delays in responses at times   Mood: okay  Affect: flat  Thought Process: mostly linear   Thought Content: denies SI, HI. Evidence of delusions   Perception: Denies current AVH. Reports recent AH and VH. Appears to be responding to stimuli   Associations: Intact  Insight/Judgment: impaired/impaired     Physical Exam:  Gait: Ambulates without difficulty. Normal arm swing   ______________________________________________________________  [name], DO

## 2021-10-02 NOTE — Group Note
Name: Zionna Homewood   MRN: [id number]     DOB: [birth year]      Age: 26 y.o.  Admission Date: 09/23/2021     LOS: 9 days     Date of Service: 10/02/2021      Group Topic: BH Relaxation and Self-Soothing  Group Date: 10/02/2021  Start Time: 1000  End Time: 1045  Facilitators: Charlotte Crumb          Number of Participants: 8  Group Focus: check in, community group, leisure skills, and relaxation  Treatment Modality: Leisure Development  Interventions utilized were group exercise and leisure development  Purpose: reinforce self-care    Patients played a game of UNO while discussing their favorite relaxation methods.           Name: Talibah Date of Birth: [birth year]   MR: [id number]      Level of Participation: active   Quality of Participation: attentive and cooperative  Interactions with others: did not interact  Mood/Affect: appropriate and brightens with interaction  Triggers (if applicable):   Cognition: processing slowly  Progress: Moderate  Response:   Plan: to continue treatment

## 2021-10-02 NOTE — Group Note
Name: Blimy Napoleon   MRN: [id number]     DOB: Sep 18, 1995      Age: 26 y.o.  Admission Date: 09/23/2021     LOS: 9 days     Date of Service: 10/02/2021      Group Topic: BH Personal Values  Group Date: 10/02/2021  Start Time: 1100  End Time: 1150  Facilitators: Annett Fabian          Number of Participants: 11  Group Focus: Acceptance and Commitment Therapy: Values identification and determining committed action  Treatment Modality: Acceptance and Commitment Therapy  Interventions utilized were exploration, group exercise, and patient education  Purpose: Identify values and committed action           Name: Annalycia Date of Birth: 07-Feb-1996   MR: [id number]      Level of Participation: active   Quality of Participation: attentive and cooperative  Interactions with others: did not interact  Mood/Affect: angry and frustrated  Cognition: unable to assess  Progress: Minimal   Response: The pt was engaged for most of the group session. About 30 minutes into the session, the pt crumpled up the group handouts and put her head down on the desk. She asked to be excused to her room shortly after.   Plan: to continue treatment

## 2021-10-02 NOTE — Progress Notes
Psychotherapy Progress Note    NAME:Ramani Delynn Olvera MRN: [id number] DOB:1995-09-11 AGE: 26 y.o.  ADMISSION DATE: 09/23/2021 DAYS ADMITTED: LOS: 9 days    Date of Service:  10/02/21    Principal Problem:    Schizophrenia ([place])  Active Problems:    Pituitary macroadenoma ([place])    Hyperprolactinemia (HCC)    Elevated BP without diagnosis of hypertension    Sinus tachycardia    Leukocytosis    Objective:  Tyeesha will report a decrease in psychoses at least 24 hours prior to discharge.    Narrative:  SW met with patient on Red Zone for individual therapy check-in session. Patient reports feeling "pretty good" and states she is feeling safe due to the "people around me". Patient does still endorse both AH and VH of "slaves" as well as delusional thoughts. Patient states these voices are calling her cuss words, causing her to feel angry at the voices. Patient states when she is at baseline, she does not experience hallucinations. SW asked for patient to discuss her coping skills when feeling upset by the voices. Patient states she does have some good voices that tell her to "be both good and evil". SW and patient processed further. Patient states she enjoys breathing exercises and reading as a way to calm down. Patient does still endorse some delusional thoughts and paranoia, stating "Everyone is watching me". When asked to elaborate, patient stated, "the Universe is watching me". Patient shows slightly improved insight into warning signs of distress. SW ensured any additional questions/concerns were addressed prior to ending session.     Follow up:  Will continue to meet with patient to assess AVH.

## 2021-10-02 NOTE — Group Note
Name: Briggett Tuccillo   MRN: [id number]     DOB: 1996/05/19      Age: 26 y.o.  Admission Date: 09/23/2021     LOS: 9 days     Date of Service: 10/02/2021      Group Topic: BH Self-Care  Group Date: 10/02/2021  Start Time: 0915  End Time: 1000  Facilitators: Earl Lites          Number of Participants: 9  Group Focus: leisure skills, self-awareness, self-esteem, and social skills  Treatment Modality: Recreation Therapy  Interventions utilized were exploration and leisure development  Purpose: express feelings, improve communication skills, increase insight, regain self-worth, and reinforce self-care    Patients exercised and walked to music to help improve their fitness level. Patients filled out a worksheet, "What do I Want to Change and the benefits of making that change." Patients shared their name,changes they would like to make and the benefits of making that change. The group then talked about barriers that get in the way of their enjoyment.       Name: Sofiya Date of Birth: 23-Jan-1996   MR: [id number]      Level of Participation: active   Quality of Participation: attentive and cooperative  Interactions with others: gave feedback  Mood/Affect: appropriate and flat  Cognition: goal directed  Progress: Moderate  Response: Patient shared what she would like to change and the benefits of making those changes.   Plan: to continue treatment

## 2021-10-02 NOTE — Behavioral Health Treatment Team
TREATMENT TEAM NOTE  Name: Kristin Maynard          MRN: [id number]              DOB: 18-Jan-1996          Age: 26 y.o.  Admission Date: 09/23/2021               Attendees:   Psychiatrist: Arnette Norris, DO  Nurse Supervisor: Cyndia Skeeters, RN, BSN  Nurse: Dede Query, RN  Pharmacist: Lurlean Nanny, PharmD  Therapist: Dewayne Hatch, [place], [place]  Therapist: Neita Carp, [place]  Case Manager: Carmela Rima, LMSW  Case Manager: Tinnie Gens, LMSW  Utilization Review: Areatha Keas, RN    Significant Points Discussed: Reporting AVH. Did not sleep last night, pacing, staring. PRNs given. No sleep.      Treatment Plan Discussion: Continue treatment. Started new medication.      Projected Discharge Date: Possible end of week

## 2021-10-02 NOTE — Progress Notes
Assumed pt care at 1515. Pt observed pacing throughout red zone, responding to internal stimuli as evidenced by yelling at unseen others. Pt is redirectable. Pt did not voice any needs or concerns.

## 2021-10-02 NOTE — Care Plan
Problem: Mood - Altered  Goal: Stabilize mood  Flowsheets (Taken 10/01/2021 1204 by Schweigert, Larena Glassman, RN)  Stabilize mood:   Assess coping style   Assess ineffective coping signs and symptoms  Goal: Knowledge of Altered Mood  Flowsheets (Taken 10/01/2021 0114 by Jamas Lav, RN)  Knowledge of altered mood:   Provide relaxation techniques education   Provide psychosocial intervention education     Problem: Self-esteem - Low  Goal: Demonstration of positive self-esteem  Flowsheets (Taken 09/30/2021 1056 by Evelina Bucy, RN)  Demonstration of positive self-esteem: Provide positive reinforcement

## 2021-10-02 NOTE — Progress Notes
General Medicine [facility] Progress Note         Name: Kristin Maynard        MRN: [id number]          DOB: 09-11-1995            Age: 26 y.o.  Admission Date: 09/23/2021       LOS: 9 days    Date of Service: 10/02/2021                     Assessment  Principal Problem:    Schizophrenia (HCC)  Active Problems:    Pituitary macroadenoma (HCC)    Hyperprolactinemia (HCC)    Elevated BP without diagnosis of hypertension    Sinus tachycardia    Leukocytosis      26 y.o. female admitted to [facility] with the following medical issues:  1. Schizophrenia, Acute psychosis  2. Moderate hypertriglyceridemia  - Lipid panel 11/17/18: Total cholesterol 216, HDL 48, LDL 120, Triglycerides 238  - Lipid panel 09/29/21: total cholesterol 217, HDL 55, LDL 131, Triglycerides 241  3. Pituitary macroadenoma, Hyperprolactinemia  - Prolactin 303 (11/17/18) -> 479.8 (10/20/19) -> 583 (12/09/19) -> 651.5 (03/30/20) -> 814.7 (09/01/20) -> 925.9 (11/09/20)  - switched from bromocriptine to cabergoline by primary team, first dose on 3/20 planned  4. Sinus tachycardia  - Per chart review HR in 100-130s throughout hospital stay, most commonly 100-120 range  - EKG from 09/23/21 reviewed. Patient has sinus rhythm. HR 91, QTc 420. P waves are upright in leads I, II, aVL and negative in aVR  - blood pressures stable to hypertensive throughout hospital stay thus far  - Metoprolol resumed 09/27/21  - TSH 1.06  - CXR 09/23/21 - no acute cardiopulmonary abnormalities  - urine culture 3/16 with < 100,000 CF urogenital flora. Received 3 days of macrobid  - No prior history of DVT or PE, no clinical signs of DVT  5. Obesity, Metabolic syndrome - BMI 35.04  6. Contraception  - PTA on volnea, receving levonorgestral-ethinyl estradiol inpatient  - urine pregnancy negative 09/23/21  7. Leukocytosis   - intermittent since admission  - admission WBC 13.9 -> 13.1 (3/20)  8. Thromobocytosis  - intermittent since admission  - admission Plt 417   - this appears to be partially likely due to  Hemoconcentration as it resolved inpatient temporarily     Recommendations:  - encourage oral hydration   - EKG attempted today again and unable to complete due to patient agitation  - labs notable for mild thrombocytosis and mild leukocytosis. Patient has no acute signs of infection  - it appears that tachycardia could be reflex from poor oral intake vs agitation/anxiety in setting of AVH vs possible inappropriate sinus tachycardia vs. Medication induced. Patient is hemodynamically stable overall and asymptomatic, given this we will continue to monitor and work this up.   - Patient will likely benefit from cardiac evaluation if tachycardia ongoing as acute psychiatric illness stabilizes.      The patient is admitted to inpatient psychiatric unit.  The inpatient psychiatric team is following.  Internal medicine team was consulted for medical evaluation.  I reviewed patient medical history, vitals, medications, labs and available medical records.     Thanks for the consult, the patient has active medical issues that need monitoring and we will follow with you    General medicine can be reached out by Looking up [facility] on Voalte or Cureatr app for direct  app communication    or by paging [phone]      Dorann Ou, MD  Pager [phone]   _________________________________________________________    Subjective  Kristin Maynard is a 26 y.o. female.  Patient was seen in follow up for tachycardia. She was supervised with nursing staff during history and physical. She reports she has no current palpitations, history of blood clots, no leg swelling or warmth. She denies any shortness of breath. She endorses anxiety. She has no presyncope or syncopal episodes. She feels like she is eating well but her water intake depends on the day.     She again denies any signs of infection such as fevers, chills, rashes, shortness of breath. She denies lightheadedness or dizziness with standing     She reports she has a primary care doctor but hasn't seen him in several years, can't remember name. She does not see a cardiac doctor. She has no known cardiac history. She does not believe she takes any cardiac medications. She has no prior surgeries that she can recall and no known cardiac surgeries.    She gives verbal permission to speak with her father whom she lives with, Kristin Maynard. She agrees that he can provide additional medical history.    Father Kristin Maynard contacted via phone. He says she was just taking Abilify, fluphenazine, birth control, and bromocriptine prior to admission. He doesn't believe she was on metoprolol but he doesn't know if that was started at [facility]. He does know that they started a new medication there but said she didn't need to continue it. He denies any prior known cardiac history. He adds, She is always walking moving around and being active. He denies any history of blood clots. He does note that he was aware of the tachycardia in the ED. He explains, She gets very tense and anxious when then voices start talking to her. All through the emergency room visit she was tense whole the time.    Medications  Scheduled Meds:cabergoline (DOSTINEX) (+) tablet 0.5 mg **PATIENT OWN MED**, 0.5 mg, Oral, Once per day on Mon Thu  levonorgestrel-ethinyl estradiol (AVIANE-28, LESSINA-28) tablet 1 tablet, 1 tablet, Oral, QDAY  lidocaine (LIDODERM) 5 % topical patch 1 patch, 1 patch, Topical, QDAY  metoprolol tartrate tablet 25 mg, 25 mg, Oral, QDAY  traZODone (DESYREL) tablet 50 mg, 50 mg, Oral, QHS    Continuous Infusions:  PRN and Respiratory Meds:acetaminophen Q6H PRN, calcium carbonate TID PRN, hydrOXYzine Q6H PRN, melatonin QHS PRN, OLANZapine Q6H PRN **OR** OLANZapine Q6H PRN, polyethylene glycol 3350 QDAY PRN, senna/docusate QDAY PRN    Review of Systems: negative except as noted above    Objective:                          Vital Signs: Last Filed                 Vital Signs: 24 Hour Range   BP: 147/106 (03/20 0848)  Temp: 36.3 ?C (97.3 ?F) (03/19 2017)  Pulse: 128 (03/20 0848)  Respirations: 18 PER MINUTE (03/19 2017)  SpO2: 97 % (03/19 2017) BP: (140-147)/(83-106)   Temp:  [36.3 ?C (97.3 ?F)]   Pulse:  [122-128]   Respirations:  [18 PER MINUTE]   SpO2:  [97 %]      Vitals:    09/23/21 1034 09/23/21 2120 09/26/21 1421   Weight: 88.5 kg (195 lb) 88.1 kg (194 lb 3.6 oz) 86.9 kg (  191 lb 9.6 oz)       Intake/Output Summary:  (Last 24 hours)  No intake or output data in the 24 hours ending 10/02/21 0853        Physical Exam:  Gen: alert, cooperative, anxious appearing  Head: Normocephalic, without obvious abnormality, atraumatic   Neck: supple, symmetrical, trachea midline,   Lungs: clear to auscultation bilaterally, no wheezes, rales, rhonchi   Heart: HR ~ 115. Regular rhythm, S1, S2 normal, no murmur, click, rub or gallop   Abdomen: soft, non-tender. Bowel sounds normal. No masses, no organomegaly, non-distended   Extremities: no lower extremity edema. No  Calf swelling, warmth or erythema.   Neurologic: fluent speech. Moves all 4 extremities spontaneously.    Lab Review  24-hour labs:    Results for orders placed or performed during the hospital encounter of 09/23/21 (from the past 24 hour(s))   CBC    Collection Time: 10/02/21  6:10 AM   Result Value Ref Range    White Blood Cells 13.1 (H) 4.5 - 11.0 K/UL    RBC 4.62 4.0 - 5.0 M/UL    Hemoglobin 13.3 12.0 - 15.0 GM/DL    Hematocrit 16.1 36 - 45 %    MCV 86.6 80 - 100 FL    MCH 28.8 26 - 34 PG    MCHC 33.3 32.0 - 36.0 G/DL    RDW 09.6 11 - 15 %    Platelet Count 429 (H) 150 - 400 K/UL    MPV 7.0 7 - 11 FL   COMPREHENSIVE METABOLIC PANEL    Collection Time: 10/02/21  6:10 AM   Result Value Ref Range    Sodium 139 137 - 147 MMOL/L    Potassium 4.4 3.5 - 5.1 MMOL/L    Chloride 100 98 - 110 MMOL/L    Glucose 97 70 - 100 MG/DL    Blood Urea Nitrogen 18 7 - 25 MG/DL    Creatinine 0.45 0.4 - 1.00 MG/DL    Calcium 40.9 8.5 - 81.1 MG/DL Total Protein 8.2 (H) 6.0 - 8.0 G/DL    Total Bilirubin 0.3 0.3 - 1.2 MG/DL    Albumin 4.8 3.5 - 5.0 G/DL    Alk Phosphatase 87 25 - 110 U/L    AST (SGOT) 23 7 - 40 U/L    CO2 26 21 - 30 MMOL/L    ALT (SGPT) 40 7 - 56 U/L    Anion Gap 13 (H) 3 - 12    eGFR >60 >60 mL/min   MAGNESIUM    Collection Time: 10/02/21  6:10 AM   Result Value Ref Range    Magnesium 2.1 1.6 - 2.6 mg/dL       Point of Care Testing  (Last 24 hours)  Glucose: 97 (10/02/21 0610)      EKG from 09/23/21 reviewed. Patient has sinus rhythm. HR 91, QTc 420. P waves are upright in leads I, II, aVL and negative in aVR    Dorann Ou, MD  Available on Voalte or Cureatr app under [facility]    pager [phone]

## 2021-10-02 NOTE — Care Coordination-Inpatient
Care Coordination - Inpatient Note      Patient Name:   Kristin Maynard                        MRN:  [id number]  Admission Date:  09/23/2021    2:40-2:45pm  CM spoke with patient's father/DPOA, Corky Downs [phone]), to provide update on patient. CM informed [name] about patient's current mental health status, as well as her remaining on the Red Zone. CM discussed further with [name] as to why the patient remains in the Red Zone, but ensured he was aware that she has been attending groups on the milieu and is slowly improving. [name] verbalized understanding and asked if the patient can have visitors yet. CM asked the Unit Coordinator, who spoke with patient's assigned nurse, and patient can have a visitor. [name] states he will likely visit later this week once he coordinates transportation with his son. CM ensured any additional questions/concerns were addressed prior to enid ng meeting. Will continue to follow-up.     Carmela Rima  10/02/2021

## 2021-10-03 MED ORDER — OLANZAPINE 5 MG PO TBDI
5 mg | Freq: Once | ORAL | 0 refills | Status: CP
Start: 2021-10-03 — End: ?
  Administered 2021-10-03: 08:00:00 5 mg via ORAL

## 2021-10-03 MED ORDER — QUETIAPINE 200 MG PO TAB
200 mg | Freq: Once | ORAL | 0 refills | Status: CP
Start: 2021-10-03 — End: ?
  Administered 2021-10-03: 14:00:00 200 mg via ORAL

## 2021-10-03 MED ORDER — IMS MIXTURE TEMPLATE
300 mg | Freq: Every evening | ORAL | 0 refills | Status: DC
Start: 2021-10-03 — End: 2021-10-04
  Administered 2021-10-04 (×2): 300 mg via ORAL

## 2021-10-03 MED ORDER — LORAZEPAM 1 MG PO TAB
1 mg | Freq: Once | ORAL | 0 refills | Status: CP
Start: 2021-10-03 — End: ?
  Administered 2021-10-03: 14:00:00 1 mg via ORAL

## 2021-10-03 MED ORDER — LORAZEPAM 1 MG PO TAB
1 mg | Freq: Once | ORAL | 0 refills | Status: CP
Start: 2021-10-03 — End: ?
  Administered 2021-10-03: 15:00:00 1 mg via ORAL

## 2021-10-03 MED ORDER — LORAZEPAM 0.5 MG PO TAB
.5 mg | Freq: Two times a day (BID) | ORAL | 0 refills | Status: DC
Start: 2021-10-03 — End: 2021-10-04
  Administered 2021-10-04 (×2): 0.5 mg via ORAL

## 2021-10-03 NOTE — Progress Notes
Name: Kristin Maynard   MRN: [id number]     DOB: [birth year]      Age: 26 y.o.  Admission Date: 09/23/2021     LOS: 10 days     Date of Service: 10/03/2021    MENTAL STATUS EXAM    Mental Status Exam  Legal Status: Voluntary Admission  General Appearance: Avoids eye contact, Worried  Mood / Affect: Flat affect, Labile mood  Speech: Normal  Content Of Thought: Auditory Hallucinations (voices saying "you're all doomed and if they take the baby, you'll die")  Motor Activity: Normal  Flow of Thought: Tangential, Blocking, Racing thoughts  Sensorium: Orientation to person  Insight / Judgment: Poor judgment, Poor insight  Behavior: Cooperative, Pacing, Calm  Patient Strengths: Managing surrounding demands and opportunities      During shift today, pt has had intermittent screaming/yelling at unseen others. Per report, pt has not slept in 2 days. Can be observed responding to internal stimuli and pacing at times. This AM, pt initially given PRN Seroquel '100mg'$  with scheduled meds. Pt cooperative with mental health assessment. Anxiety 10, depression 7. Pt reports SI with no plan, contracts for safety. Pt denies VH but reports AH. When asked about AH she stated "You are all doomed and if I take the baby out, you will all die." Pt continued to fixate talking about a baby,  be internally agitated sliding the chair across the unit, and banging hands on table. Pt needing redirection at times.     One time dose of Seroquel '200mg'$  and Ativan '1mg'$  given per MD Kennith Gain. Pt attempted to sleep for 10 minutes when she awakened screaming and clutching her head. Another dose of Ativan '1mg'$  given per Kennith Gain. Pt napped for a continuous 3.5 hours this afternoon. Pt took shower. 15 min checks. Will continue to monitor.

## 2021-10-03 NOTE — Group Note
Name: Marquerite Forsman   MRN: [id number]     DOB: February 06, 1996      Age: 26 y.o.  Admission Date: 09/23/2021     LOS: 10 days     Date of Service: 10/03/2021      Group Topic: [place] Goal Setting  Group Date: 10/03/2021  Start Time: 0915  End Time: 1000  Facilitators: Harrell Lark          Number of Participants: 12  Group Focus: communication, concentration, coping skills, feeling awareness/expression, goals/reality orientation, leisure skills, music therapy, problem solving, relaxation, reminiscence, and social skills  Treatment Modality: Music Therapy  Interventions utilized were other Strength Shields  Purpose: enhance coping skills, express feelings, improve communication skills, increase insight, regain self-worth, and reinforce self-care    Shield Strength Recognition: The music therapist gave patients a shield worksheet and markers. Within the shield, patients were instructed to list their personal goals, strengths, support system, and coping skills as well as motivating songs. While patients completed their shields, the therapist played music focused on strength and empowerment. Patients then shared their shields with the group. The purpose of this intervention is to enhance self-confidence, self-worth, coping skills, goal setting, and communication/expression of emotions.      Name: Ashiah Date of Birth: [birth year]   MR: [id number]      Level of Participation: patient not present for group   Plan: to continue treatment

## 2021-10-03 NOTE — Progress Notes
PRN Medication Administered:  Seroquel '200mg'$ , Ativan '1mg'$   Non pharmacological interventions attempted prior to PRN medication administration:  Decrease stimuli    Brief narrative of reason for PRN medication administration: Pt increasingly getting agitated and yelling - 1x order for Seroquel '200mg'$  and Ativan '1mg'$  given PO.

## 2021-10-03 NOTE — Progress Notes
Encouraged Pt to do a  EKG . Explained that provider had ordered one for her to do.This nurse offered to assist pt in obtaining one.Pt though declined stating that she did not want one done at this time.

## 2021-10-03 NOTE — Care Plan
Problem: Mood - Altered  Goal: Stabilize mood  10/02/2021 2252 by Eldridge Dace, RN  Outcome: Goal Ongoing  10/02/2021 2251 by Eldridge Dace, RN  Outcome: Goal Ongoing  Flowsheets (Taken 10/02/2021 2251)  Stabilize mood:   Assess ineffective coping signs and symptoms   Assess depressive symptoms   Assess depression history   Assess coping style   Establish therapeutic relationships  Goal: Knowledge of Altered Mood  Outcome: Goal Ongoing  Flowsheets (Taken 10/02/2021 2252)  Knowledge of altered mood:   Provide psychosocial intervention education   Provide relaxation techniques education   Provide self-help education     Problem: Mood - Altered  Goal: Knowledge of Altered Mood  Outcome: Goal Ongoing  Flowsheets (Taken 10/02/2021 2252)  Knowledge of altered mood:   Provide psychosocial intervention education   Provide relaxation techniques education   Provide self-help education     Problem: Thought Process - Altered  Goal: Demonstration of organized thought processes  Outcome: Goal Ongoing  Flowsheets (Taken 10/02/2021 2252)  Demonstration of organized thought processes:   Assess cognitive ability   Provide environmental regulation - agitation   Assess disturbed thought process signs and symptoms   Assess in reality orientation   Assess risk for impaired cognition   Assess thought process

## 2021-10-03 NOTE — Progress Notes
Psychiatry Progress Note  Name: Kristin Maynard          MRN: [id number] DOB: [birth year]         Age: 26 y.o.  Admission Date: 09/23/2021  LOS: 10 days    Service: [facility]    Principal Problem:    Schizophrenia (HCC)  Active Problems:    Pituitary macroadenoma (HCC)    Hyperprolactinemia (HCC)    Elevated BP without diagnosis of hypertension    Sinus tachycardia    Leukocytosis    PLAN  1. Continue admission to red zone for disorganization and behaviors that are minimally redirectable and not appropriate for the general milieu at this time   2. Continue Cabergoline 2.5mg  PO twice weekly (started on 3/20)  3. Abilify Maintena 400mg  last administered on 2/24  4. Initiate Ativan 0.5mg  BID for agitation and anxiety associated with psychosis  5. Initiate Seroquel 300mg  qhs with plans to titrate up as tolerated   6. Continue Trazodone 100mg  qhs  7. Discussed with IM a possible endocrinology consult per chart review and appreciate their assistance   8. EKG deferred as patient is unwilling to complete at this time        REASONS FOR CONTINUED HOSPITALIZATION  Psychiatric stabilization and medication management. Patient with decreased social support and at risk for recidivism if discharged at this time.    ______________________________________________________________  Kristin Maynard was seen today in the red zone. Initially she was seen this morning and was screaming, responding to stimuli and Ativan 1mg  and Seroquel 200mg  were added as NOW doses. She fell asleep about an hour after it was given and woke up approximately 15 minutes later screaming. An additional 1mg  of Ativan was ordered. She was sitting in bed in a cross legged position and staring at the wall/window with a startled affect. She appeared paranoid. When I asked her how she was doing she stated alright other than I almost died just now. She asked for water and when a glass of water was offered to her she continued to stare straight ahead. She continues to respond to stimuli and appear paranoid, delusional about her health and grossly disorganized.     Kristin Maynard took PRN seroquel 3 x's yesterday and required an additional dose of zyprexa overnight without much benefit noted. She did not sleep at all last night due to the voices.     REVIEW OF SYSTEMS  Review of Systems   Constitutional: Negative for chills and fever.   Respiratory: Negative for shortness of breath.    Cardiovascular: Negative for chest pain.   Gastrointestinal: Negative for nausea and vomiting.   Skin: Negative for rash.   Neurological: Negative for dizziness, light-headedness and headaches.   Psychiatric/Behavioral: Positive for hallucinations and sleep disturbance. Negative for suicidal ideas.     ______________________________________________________________  OBJECTIVE                 Vital Signs:  Current                Vital Signs: 24 Hour Range   BP: 146/101 (03/21 0800)  Temp: 36.9 ?C (98.4 ?F) (03/21 0800)  Pulse: 119 (03/21 0800)  Respirations: 18 PER MINUTE (03/21 0800)  SpO2: 97 % (03/21 0800) BP: (130-146)/(101-102)   Temp:  [36.4 ?C (97.6 ?F)-36.9 ?C (98.4 ?F)]   Pulse:  [119-128]   Respirations:  [18 PER MINUTE]   SpO2:  [94 %-97 %]    Intensity Pain Scale (Self Report): (not  recorded)      Scheduled Medications:  cabergoline (DOSTINEX) (+) tablet 0.5 mg **PATIENT OWN MED**, 0.5 mg, Oral, Once per day on Mon Thu  levonorgestrel-ethinyl estradiol (AVIANE-28, LESSINA-28) tablet 1 tablet, 1 tablet, Oral, QDAY  lidocaine (LIDODERM) 5 % topical patch 1 patch, 1 patch, Topical, QDAY  LORazepam (ATIVAN) tablet 0.5 mg, 0.5 mg, Oral, BID  melatonin tablet 5 mg, 5 mg, Oral, QHS  metoprolol tartrate tablet 25 mg, 25 mg, Oral, QDAY  QUEtiapine (SEROquel) tablet 300 mg, 300 mg, Oral, QHS  traZODone (DESYREL) tablet 100 mg, 100 mg, Oral, QHS        PRN Medications:  acetaminophen Q6H PRN 650 mg at 10/03/21 0812, calcium carbonate TID PRN, hydrOXYzine Q6H PRN 25 mg at 10/02/21 2356, [DISCONTINUED] OLANZapine Q6H PRN 5 mg at 10/02/21 0348 **OR** OLANZapine Q6H PRN, polyethylene glycol 3350 QDAY PRN, QUEtiapine Q6H PRN 100 mg at 10/03/21 0812, senna/docusate QDAY PRN    MENTAL STATUS EXAMINATION  General/Constitutional: appears stated age, dressed appropriately, disheveled  Eye Contact: intense gaze straight forward at wall/window   Behavior: paranoid, disorganized   Speech: Delayed response, regular rate, monotone   Mood: I almost died  Affect: flat  Thought Process: disorganized   Thought Content: No overt SI/HI verbalized. Delusional regarding dying   Perception: Appears to be responding to internal stimuli   Associations: Intact  Insight/Judgment: impaired/impaired     Physical Exam:  Gait: Ambulates without difficulty. Normal arm swing   ______________________________________________________________  [name], DO

## 2021-10-03 NOTE — Progress Notes
PRN Medication Administered:  Seroquel 100 mg  Non pharmacological interventions attempted prior to PRN medication administration:  Decrease  stimuli/verbal de-escalation    Brief narrative of reason for PRN medication administration: Pt seen in room speaking  To unseen others   increased tone.Pt began yelling." I don't want my family to die." offered pt prn Pt agreed to take.

## 2021-10-03 NOTE — Group Note
Name: Shanavia Makela   MRN: [id number]     DOB: 1995/12/13      Age: 26 y.o.  Admission Date: 09/23/2021     LOS: 10 days     Date of Service: 10/03/2021      Group Topic: BH Coping Skills  Group Date: 10/02/2021  Start Time: 1245  End Time: 1400  Facilitators: Candie Chroman          Number of Participants: 12  Group Focus: coping skills  Treatment Modality: Art Therapy  Interventions utilized were assignment and exploration  Purpose: enhance coping skills, express feelings, and reinforce self-care    Group members experimented with watercolor techniques and surface design while painting clay containers. Activity was designed explore identity and promote problem solving, coping skills, and affect regulation.            Name: Adda Date of Birth: 12/17/1995   MR: [id number]      Level of Participation: patient not present for group   Plan: to continue treatment

## 2021-10-03 NOTE — Progress Notes
PRN Medication Administered:  Ativan '1mg'$  ONCE  Non pharmacological interventions attempted prior to PRN medication administration:  Verbal de-escalation techniques, Decrease stimuli and Increase structure and limit setting    Brief narrative of reason for PRN medication administration: Pt appeared to be sleeping and out of the blue awakened screaming very loud. Pt appeared agitated and distraught. 1:1 support provided.

## 2021-10-03 NOTE — Care Coordination-Inpatient
Care Coordination - Inpatient Note      Patient Name:   Kristin Maynard                        MRN:  [id number]  Admission Date:  09/23/2021    12:32pm  CM spoke with patient's father, Kristin Maynard. [name] states he plans on visiting the patient on Wednesday. CM informed [name] that he will likely meet with patient in a consult room off the unit. [name] verbalized understanding. CM to inform treatment team and will follow-up as needed.    Carmela Rima  10/03/2021

## 2021-10-03 NOTE — Group Note
Name: Rhyli Depaula   MRN: [id number]     DOB: 07/15/1996      Age: 26 y.o.  Admission Date: 09/23/2021     LOS: 10 days     Date of Service: 10/03/2021      Group Topic: BH Positive Problem Solving  Group Date: 10/03/2021  Start Time: 0272  End Time: 1100  Facilitators: Candie Chroman          Number of Participants: 12  Group Focus: leisure skills  Treatment Modality: Art Therapy  Interventions utilized were assignment and exploration  Purpose: enhance coping skills, express feelings, and reinforce self-care     Group members were provided with a variety of papers, glue sticks, and oil pastels and encouraged to create an abstract collage about something they enjoy. Activity is intended to promote creative expression, problem solving, coping skills, and healthy leisure interests.         Name: Debbie Date of Birth: 1996/04/02   MR: [id number]      Level of Participation: patient not present for group   Plan: to continue treatment

## 2021-10-03 NOTE — Behavioral Health Treatment Team
TREATMENT TEAM NOTE  Name: Tyliah Schlereth          MRN: [id number]              DOB: [birth year]          Age: 26 y.o.  Admission Date: 09/23/2021                 Attendees:   Psychiatrist: Arnette Norris, DO  Psychiatrist: Alfredia Ferguson, APRN  Nurse Supervisor: Cyndia Skeeters, RN, BSN  Nurse: Dede Query, RN  Pharmacist: Lurlean Nanny, PharmD  Therapist: Dewayne Hatch, [place], [facility]  Case Manager: Carmela Rima, LMSW  Case Manager: Tinnie Gens, LMSW  Case Manager: April Cordero, [place]  Utilization Review: Areatha Keas, RN        Significant Points Discussed: ongoing hallucinations and delusions; responding to internal stimuli; Seroquel and Zyprexa not effective; 0 hours sleep      Treatment Plan Discussion: will increase Trazodone to help with sleep       Projected Discharge Date: mid-to-late next week

## 2021-10-03 NOTE — Group Note
Name: Kristin Maynard   MRN: [id number]     DOB: Nov 28, 1995      Age: 26 y.o.  Admission Date: 09/23/2021     LOS: 10 days     Date of Service: 10/03/2021      Group Topic: [place] Leisure Activities  Group Date: 10/03/2021  Start Time: 7858  End Time: 1320  Facilitators: Earl Lites          Number of Participants: 13  Group Focus: leisure skills, self-esteem, and social skills  Treatment Modality: Recreation Therapy  Interventions utilized were group exercise and leisure development  Purpose: enhance coping skills, improve communication skills, regain self-worth, and reinforce self-care    Patients exercised to music and then shared their first name and something that was going well for them today. Patients then divided into groups and learned a couple of card games they can later play in their leisure time.      Name: Kristin Maynard Date of Birth: 07-30-1995   MR: [id number]      Level of Participation: patient not present for group   Plan: to continue treatment

## 2021-10-03 NOTE — Progress Notes
I have reviewed the notes, assessments, and/or procedures performed by Amber, RN and concur with her documentation unless otherwise noted.

## 2021-10-03 NOTE — Progress Notes
PRN Medication Administered:  Seroquel '100mg'$   Non pharmacological interventions attempted prior to PRN medication administration:  Decrease stimuli    Brief narrative of reason for PRN medication administration: Pt becoming agitated and responding to voices

## 2021-10-03 NOTE — Care Plan
Problem: Harm to self, high risk of suicide  Goal: Absence of Harm to Self  Outcome: Goal Ongoing     Problem: Mood - Altered  Goal: Stabilize mood  Outcome: Goal Ongoing     Problem: Self-esteem - Low  Goal: Demonstration of positive self-esteem  Outcome: Goal Ongoing     Problem: Thought Process - Altered  Goal: Demonstration of organized thought processes  Outcome: Goal Ongoing

## 2021-10-04 MED ORDER — QUETIAPINE 200 MG PO TAB
400 mg | Freq: Every evening | ORAL | 0 refills | Status: DC
Start: 2021-10-04 — End: 2021-10-05
  Administered 2021-10-05: 02:00:00 400 mg via ORAL

## 2021-10-04 MED ORDER — LORAZEPAM 0.5 MG PO TAB
.5 mg | Freq: Every day | ORAL | 0 refills | Status: DC
Start: 2021-10-04 — End: 2021-10-09
  Administered 2021-10-05 – 2021-10-09 (×5): 0.5 mg via ORAL

## 2021-10-04 MED ORDER — LORAZEPAM 1 MG PO TAB
1 mg | Freq: Once | ORAL | 0 refills | Status: CP
Start: 2021-10-04 — End: ?
  Administered 2021-10-04: 15:00:00 1 mg via ORAL

## 2021-10-04 MED ORDER — LORAZEPAM 2 MG PO TAB
2 mg | Freq: Every evening | ORAL | 0 refills | Status: DC
Start: 2021-10-04 — End: 2021-10-09
  Administered 2021-10-05 – 2021-10-09 (×5): 2 mg via ORAL

## 2021-10-04 MED ORDER — QUETIAPINE 200 MG PO TAB
200 mg | Freq: Every day | ORAL | 0 refills | Status: CP
Start: 2021-10-04 — End: ?
  Administered 2021-10-04: 14:00:00 200 mg via ORAL

## 2021-10-04 NOTE — Group Note
Name: Kristin Maynard   MRN: [id number]     DOB: 06/10/1996      Age: 26 y.o.  Admission Date: 09/23/2021     LOS: 11 days     Date of Service: 10/04/2021      Group Topic: BH Relaxation and Self-Soothing  Group Date: 10/04/2021  Start Time: 1300  End Time: [place]  Facilitators: Candie Chroman          Number of Participants: 12  Group Focus: coping skills  Treatment Modality: Art Therapy  Interventions utilized were exploration  Purpose: enhance coping skills, express feelings, and reinforce self-care    Group members were provided with a variety of drawing supplies and stencils and encouraged to free draw for self-expression and relaxation. Drawing aids were provided for structure and to help alleviate potential anxiety related to open-ended directive. Participants were instructed to focus on the creative process and to try not to judge their work.            Name: Kristin Maynard Date of Birth: 1996-07-05   MR: [id number]      Level of Participation: patient not present for group   Plan: to continue treatment

## 2021-10-04 NOTE — Group Note
Name: Kristin Maynard   MRN: [id number]     DOB: 06/05/1996      Age: 26 y.o.  Admission Date: 09/23/2021     LOS: 11 days     Date of Service: 10/04/2021      Group Topic: BH Personal Values  Group Date: 10/04/2021  Start Time: 1015  End Time: 1100  Facilitators: Harrell Lark          Number of Participants: 11  Group Focus: communication, coping skills, feeling awareness/expression, goals/reality orientation, leisure skills, music therapy, other positive thinking, problem solving, reminiscence, self-awareness, self-esteem, and social skills  Treatment Modality: Music Therapy  Interventions utilized were other On Top of the [employer]; Positive Experiences Prompts.  Purpose: enhance coping skills, express feelings, improve communication skills, increase insight, reinforce self-care, and enhance positive thinking    Lyric Analysis On Top of the World: The therapist led a group lyric analysis of On Top of the World by [employer]. After listening to the song once, the group discussed anything that stood out to them. The patients were provided lyrics and markers; the group listened to the song again writing down and marking lyrics as they listened. The group then discussed the song again and reflections on looking at positive experiences in their lives. The group was then provided with a handout about positive experiences.  Positive Experiences: The therapist provided patients with a Positive Experiences (aid via therapistaid.com), worksheet. Patients were instructed to complete the experiences to the best of their abilities, and then on the back of the aid patients wrote positive songs. Patients then shared these songs with the group, and discussed how music impacts mood.    Name: Beva Date of Birth: 11/18/1995   MR: [id number]      Level of Participation: patient not present for group   Plan: to continue treatment

## 2021-10-04 NOTE — Progress Notes
Case Management Progress Note    NAME:Kristin Maynard MRN: [id number] DOB:March 15, 1996 AGE: 26 y.o.  ADMISSION DATE: 09/23/2021 DAYS ADMITTED: LOS: 11 days     Date of Service: 10/04/2021  Service Start Time:  10:46am  Service End Time:  10:48am    CM met with patient to check-in and discuss case management needs. Patient states the voices and visions are "still frustrating" but she is coping by thinking about her friend. CM informed patient that her dad and brother will be here this evening for visitation. Patient verbalized understanding. Patient still agreeable to engaging in psychiatry services at the [facility] upon discharge and is also interested in therapy. Patient denies wanting outpatient case management services. CM ensured any additional questions/concerns were addressed prior to ending meeting. Will continue to follow-up.

## 2021-10-04 NOTE — Progress Notes
PRN Medication Administered:  Ativan '1mg'$   Non pharmacological interventions attempted prior to PRN medication administration:  Decrease stimuli    Brief narrative of reason for PRN medication administration: Pt yelling and becoming increasingly agitated. 1x order of Ativan '1mg'$  given PO

## 2021-10-04 NOTE — Group Note
Name: Kristin Maynard   MRN: [id number]     DOB: 08-13-1995      Age: 26 y.o.  Admission Date: 09/23/2021     LOS: 11 days     Date of Service: 10/04/2021      Group Topic: [place] Leisure Activities  Group Date: 10/04/2021  Start Time: 0915  End Time: 1000  Facilitators: Harrell Lark          Number of Participants: 6  Group Focus: communication, concentration, coping skills, feeling awareness/expression, healthy friendships, leisure skills, problem solving, relaxation, reminiscence, and social skills  Treatment Modality: Music Therapy  Interventions utilized were other Musical Guess Who  Purpose: enhance coping skills, express feelings, improve communication skills, increase insight, and reinforce self-care    Musical Guess Who - The MT supplied the patients will pens and paper. On it, the MT had the patients write down a strength, types of music they enjoy and one song that they feel describes them. The MT then put these pieces of paper into the cup and drew one at random. The MT then read the card, played the song and afterwards invited the clients to guess who they thought selected the song. The MT then facilitated a discussion about making connections with others and why each person selected the song they did. The purpose of this intervention was to enhance social skills, self-care, leisure development, and make connections.    Name: Kristin Maynard Date of Birth: May 02, 1996   MR: [id number]      Level of Participation: patient not present for group   Plan: to continue treatment

## 2021-10-04 NOTE — Care Coordination-Inpatient
Care Coordination - Inpatient Note      Patient Name:   Kristin Maynard                        MRN:  [id number]  Admission Date:  09/23/2021    10:59-11am  CM spoke with patient's father/DPOA,Kristin Maynard [phone]), to discuss his visit with patient this evening. Kristin Maynard reports the patient requested he bring [facility]. CM informed Kristin Maynard that hospital policy doesn't allow outside food/drink. Kristin Maynard verbalized understanding and reports no additional questions/concerns at this time. Will continue to follow-up.     Carmela Rima  10/04/2021

## 2021-10-04 NOTE — Group Note
Name: Kristin Maynard   MRN: [id number]     DOB: [birth year]      Age: 26 y.o.  Admission Date: 09/23/2021     LOS: 11 days     Date of Service: 10/04/2021      Group Topic: [place] Leisure Activities  Group Date: 10/04/2021  Start Time: 7185  End Time: 1305  Facilitators: Earl Lites          Number of Participants: 9  Group Focus: coping skills, leisure skills, self-esteem, and social skills  Treatment Modality: Recreation Therapy  Interventions utilized were leisure development  Purpose: enhance coping skills, improve communication skills, regain self-worth, and reinforce self-care    Patients enjoyed petting a therapy dog and sharing stories about their pets during Pet Therapy.      Name: Kristin Maynard Date of Birth: October 20, 1995   MR: [id number]      Level of Participation: active   Quality of Participation: attentive, cooperative and drowsy  Interactions with others: gave feedback  Mood/Affect: appropriate and flat  Cognition: goal directed  Progress: Moderate  Response: Patient enjoyed petting the dog.   Plan: to continue treatment

## 2021-10-04 NOTE — Progress Notes
PRN Medication Administered:  Seroquel 100 mg    Non pharmacological interventions attempted prior to PRN medication administration:  Verbal de-escalation techniques and Decrease stimuli    Brief narrative of reason for PRN medication administration:   Kristin Maynard has been restless all evening, unable to lay down for more than a few minutes at a time. She is responding to internal stimuli, yelling at unseen others. She endorses AVH of  "monsters."

## 2021-10-04 NOTE — Care Plan
Problem: Harm to self, high risk of suicide  Goal: Absence of Harm to Self  Outcome: Goal Ongoing     Problem: Elopement Prevention  Goal: Patients without Decisional Capacity will not elope from the care setting  Outcome: Goal Ongoing     Problem: Mood - Altered  Goal: Stabilize mood  Outcome: Goal Ongoing

## 2021-10-04 NOTE — Behavioral Health Treatment Team
TREATMENT TEAM NOTE  Name: Kristin Maynard          MRN: [id number]              DOB: Feb 13, 1996          Age: 26 y.o.  Admission Date: 09/23/2021               Attendees:   Psychiatrist: Arnette Norris, DO  Nurse Supervisor: Cyndia Skeeters, RN, BSN  Nurse: Lucius Conn, RN  Pharmacist: Lurlean Nanny, PharmD  Therapist: Dewayne Hatch, [place], [place]  Therapist: Neita Carp, [place]  Case Manager: Carmela Rima, LMSW  Case Manager: Tinnie Gens, LMSW  Case Manager: April Cordero, [place]  Utilization Review: Areatha Keas, RN    Significant Points Discussed: Anxiety 10/10. Depression 7/10. Si no plan. AH of 'babies'. Slept 5 hours in daytime. <3 hours overnight. Minimal ability to redirect.      Treatment Plan Discussion: Continue treatment. Titrate Seroquel.      Projected Discharge Date: TBD

## 2021-10-04 NOTE — Care Plan
Problem: Harm to self, high risk of suicide  Goal: Absence of Harm to Self  Outcome: Goal Ongoing     Problem: High Fall Risk  Goal: High Fall Risk  Outcome: Goal Ongoing     Problem: Self-esteem - Low  Goal: Demonstration of positive self-esteem  Outcome: Goal Ongoing     Problem: Thought Process - Altered  Goal: Demonstration of organized thought processes  Outcome: Goal Ongoing     Problem: Violence, self/other-directed, Risk of  Goal: Absence of violence  Outcome: Goal Ongoing

## 2021-10-04 NOTE — Progress Notes
I have reviewed the notes, assessments, and/or procedures performed by Amber, RN and concur with her documentation unless otherwise noted.

## 2021-10-05 MED ORDER — ZOLPIDEM 5 MG PO TAB
5 mg | Freq: Every evening | ORAL | 0 refills | Status: DC | PRN
Start: 2021-10-05 — End: 2021-10-09
  Administered 2021-10-06 – 2021-10-09 (×4): 5 mg via ORAL

## 2021-10-05 MED ORDER — QUETIAPINE 200 MG PO TAB
600 mg | Freq: Every evening | ORAL | 0 refills | Status: DC
Start: 2021-10-05 — End: 2021-10-09
  Administered 2021-10-06 – 2021-10-09 (×4): 600 mg via ORAL

## 2021-10-05 NOTE — Progress Notes
PRN Medication Administered:  Seroquel 100 mg    Non pharmacological interventions attempted prior to PRN medication administration:  Verbal de-escalation techniques, Decrease stimuli and Increase structure and limit setting, redirection, reality orientation    Brief narrative of reason for PRN medication administration:  Kristin Maynard has been unable to sleep tonight, intermittently screaming, consistently responding to internal stimuli, pacing, and making violent gestures in response to unseen others (stabbing, spinning around and grabbing the air, kicking). She is redirectable for a brief period and then goes back to talking and gesturing to unseen others.

## 2021-10-05 NOTE — Care Plan
Problem: Mood - Altered  Goal: Stabilize mood  Outcome: Goal Ongoing  Goal: Knowledge of Altered Mood  Outcome: Goal Ongoing     Problem: Elopement Prevention  Goal: Patients without Decisional Capacity will not elope from the care setting  Outcome: Goal Ongoing     Problem: Self-esteem - Low  Goal: Demonstration of positive self-esteem  Outcome: Goal Ongoing     Problem: Thought Process - Altered  Goal: Demonstration of organized thought processes  Outcome: Goal Ongoing

## 2021-10-05 NOTE — Progress Notes
Pt refused AM labs

## 2021-10-05 NOTE — Progress Notes
Psychiatry Progress Note  Name: Kristin Maynard          MRN: [id number] DOB: [birth year]         Age: 26 y.o.  Admission Date: 09/23/2021  LOS: 12 days    Service: [facility] Adult Psych A    Principal Problem:    Schizophrenia (HCC)  Active Problems:    Pituitary macroadenoma (HCC)    Hyperprolactinemia (HCC)    Elevated BP without diagnosis of hypertension    Sinus tachycardia    Leukocytosis    PLAN  1. Continue admission to red zone for disorganization and behaviors that are minimally redirectable and not appropriate for the general milieu at this time   2. Continue Cabergoline 2.5mg  PO twice weekly (started on 3/20)  3. Abilify Maintena 400mg  last administered on 2/24  4. Continue Ativan 0.5mg  qam and 2mg  qhs for agitation and anxiety associated with psychosis (increased on 3/22)  5. Increase Seroquel to 600mg  qhs with plans to titrate up as tolerated   6. Initiate Ambien 5mg  qhs for insomnia   7. Continue Trazodone 100mg  qhs  8. Will refer to [place] [facility] in outpatient setting once patient discharges   9. EKG deferred as patient is unwilling to complete at this time   10. CBC, CMP and prolactin labs ordered for 3/24  11. Continue to monitor BP and pulse which have improved with the addition of Ativan and were likely induced by anxiety and bothersome hallucinations        REASONS FOR CONTINUED HOSPITALIZATION  Psychiatric stabilization and medication management. Patient is high risk for re-admission if discharged at this time     ______________________________________________________________  SUBJECTIVE  Kristin Maynard was seen today in the red zone after she had completed hygiene. She stated she was fine today. She noted appetite was good and sleep was bad. She reported VH of seeing Robin. She endorsed AH. Her attention was slightly improved today but she continues to display agitation when responding to stimuli.     She required the following PRN medications in the past 24 hrs: hydroxyzine x1, Ativan 2mg , Seroquel 100mg  x3, Seroquel 200mg  x1. She has been observed responding to stimuli by casting spells and discussing potions and will also kick at [facility]. She did attend a group this morning and was able to verbalize to CO when she was ready to go back to the red zone. She did not sleep at all last night.     REVIEW OF SYSTEMS  Review of Systems   Constitutional: Negative for chills and fever.   Respiratory: Negative for shortness of breath.    Cardiovascular: Negative for chest pain.   Gastrointestinal: Negative for nausea and vomiting.   Skin: Negative for rash.   Neurological: Negative for dizziness, light-headedness and headaches.   Psychiatric/Behavioral: Positive for hallucinations and sleep disturbance. Negative for suicidal ideas.     ______________________________________________________________  OBJECTIVE                 Vital Signs:  Current                Vital Signs: 24 Hour Range   BP: 138/108 (03/22 1955)  Temp: 36.2 ?C (97.2 ?F) (03/22 1955)  Pulse: 73 (03/22 1955)  Respirations: 17 PER MINUTE (03/22 1955)  SpO2: 99 % (03/22 1955) BP: (138)/(108)   Temp:  [36.2 ?C (97.2 ?F)]   Pulse:  [73]   Respirations:  [17 PER MINUTE]   SpO2:  [99 %]    Intensity  Pain Scale (Self Report): (not recorded)      Scheduled Medications:  cabergoline (DOSTINEX) (+) tablet 0.5 mg **PATIENT OWN MED**, 0.5 mg, Oral, Once per day on Mon Thu  levonorgestrel-ethinyl estradiol (AVIANE-28, LESSINA-28) tablet 1 tablet, 1 tablet, Oral, QDAY  lidocaine (LIDODERM) 5 % topical patch 1 patch, 1 patch, Topical, QDAY  LORazepam (ATIVAN) tablet 0.5 mg, 0.5 mg, Oral, QDAY  LORazepam (ATIVAN) tablet 2 mg, 2 mg, Oral, QHS  melatonin tablet 5 mg, 5 mg, Oral, QHS  metoprolol tartrate tablet 25 mg, 25 mg, Oral, QDAY  QUEtiapine (SEROquel) tablet 600 mg, 600 mg, Oral, QHS  traZODone (DESYREL) tablet 100 mg, 100 mg, Oral, QHS        PRN Medications:  acetaminophen Q6H PRN 650 mg at 10/03/21 0812, calcium carbonate TID PRN, hydrOXYzine Q6H PRN 25 mg at 10/04/21 2235, [DISCONTINUED] OLANZapine Q6H PRN 5 mg at 10/02/21 0348 **OR** OLANZapine Q6H PRN, polyethylene glycol 3350 QDAY PRN, QUEtiapine Q6H PRN 100 mg at 10/05/21 0454, senna/docusate QDAY PRN, zolpidem QHS PRN    MENTAL STATUS EXAMINATION  General/Constitutional: appears stated age, dressed appropriately, good hygiene with improved concentration   Eye Contact: intense gaze    Behavior: paranoid, disorganized   Speech: regular rate and rhythm, appropriate volume   Mood: okay  Affect: flat  Thought Process: disorganized   Thought Content: Denies SI stating not right now. Denies HI. Delusional regarding multiple things   Perception: Appears to be responding to internal stimuli. Endorses AH and VH   Associations: loosened   Insight/Judgment: impaired/impaired     Physical Exam:  Gait: Ambulates without difficulty. Normal arm swing  Neuro: no tremors or dystonia noted    ______________________________________________________________  [name], DO

## 2021-10-05 NOTE — Progress Notes
PRN Medication Administered:  Atarax 25 mg    Non pharmacological interventions attempted prior to PRN medication administration:  Verbal de-escalation techniques and Decrease stimuli     Brief narrative of reason for PRN medication administration:   Pt endorsed anxiety 12/10, appears restless, pacing the halls, worried affect, intermittent crying.

## 2021-10-05 NOTE — Care Plan
Problem: Harm to self, high risk of suicide  Goal: Absence of Harm to Self  Outcome: Goal Ongoing  Flowsheets (Taken 10/04/2021 2112)  Absence of harm to self:   Assess and report significant changes in mood and affect   Maintain a safe environment   Reassess for suicide risk daily     Problem: Elopement Prevention  Goal: Patients without Decisional Capacity will not elope from the care setting  Outcome: Goal Ongoing     Problem: Mood - Altered  Goal: Stabilize mood  Outcome: Goal Ongoing  Flowsheets (Taken 10/04/2021 2112)  Stabilize mood:   Assess coping style   Assess ineffective coping signs and symptoms   Assess depressive symptoms   Establish therapeutic relationships

## 2021-10-05 NOTE — Group Note
Name: Kristin Maynard   MRN: [id number]     DOB: [birth year]      Age: 26 y.o.  Admission Date: 09/23/2021     LOS: 12 days     Date of Service: 10/05/2021      Group Topic: [place] Leisure Activities  Group Date: 10/05/2021  Start Time: 1282  End Time: 1450  Facilitators: Earl Lites          Number of Participants: 11  Group Focus: coping skills, healthy friendships, leisure skills, self-awareness, self-esteem, and social skills  Treatment Modality: Leisure Development  Interventions utilized were group exercise, leisure development, and mental fitness  Purpose: enhance coping skills, regain self-worth, and reinforce self-care    Patients played Sequence a team game.       Name: Kristin Maynard Date of Birth: [birth year]   MR: [id number]      Level of Participation: patient not present for group   Plan: to continue treatment

## 2021-10-05 NOTE — Group Note
Name: Kristin Maynard   MRN: [id number]     DOB: 1996/05/08      Age: 26 y.o.  Admission Date: 09/23/2021     LOS: 12 days     Date of Service: 10/05/2021      Group Topic: BH Leisure Activities  Group Date: 10/05/2021  Start Time: 0915  End Time: 1015  Facilitators: Earl Lites          Number of Participants: 12  Group Focus: coping skills, leisure skills, relaxation, self-awareness, and self-esteem  Treatment Modality: Recreation Therapy  Interventions utilized were leisure development  Purpose: enhance coping skills, increase insight, regain self-worth, and reinforce self-care      Patients participated in chair, cardio and walking exercise. Patients shared their first name and what they do when they would like to relax.  Patients then participated in meditation.    Name: Kristin Maynard Date of Birth: 31-Mar-1996   MR: [id number]      Level of Participation: active while in group.   Quality of Participation: attentive and cooperative  Interactions with others: did not interact  Mood/Affect: appropriate  Cognition: goal directed  Progress: Moderate  Response: Patient attended the exercise part of group and then left the group.  Plan: to continue treatment

## 2021-10-05 NOTE — Behavioral Health Treatment Team
TREATMENT TEAM NOTE  Name: Kristin Maynard          MRN: [id number]              DOB: [birth year]          Age: 26 y.o.  Admission Date: 09/23/2021                 Attendees:   Psychiatrist: Arnette Norris, DO  Psychiatrist: Alfredia Ferguson, APRN  Nurse Supervisor: Cyndia Skeeters, RN, BSN  Nurse: Dede Query, RN  Pharmacist: Lurlean Nanny, PharmD  Therapist: Dewayne Hatch, [place], [place]  Therapist: Neita Carp, [place]  Case Manager: Carmela Rima, LMSW  Case Manager: Tinnie Gens, LMSW  Case Manager: April Cordero, [place]  Utilization Review: Areatha Keas, RN        Significant Points Discussed: screaming at unseen others intermittently but less than yesterday; ongoing delusional and hallucinations; anxiety and depression=10; denies SI/HI; wants to go to groups; slept 0 hours       Treatment Plan Discussion: will discuss with dad possibly starting Clozaril      Projected Discharge Date: TBD

## 2021-10-05 NOTE — Group Note
Name: Kristin Maynard   MRN: [id number]     DOB: Jul 15, 1996      Age: 26 y.o.  Admission Date: 09/23/2021     LOS: 12 days     Date of Service: 10/05/2021      Group Topic: BH Positive Problem Solving  Group Date: 10/05/2021  Start Time: 1245  End Time: 1345  Facilitators: Candie Chroman          Number of Participants: 9  Group Focus: feeling awareness/expression and problem solving  Treatment Modality: Art Therapy  Interventions utilized were assignment and exploration  Purpose: enhance coping skills and express feelings    Patients were provided with a small piece of a landscape image and encouraged to use it as a starting point for their own painting. Activity is intended to provide structure while promoting imagination, self-expression  and creative problem solving.           Name: Kristin Maynard Date of Birth: 03/01/1996   MR: [id number]      Level of Participation: active   Quality of Participation: attentive, cooperative and engaged  Interactions with others: when spoken to  Mood/Affect: flat  Triggers (if applicable): NA  Cognition: coherent/clear  Progress: Moderate  Response: Fully participated for first half of group and then requested to return to her room  Plan: to continue treatment

## 2021-10-06 NOTE — Progress Notes
PRN Medication Administered:  Seroquel 100 mg po.  Non pharmacological interventions attempted prior to PRN medication administration:  Decrease stimuli, structured setting.    Brief narrative of reason for PRN medication administration: Talking to unseen others, pacing in dark room, loud and arguing conversation with unseen,

## 2021-10-06 NOTE — Group Note
Name: Kristin Maynard   MRN: [id number]     DOB: [birth year]      Age: 26 y.o.  Admission Date: 09/23/2021     LOS: 13 days     Date of Service: 10/06/2021      Group Topic: [place] Leisure Activities  Group Date: 10/06/2021  Start Time: 1245  End Time: 1345  Facilitators: Earl Lites          Number of Participants: 10  Group Focus: coping skills, healthy friendships, leisure skills, self-awareness, self-esteem, and social skills  Treatment Modality: Recreation Therapy  Interventions utilized were group exercise, leisure development, and mental fitness  Purpose: enhance coping skills, improve communication skills, regain self-worth, and reinforce self-care    Patients participated in chair and cardio exercise. Patients divided in to three groups and played [place], a game, as a team. Patients shared answers to the category questions and then shared with the group.      Name: Kristin Maynard Date of Birth: January 30, 1996   MR: [id number]      Level of Participation: Patient participated in the first exercise and asked her staff to go back to her room.   Interactions with others: did not interact  Mood/Affect: flat  Cognition: unable to assess  Progress: Minimal  Plan: to continue treatment

## 2021-10-06 NOTE — Progress Notes
Endocrine Chart Review Note:  She is a 26 year old with history of pituitary macroadenoma (1.4cm on recent CT) and schizophrenia, per chart review follows with Dr. Threasa Beards at [facility] for endocrinology.  His notes and prior imaging and lab are not available in our EMR.    Unclear when the macroprolactinoma was initially diagnosed, however per psychiatry notes here bromocriptine has been titrated up over unclear length of time as an outpatient with most recent dose being 25 mg nightly which she was admitted on.  She is now admitted to the inpatient psychiatry ward with psychosis symptoms.    Her prolactin level was checked on 3/13 here and was found to be 981.9.  It seems that her Dr. Threasa Beards was contacted and he recommended switching to cabergoline at a dose of 2.5 mg twice weekly (per note on 10/01/21).  A prolactin was subsequently checked on 10/06/2021 which is greater than 1000.    The dose of cabergoline Dr. Threasa Beards recommended is lower in equivalent dose to her PTA bromocriptine however it is reasonable when switching agents to start at a lower dose and titrate up due to side effects/tolerability.  At any rate it is too soon to recheck the prolactin to assess for response after only on 1 week, typically we check a level/titrate no sooner than 4 weeks.  If she is still here in that timeframe, would recommend reaching out to her outpatient endocrinologist Dr. Threasa Beards with the results to assist with further titration.    If there is concern that the dopamine agonist may be causing worsening psychosis, only way to assess for this would be to hold the dopamine agonist for a week or two and see if her symptoms improve.  If the dopamine agonist is convincingly contributing to the psychosis then an outpatient neurosurgical consult would be appropriate.  She may also be resistant to dopamine agonists (although unclear if she's been compliant) in which case that would also be an indication for surgery. But would defer that determination to her outpatient endocrinologist Dr. Threasa Beards.    Rolm Gala, MD       10/06/2021  Endocrinology, PGY4

## 2021-10-06 NOTE — Progress Notes
Psychotherapy Progress Note    NAME:Kristin Maynard MRN: [id number] DOB:April 19, 1996 AGE: 26 y.o.  ADMISSION DATE: 09/23/2021 DAYS ADMITTED: LOS: 13 days    Date of Service:  10/06/21    Principal Problem:    Schizophrenia ([place])  Active Problems:    Pituitary macroadenoma ([place])    Hyperprolactinemia (HCC)    Elevated BP without diagnosis of hypertension    Sinus tachycardia    Leukocytosis    Objective:  [name] will report a decrease in psychoses at least 24 hours prior to discharge.    Narrative:  SW met with patient on the unit for individual therapy session. Patient states she is feeling "okay, pretty good". SW asked patient to rate how she close she feels she is to her baseline. Per patient, she reports currently only being at 15% near her baseline with the medications. Patient shows good understanding into not being ready for discharge, and reports no therapy needs at this time.     Follow up:  SW to meet with patient Sunday to discuss progress

## 2021-10-06 NOTE — Group Note
Name: Kristin Maynard   MRN: [id number]     DOB: Nov 27, 1995      Age: 26 y.o.  Admission Date: 09/23/2021     LOS: 13 days     Date of Service: 10/06/2021      Group Topic: BH Emotion Regulation (DBT Skill)  Group Date: 10/06/2021  Start Time: 0900  End Time: 1000  Facilitators: Candie Chroman          Number of Participants: 10  Group Focus: feeling awareness/expression  Treatment Modality: Art Therapy  Interventions utilized were assignment and exploration  Purpose: enhance coping skills and express feelings    Group members were provided with air-dry clay and instructed to create feeling containers to promote feeling identification and affect regulation.          Name: Kristin Maynard Date of Birth: September 25, 1995   MR: [id number]      Level of Participation: moderate   Quality of Participation: cooperative  Interactions with others: minimal  Mood/Affect: flat  Triggers (if applicable): NA  Cognition: not focused  Progress: Moderate  Response: Engaged in art making for first half of group. Patient used materials to sculpt a tear drop shape and said it was "a soul"  Plan: to continue treatment

## 2021-10-06 NOTE — Group Note
Name: Kristin Maynard   MRN: [id number]     DOB: 1996-03-13      Age: 26 y.o.  Admission Date: 09/23/2021     LOS: 13 days     Date of Service: 10/06/2021      Group Topic: BH Coping Skills  Group Date: 10/06/2021  Start Time: 1100  End Time: 1200  Facilitators: Patric Dykes, PSYD          Number of Participants: 8  Group Focus: coping skills and diagnosis education  Treatment Modality: Group Psychotherapy and Psychoeducation  Interventions utilized were group exercise and patient education  Purpose: enhance coping skills, increase insight, and regain self-worth    Patients expressed an interest to speak about PTSD, coping skills and self-harm behaviors.  Writer engaged the group in an educational group exploring the nature of trauma and post traumatic experiences and the differences between adaptive and maladaptive coping skills, including in depth exploration of how and why to utilize deep breathing and some of the illusory myths of cutting and substance abuse.            Name: Kristin Maynard Date of Birth: March 18, 1996   MR: [id number]      Level of Participation: patient not present for group

## 2021-10-06 NOTE — Care Plan
Problem: Harm to self, high risk of suicide  Goal: Absence of Harm to Self  Outcome: Goal Ongoing  Flowsheets (Taken 10/05/2021 2106)  Absence of harm to self:   Assess and report significant changes in mood and affect   Maintain a safe environment   Reassess for suicide risk daily     Problem: Elopement Prevention  Goal: Patients without Decisional Capacity will not elope from the care setting  Outcome: Goal Ongoing     Problem: Mood - Altered  Goal: Stabilize mood  Outcome: Goal Ongoing  Flowsheets (Taken 10/05/2021 2106)  Stabilize mood:   Assess depressive symptoms   Assess ineffective coping signs and symptoms   Establish therapeutic relationships

## 2021-10-06 NOTE — Progress Notes
PRN Medication Administered:  Ambien 5 mg    Non pharmacological interventions attempted prior to PRN medication administration:  Verbal de-escalation techniques and Decrease stimuli, redirection, reality orientation    Brief narrative of reason for PRN medication administration:   Kristin Maynard woke up from brief rest screaming, trembling, talking to unseen others. Pt unable to sleep d/t AVH.

## 2021-10-06 NOTE — Care Plan
Problem: Mood - Altered  Goal: Stabilize mood  Outcome: Goal Ongoing  Flowsheets (Taken 10/06/2021 1048)  Stabilize mood:   Establish therapeutic relationships   Assess coping style     Problem: Thought Process - Altered  Goal: Demonstration of organized thought processes  Outcome: Goal Ongoing  Flowsheets (Taken 10/06/2021 1048)  Demonstration of organized thought processes:   Provide environmental regulation - agitation   Assess disturbed thought process signs and symptoms     Problem: Violence, self/other-directed, Risk of  Goal: Absence of violence  Outcome: Goal Ongoing  Flowsheets (Taken 10/06/2021 1048)  Absence of violence:   Museum/gallery exhibitions officer   Supervise medication intake

## 2021-10-06 NOTE — Progress Notes
Psychiatry Progress Note  Name: Kristin Maynard          MRN: [id number] DOB: 15-Aug-1995         Age: 26 y.o.  Admission Date: 09/23/2021  LOS: 13 days    Service: [facility] Adult Psych A    Principal Problem:    Schizophrenia (HCC)  Active Problems:    Pituitary macroadenoma (HCC)    Hyperprolactinemia (HCC)    Elevated BP without diagnosis of hypertension    Sinus tachycardia    Leukocytosis    PLAN  1. Continue admission to red zone for disorganization and behaviors that are minimally redirectable and not appropriate for the general milieu at this time?  2. Continue Cabergoline 2.5mg  PO twice weekly (started on 3/20)  3. Abilify Maintena 400mg  last administered on 2/24  4. Continue Ativan 0.5mg  qam and 2mg  qhs for agitation and anxiety associated with psychosis (increased on 3/22)  5. Increased Seroquel to 600mg  qhs on 3/23 with plans to titrate up as tolerated   6. Continue Ambien 5mg  qhs for insomnia   7. Continue Trazodone 100mg  qhs  8. EKG deferred as patient is unwilling to complete at this time   9. Reviewed labs.  Consulted Endocrinology on 3/24 due to prolactin level greater than 1000.  10. Continue to monitor BP and pulse which have improved with the addition of Ativan and were likely induced by anxiety and bothersome hallucinations   ?     REASONS FOR CONTINUED HOSPITALIZATION  Psychiatric stabilization and medication management. Patient is high risk for re-admission if discharged at this time         ______________________________________________________________  SUBJECTIVE  Kristin Maynard was seen in her hospital room.  She reports that she is okay today.  States that the voices are barely there.  Denies SI/HI.    Nursing staff reports that she continues to respond to internal stimuli and was also noted to be chanting in the red zone.    REVIEW OF SYSTEMS  Review of Systems   Constitutional: Positive for fatigue. Negative for activity change and appetite change.   Psychiatric/Behavioral: Positive for decreased concentration, hallucinations and sleep disturbance. Negative for dysphoric mood, self-injury and suicidal ideas.     ______________________________________________________________  OBJECTIVE                 Vital Signs:  Current                Vital Signs: 24 Hour Range   BP: 126/94 (03/24 0800)  Temp: 36.2 ?C (97.2 ?F) (03/24 0800)  Pulse: 124 (03/24 0800)  Respirations: 18 PER MINUTE (03/24 0800)  SpO2: 97 % (03/24 0800) BP: (126-134)/(88-94)   Temp:  [36.2 ?C (97.2 ?F)-36.5 ?C (97.7 ?F)]   Pulse:  [99-124]   Respirations:  [16 PER MINUTE-18 PER MINUTE]   SpO2:  [97 %-98 %]    Intensity Pain Scale (Self Report): (not recorded)      Scheduled Medications:  cabergoline (DOSTINEX) (+) tablet 0.5 mg **PATIENT OWN MED**, 0.5 mg, Oral, Once per day on Mon Thu  levonorgestrel-ethinyl estradiol (AVIANE-28, LESSINA-28) tablet 1 tablet, 1 tablet, Oral, QDAY  lidocaine (LIDODERM) 5 % topical patch 1 patch, 1 patch, Topical, QDAY  LORazepam (ATIVAN) tablet 0.5 mg, 0.5 mg, Oral, QDAY  LORazepam (ATIVAN) tablet 2 mg, 2 mg, Oral, QHS  melatonin tablet 5 mg, 5 mg, Oral, QHS  metoprolol tartrate tablet 25 mg, 25 mg, Oral, QDAY  QUEtiapine (SEROquel) tablet 600 mg, 600 mg, Oral,  QHS  traZODone (DESYREL) tablet 100 mg, 100 mg, Oral, QHS        PRN Medications:  acetaminophen Q6H PRN 650 mg at 10/03/21 0812, calcium carbonate TID PRN, hydrOXYzine Q6H PRN 25 mg at 10/04/21 2235, [DISCONTINUED] OLANZapine Q6H PRN 5 mg at 10/02/21 0348 **OR** OLANZapine Q6H PRN, polyethylene glycol 3350 QDAY PRN, QUEtiapine Q6H PRN 100 mg at 10/05/21 0454, senna/docusate QDAY PRN, zolpidem QHS PRN 5 mg at 10/05/21 2202    Mental Status Exam:    General/Constitutional: 26 year old female who appears her approximate stated age.  Dressed appropriately in hospital attire.  Speech: normal rate, rhythm, volume, and tone  Thought Process: Disorganized  Thought Content: Denies SI/HI  Perception: Reports ongoing AH/VH.  Noted to respond to internal stimuli  Associations: intact  Mood: Okay  Affect: Blunted  Insight/Judgement: Poor/poor    Orientation: grossly intact  Recent and remote memory: intact  Attention span and concentration: appropriate  Language: fluent, [place] speaker  [employer] of knowledge and vocabulary: appropriate    Focused Physical Exam:  Musculoskeletal: normal gait      ______________________________________________________________  Rutherford Limerick, DO

## 2021-10-06 NOTE — Behavioral Health Treatment Team
TREATMENT TEAM NOTE  Name: Daffney Greenly          MRN: [id number]              DOB: June 02, 1996          Age: 26 y.o.  Admission Date: 09/23/2021                 Attendees:   Psychiatrist: Don Broach, DO  Psychiatrist: Alfredia Ferguson, APRN  Nurse Supervisor: Cyndia Skeeters, RN, BSN  Nurse: Dede Query, RN  Pharmacist: Lurlean Nanny, PharmD  Therapist: Dewayne Hatch, [place], [place]  Therapist: Neita Carp, [place]  Case Manager: Carmela Rima, LMSW  Case Manager: Tinnie Gens, LMSW  Case Manager: April Cordero, [place]  Utilization Review: Areatha Keas, RN        Significant Points Discussed: anxiety=2, depression=2; passive SI and contracts for safety; AH and responding to internal stimuli; napped for 4 hours; briefly on the milieu for a group and dinner; slept 4.25 hours       Treatment Plan Discussion: working on medication adjustments       Projected Discharge Date: TBD

## 2021-10-06 NOTE — Care Coordination-Inpatient
Care Coordination - Inpatient Note      Patient Name:   Kristin Maynard                        MRN:  [id number]  Admission Date:  09/23/2021    2:45-2:49pm  CM spoke with patient's father/DPOA,Dennis Corinna Capra [phone]), to discuss his visit with the patient on Wednesday. Per [name], the visit went "really well". [name] reports the patient still asked to go home but was redirectable. [name] reports the visit lasted about 30 minutes before the patient got drowsy, but states she was in good spirits. [name] reports the patient did call him the next day to ask again if he can take her home. Per [name], patient is still "far" from her baseline but has been making some noticeable improvements. [name] is going to work on visiting patient this weekend and reports no additional questions/concerns for this Probation officer. CM to continue following up as needed to discuss discharge.     Carmela Rima  10/06/2021

## 2021-10-07 NOTE — Care Plan
Problem: Mood - Altered  Goal: Stabilize mood  Outcome: Goal Ongoing  Flowsheets (Taken 10/06/2021 2213)  Stabilize mood:   Assess coping style   Assess ineffective coping signs and symptoms   Assess depression history   Establish therapeutic relationships   Assess depressive symptoms  Goal: Knowledge of Altered Mood  Outcome: Goal Ongoing  Flowsheets (Taken 10/06/2021 2213)  Knowledge of altered mood: Provide relaxation techniques education     Problem: Thought Process - Altered  Goal: Demonstration of organized thought processes  Outcome: Goal Ongoing  Flowsheets (Taken 10/06/2021 2213)  Demonstration of organized thought processes:   Assess cognitive ability   Assess thought process   Assess disturbed thought process signs and symptoms   Provide environmental regulation - agitation   Assess in reality orientation   Assess risk for impaired cognition

## 2021-10-07 NOTE — Group Note
Name: Kristin Maynard   MRN: [id number]     DOB: 1995-11-03      Age: 26 y.o.  Admission Date: 09/23/2021     LOS: 14 days     Date of Service: 10/07/2021      Group Topic: BH Coping Skills  Group Date: 10/07/2021  Start Time: 0900  End Time: 0957  Facilitators: Earl Lites          Number of Participants: 9  Group Focus: coping skills, leisure skills, relaxation, self-awareness, self-esteem, and social skills  Treatment Modality: Recreation Therapy  Interventions utilized were group exercise, leisure development, and mental fitness  Purpose: enhance coping skills, improve communication skills, increase insight, regain self-worth, and reinforce self-care      Patients participated in walking and  cardio exercise to music. Patients stretched to music. Patients were given a domino and they found another peer with the same number on their domino. Patients found out their name and a couple of things about that person. Patients then shared with the group the persons first name and some things about them. Patients were asked how they start off their morning and listened to a silly song about putting a smile upon your face. Patients then participated in breathing meditation.    Name: Kristin Maynard Date of Birth: Sep 12, 1995   MR: [id number]      Level of Participation: patient not present for group, patient was tired from not sleeping well.  Plan: to continue treatment

## 2021-10-07 NOTE — Progress Notes
Kristin Maynard spoke with mother on phone and then her father on the phone during tech time.  Kristin Maynard is able to have a conversation with no noted internal stimuli.  She is making sense, describing her groups and her day.

## 2021-10-07 NOTE — Progress Notes
Pt initially refused to take her medication and threw them across the room stating they were poison. Pt continued to be agitated, responding to internal stimuli and was yelling at times stating that people were trying to kill her. Pt was de-escalated and patient agreed to take HS medication.

## 2021-10-07 NOTE — Group Note
Name: Kristin Maynard   MRN: [id number]     DOB: [birth year]      Age: 26 y.o.  Admission Date: 09/23/2021     LOS: 14 days     Date of Service: 10/07/2021      Group Topic: BH Emotion Regulation (DBT Skill)  Group Date: 10/07/2021  Start Time: 9201  End Time: 1100  Facilitators: Daiva Nakayama, [place]          Number of Participants: 10  Group Focus: feeling awareness/expression  Treatment Modality: Cognitive Behavioral Therapy  Interventions utilized were assignment, confrontation, exploration, and story telling  Purpose: express feelings, improve communication skills, and increase insight          Name: Kristin Maynard Date of Birth: [birth year]   MR: [id number]      Level of Participation: patient not present for group     Plan: to continue treatment

## 2021-10-07 NOTE — Group Note
Name: Kristin Maynard   MRN: [id number]     DOB: 11/10/1995      Age: 26 y.o.  Admission Date: 09/23/2021     LOS: 14 days     Date of Service: 10/07/2021      Group Topic: BH Coping Skills  Group Date: 10/07/2021  Start Time: 1300  End Time: 1345  Facilitators: Daiva Nakayama, [place]          Number of Participants: 9  Group Focus: coping skills  Treatment Modality: Cognitive Behavioral Therapy  Interventions utilized were group exercise  Purpose: enhance coping skills          Name: Kristin Maynard Date of Birth: 1996-05-13   MR: [id number]      Level of Participation: active   Quality of Participation: attentive and cooperative  Interactions with others: gave feedback  Mood/Affect: appropriate  Triggers (if applicable): none noted  Cognition: processing slowly  Progress: Moderate  Response: Pt was present entire group. Pt was not engaged in group discussion but did very well in group activity. Support ongoing  Plan: to continue treatment

## 2021-10-07 NOTE — Progress Notes
Psychiatry Progress Note  Name: Kristin Maynard          MRN: [id number] DOB: 1995-08-10         Age: 26 y.o.  Admission Date: 09/23/2021  LOS: 14 days    Service: [facility] Adult Psych A    Principal Problem:    Schizophrenia (HCC)  Active Problems:    Pituitary macroadenoma (HCC)    Hyperprolactinemia (HCC)    Elevated BP without diagnosis of hypertension    Sinus tachycardia    Leukocytosis    PLAN  1. No medication changes made 10/07/2021    2. Continue admission to red zone for disorganization and behaviors that are minimally redirectable and not appropriate for the general milieu at this time?  3. Continue Cabergoline 2.5mg  PO twice weekly   4. Abilify Maintena 400mg  last administered on 2/24  5. Continue Ativan 0.5mg  PO QAM and 2mg  PO QHS for agitation and anxiety associated with psychosis  6. Continue Seroquel 600mg  PO QHS on 3/23 with plans to titrate up as tolerated   1. Consider transition to Clozaril if not improving.  7. Continue Ambien 5mg  PO QHS for insomnia   8. Continue Trazodone 100mg  PO QHS  9. EKG deferred as patient is unwilling to complete at this time   10. Endocrinology advised to continue cabergoline for 4 weeks and recheck level at that time.  11. Continue to monitor BP and pulse which have improved with the addition of Ativan and were likely induced by anxiety and bothersome hallucinations   ?   REASONS FOR CONTINUED HOSPITALIZATION  Psychiatric stabilization and medication management. Patient is high risk for re-admission if discharged at this time       ______________________________________________________________  SUBJECTIVE  Overnight events: Patient agitated and threw medication but eventually was verbally redirectable and no medication PRNs required  Patient was seen in her room this morning and reports that she is overall doing okay.  She reports that she slept all right that her appetite is good.  She rates her anxiety as a 2/10 with 10 being the worst denies any depression.  She denies any SI/HI or AVH but still notes a little paranoia.  Patient had no other acute concerns or complaints.    REVIEW OF SYSTEMS  Review of Systems   Constitutional: Negative for chills and fever.   Respiratory: Negative for shortness of breath.    Cardiovascular: Negative for chest pain.   Gastrointestinal: Negative for abdominal pain, constipation, diarrhea, nausea and vomiting.   Skin: Negative for rash.   Neurological: Negative for dizziness, light-headedness and headaches.     ______________________________________________________________  OBJECTIVE                 Vital Signs:  Current                Vital Signs: 24 Hour Range   BP: 127/79 (03/25 0800)  Temp: 36.1 ?C (97 ?F) (03/25 0800)  Pulse: 115 (03/25 0800)  Respirations: 16 PER MINUTE (03/25 0800)  SpO2: 98 % (03/25 0800) BP: (127)/(79)   Temp:  [36.1 ?C (97 ?F)-36.3 ?C (97.3 ?F)]   Pulse:  [111-115]   Respirations:  [16 PER MINUTE-18 PER MINUTE]   SpO2:  [96 %-98 %]    Intensity Pain Scale (Self Report): (not recorded)      Scheduled Medications:  cabergoline (DOSTINEX) (+) tablet 0.5 mg **PATIENT OWN MED**, 0.5 mg, Oral, Once per day on Mon Thu  levonorgestrel-ethinyl estradiol (AVIANE-28, LESSINA-28) tablet 1  tablet, 1 tablet, Oral, QDAY  lidocaine (LIDODERM) 5 % topical patch 1 patch, 1 patch, Topical, QDAY  LORazepam (ATIVAN) tablet 0.5 mg, 0.5 mg, Oral, QDAY  LORazepam (ATIVAN) tablet 2 mg, 2 mg, Oral, QHS  melatonin tablet 5 mg, 5 mg, Oral, QHS  metoprolol tartrate tablet 25 mg, 25 mg, Oral, QDAY  QUEtiapine (SEROquel) tablet 600 mg, 600 mg, Oral, QHS  traZODone (DESYREL) tablet 100 mg, 100 mg, Oral, QHS        PRN Medications:  acetaminophen Q6H PRN 650 mg at 10/03/21 0812, calcium carbonate TID PRN, hydrOXYzine Q6H PRN 25 mg at 10/04/21 2235, [DISCONTINUED] OLANZapine Q6H PRN 5 mg at 10/02/21 0348 **OR** OLANZapine Q6H PRN, polyethylene glycol 3350 QDAY PRN, QUEtiapine Q6H PRN 100 mg at 10/06/21 1142, senna/docusate QDAY PRN, zolpidem QHS PRN 5 mg at 10/06/21 2118    Mental status examination:  ? General/Constitutional: appears stated age, dressed in hospital clothes, fair grooming  ? Eye Contact: good  ? Behavior: Calm, cooperative; appropriate for conversation  ? Speech: RRR with normal volume and tone. Good articulation  ? Mood: Good  ? Affect: Content ; mood congruent  ? Thought Process: Linear and goal directed  ? Thought Content: Denies SI, HI. No evidence of delusions. Endorses Paraonoia  ? Perception: Denies AVH  ? Associations: Intact  ? Insight/Judgment: fair/fair    Physical Exam:  ? Neuro: Gait Normal  ? Musculoskeletal: Moves all four extremities spontaneously       ______________________________________________________________  Rae Mar, MD

## 2021-10-07 NOTE — Care Plan
Problem: Elopement Prevention  Goal: Patients without Decisional Capacity will not elope from the care setting  Outcome: Goal Ongoing     Problem: Thought Process - Altered  Goal: Demonstration of organized thought processes  Outcome: Goal Ongoing  Flowsheets (Taken 10/06/2021 2213 by Carolina Cellar, RN)  Demonstration of organized thought processes:   Assess cognitive ability   Assess thought process   Assess disturbed thought process signs and symptoms   Provide environmental regulation - agitation   Assess in reality orientation   Assess risk for impaired cognition     Problem: Violence, self/other-directed, Risk of  Goal: Absence of violence  Outcome: Goal Ongoing  Flowsheets (Taken 10/07/2021 1015)  Absence of violence:   Supervise medication intake   Environmental safety management

## 2021-10-07 NOTE — Group Note
Name: Marg Macmaster   MRN: [id number]     DOB: Apr 02, 1996      Age: 26 y.o.  Admission Date: 09/23/2021     LOS: 14 days     Date of Service: 10/07/2021      Group Topic: [place] Leisure Activities  Group Date: 10/07/2021  Start Time: 1115  End Time: 1200  Facilitators: Earl Lites          Number of Participants: 10  Group Focus: coping skills, leisure skills, and social skills  Treatment Modality: Leisure Development  Interventions utilized were group exercise and leisure development  Purpose: enhance coping skills, improve communication skills, increase insight, regain self-worth, and reinforce self-care    Patients choose to play Sequence a team game.      Name: Seth Date of Birth: [birth year]   MR: [id number]      Level of Participation: patient not present for group   Plan: to continue treatment

## 2021-10-08 MED ORDER — ONDANSETRON 4 MG PO TBDI
4 mg | ORAL | 0 refills | Status: DC | PRN
Start: 2021-10-08 — End: 2021-11-02
  Administered 2021-10-08 – 2021-10-15 (×2): 4 mg via ORAL

## 2021-10-08 NOTE — Progress Notes
Kristin Maynard had an emesis in the dining room about 20 minutes beofre dinner.  She was in the dining room walking the entirety of the outside area of the table and chairs and other peers were seated in the dining room socializing. Atfer the incident Kristin Maynard did return to Red Zone to decrease stimuli and did feel ready to eat in dining room with others at 1700.      Order for Zofran obtained and given.  No further emesis after and no c/o's of nausea.     Later when speaking with Kristin Maynard she states before she had the emesis she felt like "a lot of eyes were looking upon me."

## 2021-10-08 NOTE — Group Note
Name: Kristin Maynard   MRN: [id number]     DOB: [birth year]      Age: 27 y.o.  Admission Date: 09/23/2021     LOS: 15 days     Date of Service: 10/08/2021      Group Topic: [place] Leisure Activities  Group Date: 10/08/2021  Start Time: 1300  End Time: 1400  Facilitators: Earl Lites          Number of Participants: 13  Group Focus: healthy friendships, leisure skills, self-awareness, self-esteem, and social skills  Treatment Modality: Leisure Development  Interventions utilized were group exercise, leisure development, and mental fitness  Purpose: enhance coping skills, improve communication skills, regain self-worth, and reinforce self-care    Patients choose a game they would like to play. Everyone choose Sequence again so there were two different tables of Sequence.      Name: Kristin Maynard Date of Birth: [birth year]   MR: [id number]      Level of Participation: active   Quality of Participation: attentive and cooperative  Interactions with others: gave feedback  Mood/Affect: appropriate and flat  Cognition: goal directed  Progress: Moderate  Plan: to continue treatment

## 2021-10-08 NOTE — Progress Notes
Psychiatry Progress Note  Name: Kristin Maynard          MRN: [id number] DOB: 11-Aug-1995         Age: 26 y.o.  Admission Date: 09/23/2021  LOS: 15 days    Service: [facility] Adult Psych A    Principal Problem:    Schizophrenia (HCC)  Active Problems:    Pituitary macroadenoma (HCC)    Hyperprolactinemia (HCC)    Elevated BP without diagnosis of hypertension    Sinus tachycardia    Leukocytosis    PLAN  1. No medication changes made 10/08/2021    2. Continue admission to red zone for disorganization and behaviors that are minimally redirectable and not appropriate for the general milieu at this time?  3. Continue Cabergoline 2.5mg  PO twice weekly   4. Abilify Maintena 400mg  last administered on 2/24  5. Continue Ativan 0.5mg  PO QAM and 2mg  PO QHS for agitation and anxiety associated with psychosis  6. Continue Seroquel 600mg  PO QHS on 3/23 with plans to titrate up as tolerated   1. Consider transition to Clozaril if not improving.  7. Continue Ambien 5mg  PO QHS for insomnia   8. Continue Trazodone 100mg  PO QHS  9. EKG deferred as patient is unwilling to complete at this time   10. Endocrinology advised to continue cabergoline for 4 weeks and recheck level at that time.  11. Continue to monitor BP and pulse which have improved with the addition of Ativan and were likely induced by anxiety and bothersome hallucinations   ?   REASONS FOR CONTINUED HOSPITALIZATION  Psychiatric stabilization and medication management. Patient is high risk for re-admission if discharged at this time       ______________________________________________________________  SUBJECTIVE  Overnight events: Patient given Ambien 5mg  PO x1 overnight for insomnia.  Patient was seen in the red zone and reports that she slept pretty good that her appetite is okay.  She rates her mood as a 9/10 with 10 being the best.  She denies any depression or anxiety.  Denies any SI/HI or AVH.  She only notes mild paranoia.  She continues to tolerate all of her medications.  She had no other acute concerns or complaints.    REVIEW OF SYSTEMS  Review of Systems   Constitutional: Negative for chills and fever.   Respiratory: Negative for shortness of breath.    Cardiovascular: Negative for chest pain.   Gastrointestinal: Negative for abdominal pain, constipation, diarrhea, nausea and vomiting.   Skin: Negative for rash.   Neurological: Negative for dizziness, light-headedness and headaches.     ______________________________________________________________  OBJECTIVE                 Vital Signs:  Current                Vital Signs: 24 Hour Range   BP: 122/87 (03/26 0800)  Temp: 36.3 ?C (97.3 ?F) (03/26 0800)  Pulse: 130 (03/26 0800)  Respirations: 16 PER MINUTE (03/26 0800)  SpO2: 97 % (03/26 0800) BP: (122-130)/(83-87)   Temp:  [36.3 ?C (97.3 ?F)]   Pulse:  [105-130]   Respirations:  [16 PER MINUTE-18 PER MINUTE]   SpO2:  [97 %-100 %]    Intensity Pain Scale (Self Report): (not recorded)      Scheduled Medications:  cabergoline (DOSTINEX) (+) tablet 0.5 mg **PATIENT OWN MED**, 0.5 mg, Oral, Once per day on Mon Thu  levonorgestrel-ethinyl estradiol (AVIANE-28, LESSINA-28) tablet 1 tablet, 1 tablet, Oral, QDAY  lidocaine (  LIDODERM) 5 % topical patch 1 patch, 1 patch, Topical, QDAY  LORazepam (ATIVAN) tablet 0.5 mg, 0.5 mg, Oral, QDAY  LORazepam (ATIVAN) tablet 2 mg, 2 mg, Oral, QHS  melatonin tablet 5 mg, 5 mg, Oral, QHS  metoprolol tartrate tablet 25 mg, 25 mg, Oral, QDAY  QUEtiapine (SEROquel) tablet 600 mg, 600 mg, Oral, QHS  traZODone (DESYREL) tablet 100 mg, 100 mg, Oral, QHS        PRN Medications:  acetaminophen Q6H PRN 650 mg at 10/03/21 0812, calcium carbonate TID PRN, hydrOXYzine Q6H PRN 25 mg at 10/04/21 2235, [DISCONTINUED] OLANZapine Q6H PRN 5 mg at 10/02/21 0348 **OR** OLANZapine Q6H PRN, polyethylene glycol 3350 QDAY PRN, QUEtiapine Q6H PRN 100 mg at 10/06/21 1142, senna/docusate QDAY PRN, zolpidem QHS PRN 5 mg at 10/07/21 2012    Mental status examination:  ? General/Constitutional: appears stated age, dressed in hospital clothes, fair grooming  ? Eye Contact: good  ? Behavior: Calm, cooperative; appropriate for conversation  ? Speech: RRR with normal volume and tone. Good articulation  ? Mood: Good  ? Affect: Content ; mood congruent  ? Thought Process: Linear and goal directed  ? Thought Content: Denies SI, HI. No evidence of delusions. Endorses Paraonoia  ? Perception: Denies AVH  ? Associations: Intact  ? Insight/Judgment: Good/Good    Physical Exam:  ? Neuro: Gait Normal  ? Musculoskeletal: Moves all four extremities spontaneously       ______________________________________________________________  Rae Mar, MD

## 2021-10-08 NOTE — Progress Notes
PRN Medication Administered:  Ambien '5mg'$   Non pharmacological interventions attempted prior to PRN medication administration:  Increase structure and limit setting and decreased stimuli    Brief narrative of reason for PRN medication administration: Ambien given to help sleep.

## 2021-10-08 NOTE — Care Plan
Problem: Elopement Prevention  Goal: Patients without Decisional Capacity will not elope from the care setting  Outcome: Goal Ongoing     Problem: Mood - Altered  Goal: Stabilize mood  Outcome: Goal Ongoing  Flowsheets (Taken 10/08/2021 1117)  Stabilize mood: Assess ineffective coping signs and symptoms     Problem: Thought Process - Altered  Goal: Demonstration of organized thought processes  Outcome: Goal Ongoing  Flowsheets (Taken 10/06/2021 2213 by Carolina Cellar, RN)  Demonstration of organized thought processes:   Assess cognitive ability   Assess thought process   Assess disturbed thought process signs and symptoms   Provide environmental regulation - agitation   Assess in reality orientation   Assess risk for impaired cognition

## 2021-10-08 NOTE — Care Plan
Problem: Harm to self, high risk of suicide  Goal: Absence of Harm to Self  Flowsheets (Taken 10/07/2021 2301)  Absence of harm to self:   Maintain a safe environment   Complete safety plan     Problem: Mood - Altered  Goal: Stabilize mood  Flowsheets (Taken 10/07/2021 2301)  Stabilize mood: Assess depressive symptoms  Goal: Knowledge of Altered Mood  Flowsheets (Taken 10/07/2021 2301)  Knowledge of altered mood: Provide relaxation techniques education

## 2021-10-08 NOTE — Group Note
Name: Kristin Maynard   MRN: [id number]     DOB: [birth year]      Age: 26 y.o.  Admission Date: 09/23/2021     LOS: 15 days     Date of Service: 10/08/2021      Group Topic: BH Mindfulness  Group Date: 10/08/2021  Start Time: 0930  End Time: 1504  Facilitators: Earl Lites          Number of Participants: 12  Group Focus: anxiety, coping skills, leisure skills, relaxation, self-awareness, self-esteem, and social skills  Treatment Modality: Recreation Therapy  Interventions utilized were exploration, group exercise, leisure development, and mental fitness  Purpose: enhance coping skills, express feelings, improve communication skills, increase insight, regain self-worth, and reinforce self-care    Patients participated in exercises. Patients filled out a worksheet about what makes them happy. Patients shared their name and some things on their worksheet. Patients then listened to easy listening music, colored a picture with air balloons and talked about the benefits of coloring and listening to relaxing music.       Name: [name] Date of Birth: 17-Aug-1995   MR: [id number]      Level of Participation: moderate   Quality of Participation: drowsy and quiet  Interactions with others: gave feedback  Mood/Affect: blunted, bored and flat  Cognition: unable to assess  Progress: moderate  Response: Patient likes to exercise but was not happy about the activity. Patient said she colored her picture but there was no color on it. Patient said she colored it white. Patient eventually colored a little.   Plan: to continue treatment

## 2021-10-08 NOTE — Care Coordination-Inpatient
Care Coordination - Inpatient Note      Patient Name:   Kristin Maynard                        MRN:  [id number]  Admission Date:  09/23/2021    2:19-2:23pm  CM spoke with patient's father/DPOA,Dennis Corinna Capra [phone]), to check-in. [name] confirmed he will be visiting tonight around 6pm with patient's two brothers and sister-in-law. [name] asked for a brief update on how the patient is doing. CM updated [name] based on conversation with patient. [name] reports no additional questions/concerns at this time. CM to inform patient about the visit.     Carmela Rima  10/08/2021

## 2021-10-08 NOTE — Group Note
Name: Leenah Seidner   MRN: [id number]     DOB: 09-28-1995      Age: 26 y.o.  Admission Date: 09/23/2021     LOS: 15 days     Date of Service: 10/08/2021      Group Topic: BH Mindfulness  Group Date: 10/08/2021  Start Time: 1100  End Time: 1200  Facilitators: Romolo Sieling          Number of Participants: 13  Group Focus: other meditation/mindfulness and relaxation  Treatment Modality: Dialectical Behavioral Therapy  Interventions utilized were clarification, exploration, patient education, problem solving, and support  Purpose: enhance coping skills, increase insight, and regain self-worth    Group members listened to a 10-minute meditation, and were then encouraged to discuss and learn about meditation/mindfulness.          Name: Riverlyn Date of Birth: 10-22-1995   MR: [id number]      Level of Participation: moderate   Quality of Participation: attentive, cooperative, engaged, motivated, offered feedback and quiet  Interactions with others: gave feedback  Mood/Affect: appropriate and brightens with interaction  Triggers (if applicable): N/A  Cognition: coherent/clear, goal directed and insightful  Progress: Moderate  Response: Patient present for group, and actively engaged in meditation. She added input during the discussion that demonstrated insight.   Plan: to continue treatment

## 2021-10-08 NOTE — Group Note
Name: Braelyn Jenson   MRN: [id number]     DOB: December 30, 1995      Age: 26 y.o.  Admission Date: 09/23/2021     LOS: 15 days     Date of Service: 10/08/2021      Group Topic: BH Mindfulness  Group Date: 10/08/2021  Start Time: 1400  End Time: 1500  Facilitators: Charlotte Crumb          Number of Participants: 11  Group Focus: communication, depression, medication education, and substance abuse education  Treatment Modality: Leisure Development  Interventions utilized were group exercise and patient education  Purpose: increase insight and reinforce self-care      Patients answered trivia questions regarding mental health and substance use to increase insight on those topics.    Name: Tallulah Date of Birth: 01-Dec-1995   MR: [id number]      Level of Participation: patient not present for group   Plan: to continue treatment

## 2021-10-08 NOTE — Progress Notes
Case Management Progress Note    NAME:Kristin Maynard MRN: [id number] DOB:[birth year] AGE: 26 y.o.  ADMISSION DATE: 09/23/2021 DAYS ADMITTED: LOS: 15 days     Date of Service: 10/08/2021  Service Start Time:  2pm  Service End Time:  2:05pm    2-2:05pm  CM met with patient in consult room to check-in and discuss case management needs. Patient reports she is feeling "real close" to her baseline and states the hallucinations are "a lot better". Patient states she has been learning coping skills in group, such as meditation, breathing, and dance/music. Patient does report feeling slightly more tired than usual. CM encouraged patient to discuss any medication concerns with her doctor. Patient verbalized understanding and reports no additional questions/concerns.     2:10-2:15pm  CM met with patient on the unit, per patient's request. Patient was in the day room and appeared to be in distress. Patient states she must be discharged by Friday, or she won't survive. Per patient, "I can't make it til then. I was this close to death last night", and held her thumb and forefinger together. CM and patient processed further. Patient states this was not a nightmare but real life. Patient became tearful stating she needs to have her dad visit because it will be the last time she is able to say goodbye. Patient was able to verbalize to nursing the need to go back to her room. CM to call patient's dad and will follow-up with patient as needed.

## 2021-10-09 MED ORDER — LORAZEPAM 1 MG PO TAB
1 mg | Freq: Two times a day (BID) | ORAL | 0 refills | Status: DC
Start: 2021-10-09 — End: 2021-10-11
  Administered 2021-10-09 – 2021-10-11 (×4): 1 mg via ORAL

## 2021-10-09 MED ORDER — QUETIAPINE 200 MG PO TAB
800 mg | Freq: Every evening | ORAL | 0 refills | Status: DC
Start: 2021-10-09 — End: 2021-10-12
  Administered 2021-10-10 – 2021-10-12 (×3): 800 mg via ORAL

## 2021-10-09 MED ORDER — LORAZEPAM 1 MG PO TAB
1 mg | Freq: Every day | ORAL | 0 refills | Status: DC
Start: 2021-10-09 — End: 2021-10-09

## 2021-10-09 MED ORDER — OLANZAPINE 10 MG IM SOLR
5 mg | INTRAMUSCULAR | 0 refills | Status: DC | PRN
Start: 2021-10-09 — End: 2021-10-18

## 2021-10-09 MED ORDER — ZOLPIDEM 5 MG PO TAB
10 mg | Freq: Every evening | ORAL | 0 refills | Status: DC | PRN
Start: 2021-10-09 — End: 2021-10-11
  Administered 2021-10-10 – 2021-10-11 (×2): 10 mg via ORAL

## 2021-10-09 MED ORDER — OLANZAPINE 5 MG PO TBDI
5 mg | Freq: Once | ORAL | 0 refills | Status: AC
Start: 2021-10-09 — End: ?

## 2021-10-09 MED ORDER — LORAZEPAM 1 MG PO TAB
1 mg | Freq: Every evening | ORAL | 0 refills | Status: DC
Start: 2021-10-09 — End: 2021-10-09

## 2021-10-09 MED ORDER — QUETIAPINE 100 MG PO TAB
100 mg | ORAL | 0 refills | Status: DC | PRN
Start: 2021-10-09 — End: 2021-10-18
  Administered 2021-10-11 – 2021-10-17 (×6): 100 mg via ORAL

## 2021-10-09 NOTE — Progress Notes
General Medicine [facility] Progress Note         Name: Kristin Maynard        MRN: [id number]          DOB: [birth year]            Age: 26 y.o.  Admission Date: 09/23/2021       LOS: 16 days    Date of Service: 10/09/2021                     Assessment  Principal Problem:    Schizophrenia (HCC)  Active Problems:    Pituitary macroadenoma (HCC)    Hyperprolactinemia (HCC)    Elevated BP without diagnosis of hypertension    Sinus tachycardia    Leukocytosis    26 y.o. female admitted to [facility] with the following medical issues:  1. Schizophrenia, Acute psychosis  2. Moderate hypertriglyceridemia  - Lipid panel 11/17/18: Total cholesterol 216, HDL 48, LDL 120, Triglycerides 238  - Lipid panel 09/29/21: total cholesterol 217, HDL 55, LDL 131, Triglycerides 241  3. Pituitary macroadenoma, Hyperprolactinemia  - Prolactin 303 (11/17/18) -> 479.8 (10/20/19) -> 583 (12/09/19) -> 651.5 (03/30/20) -> 814.7 (09/01/20) -> 925.9 (11/09/20)  - switched from bromocriptine to cabergoline by primary team, first dose on 3/20 planned  4. Sinus tachycardia  - Per chart review HR in 100-130s throughout hospital stay, most commonly 100-120 range  - EKG from 09/23/21 reviewed. Patient has sinus rhythm. HR 91, QTc 420. P waves are upright in leads I, II, aVL and negative in aVR  - blood pressures stable to hypertensive throughout hospital stay thus far  - Metoprolol resumed 09/27/21  - TSH 1.06  - CXR 09/23/21 - no acute cardiopulmonary abnormalities  - urine culture 3/16 with < 100,000 CF urogenital flora. Received 3 days of macrobid  - No prior history of DVT or PE, no clinical signs of DVT       - EKG on 3/24 with HR 75       - Patient denies any palpitations or symptoms on 3/27  5. Obesity, Metabolic syndrome - BMI 35.04  6. Contraception  - PTA on volnea, receving levonorgestral-ethinyl estradiol inpatient  - urine pregnancy negative 09/23/21  7. Leukocytosis   - intermittent since admission  - admission WBC 13.9 -> 13.1 (3/20)  8. Thromobocytosis  - intermittent since admission  - admission Plt 417   - this appears to be partially likely due to  Hemoconcentration as it resolved inpatient temporarily     Recommendations:  - encourage oral hydration and good PO intake  - it appears that tachycardia could be reflex from poor oral intake vs agitation/anxiety in setting of AVH vs possible inappropriate sinus tachycardia vs. Medication induced. Patient is hemodynamically stable overall and asymptomatic, given this we will continue to monitor and work this up. Continue metoprolol.   - Patient will likely benefit from cardiac evaluation if tachycardia ongoing as acute psychiatric illness stabilizes.    The patient is admitted to inpatient psychiatric unit.  The inpatient psychiatric team is following.  Internal medicine team was consulted for medical evaluation.  I reviewed patient medical history, vitals, medications, labs and available medical records.     Thanks for the consult, the patient has active medical issues that need monitoring and we will follow with you    General medicine can be reached out by Looking up [place] [facility] on Voalte or [employer] app for direct app communication  or by paging [phone]      Magda Paganini, MD  Pager [phone]   _________________________________________________________    Subjective  Kristin Maynard is a 26 y.o. female.  Patient was seen in follow up for tachycardia. She reports she has no current palpitations, history of blood clots, no leg swelling or warmth. She denies any shortness of breath. She endorses anxiety. She would like to go home soon. She has no presyncope or syncopal episodes. She states she is eating and drinking well. She again denies any signs of infection such as fevers, chills, rashes, shortness of breath. She denies lightheadedness or dizziness with standing. She does not take metoprolol at home. Tolerating medication well.     Medications  Scheduled Meds:cabergoline (DOSTINEX) (+) tablet 0.5 mg **PATIENT OWN MED**, 0.5 mg, Oral, Once per day on Mon Thu  levonorgestrel-ethinyl estradiol (AVIANE-28, LESSINA-28) tablet 1 tablet, 1 tablet, Oral, QDAY  lidocaine (LIDODERM) 5 % topical patch 1 patch, 1 patch, Topical, QDAY  LORazepam (ATIVAN) tablet 0.5 mg, 0.5 mg, Oral, QDAY  LORazepam (ATIVAN) tablet 2 mg, 2 mg, Oral, QHS  melatonin tablet 5 mg, 5 mg, Oral, QHS  metoprolol tartrate tablet 25 mg, 25 mg, Oral, QDAY  QUEtiapine (SEROquel) tablet 600 mg, 600 mg, Oral, QHS  traZODone (DESYREL) tablet 100 mg, 100 mg, Oral, QHS    Continuous Infusions:  PRN and Respiratory Meds:acetaminophen Q6H PRN, calcium carbonate TID PRN, hydrOXYzine Q6H PRN, [DISCONTINUED] OLANZapine Q6H PRN **OR** OLANZapine Q6H PRN, ondansetron Q6H PRN, polyethylene glycol 3350 QDAY PRN, QUEtiapine Q6H PRN, senna/docusate QDAY PRN, zolpidem QHS PRN    Review of Systems: negative except as noted above    Objective:                          Vital Signs: Last Filed                 Vital Signs: 24 Hour Range   BP: 142/94 (03/26 1950)  Temp: 36.3 ?C (97.3 ?F) (03/26 1950)  Pulse: 127 (03/26 1950)  Respirations: 20 PER MINUTE (03/26 1950)  SpO2: 98 % (03/26 1950)  O2 Device: None (Room air) (03/26 1950) BP: (142)/(94)   Temp:  [36.3 ?C (97.3 ?F)]   Pulse:  [127]   Respirations:  [20 PER MINUTE]   SpO2:  [98 %]   O2 Device: None (Room air)     Vitals:    09/23/21 1034 09/23/21 2120 09/26/21 1421   Weight: 88.5 kg (195 lb) 88.1 kg (194 lb 3.6 oz) 86.9 kg (191 lb 9.6 oz)       Intake/Output Summary:  (Last 24 hours)  No intake or output data in the 24 hours ending 10/09/21 1046        Physical Exam:  Gen: alert, cooperative, anxious appearing  Head: Normocephalic, without obvious abnormality, atraumatic   Neck: supple, symmetrical, trachea midline,   Lungs: clear to auscultation bilaterally, no wheezes, rales, rhonchi   Heart: HR ~ 100. Regular rhythm, S1, S2 normal, no murmur, click, rub or gallop   Abdomen: soft, non-tender. Bowel sounds normal. No masses, no organomegaly, non-distended   Extremities: no lower extremity edema. No  Calf swelling, warmth or erythema.   Neurologic: fluent speech. Moves all 4 extremities spontaneously.    Lab Review  24-hour labs:    No results found for this visit on 09/23/21 (from the past 24 hour(s)).    Point of Care Testing  (Last 24 hours)  EKG from 09/23/21 reviewed. Patient has sinus rhythm. HR 91, QTc 420. P waves are upright in leads I, II, aVL and negative in aVR    Magda Paganini, MD  Available on [place] or [employer] app under [facility]    pager [phone]

## 2021-10-09 NOTE — Progress Notes
PRN Medication Administered:  Ambien 5 mg  Non pharmacological interventions attempted prior to PRN medication administration:  Decrease stimuli, distraction, turn down lights    Brief narrative of reason for PRN medication administration:   Pt requested PRN medication to help her sleep.

## 2021-10-09 NOTE — Group Note
Name: Kristin Maynard   MRN: [id number]     DOB: 09/07/1995      Age: 26 y.o.  Admission Date: 09/23/2021     LOS: 16 days     Date of Service: 10/09/2021      Group Topic: [place] Leisure Activities  Group Date: 10/09/2021  Start Time: 1015  End Time: 1100  Facilitators: Earl Lites          Number of Participants: 9  Group Focus: coping skills, leisure skills, self-awareness, self-esteem, and social skills  Treatment Modality: Recreation Therapy  Interventions utilized were leisure development and other Pet therapy  Purpose: enhance coping skills, improve communication skills, regain self-worth, and reinforce self-care    Patients enjoyed petting the therapy dog and sharing stories about their pets during Pet Therapy.      Name: Kristin Maynard Date of Birth: 11-Dec-1995   MR: [id number]      Level of Participation: patient not present for group   Plan: to continue treatment

## 2021-10-09 NOTE — Progress Notes
PRN Medication Administered: 1020  Seroquel 100 mg  Non pharmacological interventions attempted prior to PRN medication administration:  Decrease stimuli and Increase structure and limit setting    Brief narrative of reason for PRN medication administration: Pt fixated on wanting to leave today, pt noted screaming when she returned to red zone, causing herself to have a small amount of emesis on floor, then purposely stepping into it, stating "this might get me out of here". Pt had initially refused PRN, when offered a second time, she accepted.

## 2021-10-09 NOTE — Care Plan
Problem: Skin Integrity  Goal: Skin integrity intact  Outcome: Goal Ongoing  Goal: Healing of skin (Wound & Incision)  Outcome: Goal Ongoing  Goal: Healing of skin (Pressure Injury)  Outcome: Goal Ongoing     Problem: Harm to self, high risk of suicide  Goal: Absence of Harm to Self  Outcome: Goal Ongoing     Problem: Elopement Prevention  Goal: Patients without Decisional Capacity will not elope from the care setting  Outcome: Goal Ongoing     Problem: High Fall Risk  Goal: High Fall Risk  Outcome: Goal Ongoing     Problem: Mood - Altered  Goal: Stabilize mood  Outcome: Goal Ongoing  Goal: Knowledge of Altered Mood  Outcome: Goal Ongoing     Problem: Self-esteem - Low  Goal: Demonstration of positive self-esteem  Outcome: Goal Ongoing     Problem: Thought Process - Altered  Goal: Demonstration of organized thought processes  Outcome: Goal Ongoing     Problem: Violence, self/other-directed, Risk of  Goal: Absence of violence  Outcome: Goal Ongoing  Goal: Knowledge of risk for violence, self/other-directed  Outcome: Goal Ongoing     Problem: Moderate Fall Risk  Goal: Moderate Fall Risk  Outcome: Goal Ongoing

## 2021-10-09 NOTE — Progress Notes
Pt has remained calm since 1100 this morning, has continued to respond to internal stimuli, perseverated on speaking with her provider or her entire care team at one single meeting to ask about discharging this week. Pt has expressed her belief that I am God and the world is going to end in two days, that is why I need to leave and save others". Pt ate lunch and dinner without n/v.

## 2021-10-09 NOTE — Progress Notes
Pt had another episode of emesis after entering her room, unable to identify remains of PRN medication given 20 minutes prior

## 2021-10-09 NOTE — Group Note
Name: Kristin Maynard   MRN: [id number]     DOB: Feb 05, 1996      Age: 26 y.o.  Admission Date: 09/23/2021     LOS: 16 days     Date of Service: 10/09/2021      Group Topic: [place] Leisure Activities  Group Date: 10/09/2021  Start Time: 0915  End Time: 1000  Facilitators: Carmela Rima          Number of Participants: 8  Group Focus: coping skills and leisure skills  Treatment Modality: Leisure Development  Interventions utilized were group exercise  Purpose: enhance coping skills and improve communication skills    CM facilitated group for patients to play UNO together. Patients were split into two different groups.     Name: [name] Date of Birth: [birth year]   MR: [id number]      Level of Participation: active   Quality of Participation: attentive, cooperative and engaged  Interactions with others: gave feedback  Mood/Affect: flat  Triggers (if applicable): n/a  Cognition: concrete  Progress: Moderate  Response: Patient had positive interactions with others and was able to maintain a regulated state throughout group.   Plan: to continue treatment

## 2021-10-09 NOTE — Behavioral Health Treatment Team
TREATMENT TEAM NOTE  Name: [name]          MRN: [id number]              DOB: [birth year]          Age: 26 y.o.  Admission Date: 09/23/2021                 Attendees:   Psychiatrist: Arnette Norris, DO  Nurse Supervisor: Cyndia Skeeters, RN, BSN  Nurse: Lucius Conn, RN  Pharmacist: Renee Pain, PharmD  Therapist: Dewayne Hatch, [place], [place]  Therapist: Neita Carp, [place]  Case Manager: Carmela Rima, LMSW  Case Manager: Tinnie Gens, LMSW  Utilization Review: Areatha Keas, RN      Significant Points Discussed: Denies psych. AVH is 'discipline others'. Asking to leave. Disrobing in room while female tech present. Later AH of brother crying. Responding to internal stimuli throughout night.      Treatment Plan Discussion: Continue treatment. Discuss switch to Clozaril with ftr.      Projected Discharge Date: TBD

## 2021-10-09 NOTE — Progress Notes
Psychiatry Progress Note  Name: Kristin Maynard          MRN: [id number] DOB: 1996/01/03         Age: 26 y.o.  Admission Date: 09/23/2021  LOS: 16 days    Service: [facility]    Principal Problem:    Schizophrenia (HCC)  Active Problems:    Pituitary macroadenoma (HCC)    Hyperprolactinemia (HCC)    Elevated BP without diagnosis of hypertension    Sinus tachycardia    Leukocytosis    PLAN  1. Continue admission to red zone for disorganization and behaviors that are minimally redirectable and not appropriate for the general milieu at this time?  2. Change Ativan to 1mg  BID with dosing qam and 1500  3. Increase Seroquel to 800mg  qhs  4. Increase ambien to 10mg  nightly for insomnia  5. Continue other medications as currently prescribed  6. Patient is voluntary by DPOA as she lacks decision making capacity at this time    ?   REASONS FOR CONTINUED HOSPITALIZATION  Psychiatric stabilization and medication management. Patient is high risk for re-admission if discharged at this time       ______________________________________________________________  SUBJECTIVE  Kristin Maynard was seen in the red zone where she was looking out the window responding to stimuli and snapping her fingers. She stated that she was good today. She reported nightmares of demons. AH have improved. VH she denies at this time but has been having them at night. She denies SI/HI. When discussing HI she stating having thoughts of disciplining others.     She was seen later in the hallway where she asked to leave stating that if she didn't leave here she was going to die and that also others would die if she didn't leave. She was pacing the hallways and irritable.     Overnight she slept 3 hrs. She has demonstrated some inappropriate behaviors undressing self in red zone in main area and also experienced nausea/vomiting this morning    I contacted the patient's father who visited last night with multiple family members and they all agreed Kristin Maynard is doing better. She was appropriately talkative and seemed more like herself. Prior physicians have not discussed clozaril with him and have mostly discussed Abilify for medications.     REVIEW OF SYSTEMS  Review of Systems   Constitutional: Negative for chills and fever.   Respiratory: Negative for shortness of breath.    Cardiovascular: Negative for chest pain.   Gastrointestinal: Positive for nausea and vomiting. Negative for diarrhea.   Skin: Negative for rash.   Neurological: Negative for dizziness, light-headedness and headaches.     ______________________________________________________________  OBJECTIVE                 Vital Signs:  Current                Vital Signs: 24 Hour Range   BP: 142/94 (03/26 1950)  Temp: 36.3 ?C (97.3 ?F) (03/26 1950)  Pulse: 127 (03/26 1950)  Respirations: 20 PER MINUTE (03/26 1950)  SpO2: 98 % (03/26 1950)  O2 Device: None (Room air) (03/26 1950) BP: (142)/(94)   Temp:  [36.3 ?C (97.3 ?F)]   Pulse:  [127]   Respirations:  [20 PER MINUTE]   SpO2:  [98 %]   O2 Device: None (Room air)   Intensity Pain Scale (Self Report): (not recorded)      Scheduled Medications:  cabergoline (DOSTINEX) (+) tablet 0.5 mg **PATIENT OWN  MED**, 0.5 mg, Oral, Once per day on Mon Thu  levonorgestrel-ethinyl estradiol (AVIANE-28, LESSINA-28) tablet 1 tablet, 1 tablet, Oral, QDAY  lidocaine (LIDODERM) 5 % topical patch 1 patch, 1 patch, Topical, QDAY  LORazepam (ATIVAN) tablet 1 mg, 1 mg, Oral, BID  melatonin tablet 5 mg, 5 mg, Oral, QHS  metoprolol tartrate tablet 25 mg, 25 mg, Oral, QDAY  OLANZapine (ZYPREXA ZYDIS) rapid dissolve tablet 5 mg, 5 mg, Oral, ONCE  QUEtiapine (SEROquel) tablet 800 mg, 800 mg, Oral, QHS  traZODone (DESYREL) tablet 100 mg, 100 mg, Oral, QHS        PRN Medications:  acetaminophen Q6H PRN 650 mg at 10/03/21 0812, calcium carbonate TID PRN, hydrOXYzine Q6H PRN 25 mg at 10/04/21 2235, QUEtiapine Q6H PRN **OR** OLANZapine Q6H PRN, ondansetron Q6H PRN 4 mg at 10/08/21 1717, polyethylene glycol 3350 QDAY PRN, senna/docusate QDAY PRN, zolpidem QHS PRN    Mental status examination:  ? General/Constitutional: appears stated age, dressed in hospital clothes, fair grooming  ? Eye Contact: intense  ? Behavior: mostly calm, cooperative; appropriate for conversation  ? Speech: RRR with normal volume and tone. Good articulation  ? Mood: Good  ? Affect: restricted; mood congruent  ? Thought Process: tangential at times  ? Thought Content: Denies SI, HI. No evidence of delusions. Endorses [place]  ? Perception: Denies AVH. Appears to be responding to stimuli at times   ? Associations: Intact  ? Insight/Judgment: impaired/impaired    Physical Exam:  ? Neuro: Gait Normal  ? Musculoskeletal: Moves all four extremities spontaneously       ______________________________________________________________  [name], DO

## 2021-10-09 NOTE — Care Plan
Problem: Mood - Altered  Goal: Stabilize mood  Flowsheets (Taken 10/08/2021 2102)  Stabilize mood:   Assess depressive symptoms   Assess coping style   Establish therapeutic relationships   Assess ineffective coping signs and symptoms     Problem: Harm to self, high risk of suicide  Goal: Absence of Harm to Self  Outcome: Goal Ongoing  Flowsheets (Taken 10/08/2021 2102)  Absence of harm to self:   Assess and report significant changes in mood and affect   Maintain a safe environment   Reassess for suicide risk daily     Problem: Elopement Prevention  Goal: Patients without Decisional Capacity will not elope from the care setting  Outcome: Goal Ongoing

## 2021-10-10 NOTE — Behavioral Health Treatment Team
TREATMENT TEAM NOTE  Name: [name]          MRN: [id number]              DOB: June 11, 1996          Age: 26 y.o.  Admission Date: 09/23/2021                 Attendees:   Psychiatrist: Arnette Norris, DO  Psychiatrist: Alfredia Ferguson, APRN  Nurse Supervisor: Cyndia Skeeters, RN, BSN  Nurse: Lucius Conn, RN  Pharmacist: Kathreen Cornfield, PharmD  Therapist: Dewayne Hatch, [place], [place]  Therapist: Neita Carp, [place]  Case Manager: Carmela Rima, LMSW  Case Manager: Tinnie Gens, LMSW  Case Manager: April Cordero, [place]  Utilization Review: Areatha Keas, RN        Significant Points Discussed: anxiety=3, depression=2; endorsing AVH and delusions; had a small outburst due to not being able to leave; appears to be responding to internal stimuli but no issues; slept 3 hours       Treatment Plan Discussion: will increase Seroquel; possibly going to double Ambien       Projected Discharge Date: TBD

## 2021-10-10 NOTE — Group Note
Name: Kristin Maynard   MRN: [id number]     DOB: [birth year]      Age: 26 y.o.  Admission Date: 09/23/2021     LOS: 17 days     Date of Service: 10/10/2021      Group Topic: BH Stress Management  Group Date: 10/10/2021  Start Time: 0915  End Time: 1000  Facilitators: Garner Nash          Number of Participants: 10  Group Focus: affirmation, check in, communication, concentration, coping skills, feeling awareness/expression, leisure skills, music therapy, other stress management, self-awareness, and social skills  Treatment Modality: Music Therapy  Interventions utilized were other Engineer, mining; Dispensing optician; Meditation  Purpose: enhance coping skills, express feelings, improve communication skills, reinforce self-care, and enhance stress management    Lyric Analysis: The therapist facilitated a group discussion about the song, Stressed Out by [employer]. The group discussed how they connected with the lyrics, themes, and musical elements within the song. The group then transitioned into a discussion about stress physically manifesting within the body.  Stress/Tension Figures: The patients were provided with body outlines and instructed to mark within these outlines were they feel they store their tensions. Patients discussed these outlines and then transitioned into discussion about coping with these stressors.  Meditation: The patients completed a guided meditation focused on stillness.       Name: Kristin Maynard Date of Birth: 02/16/1996   MR: [id number]      Level of Participation: active   Quality of Participation: attentive, cooperative and engaged  Interactions with others: gave feedback  Mood/Affect: appropriate  Cognition: coherent/clear  Progress: Moderate  Response: The patient actively participated within the group. The patient was attentive and engaged within group discussions. The patient gave insightful comments during discussions and interacted appropriately throughout the group.  Plan: to continue treatment

## 2021-10-10 NOTE — Care Plan
Problem: Harm to self, high risk of suicide  Goal: Absence of Harm to Self  Outcome: Goal Ongoing     Problem: Mood - Altered  Goal: Stabilize mood  Outcome: Goal Ongoing     Problem: Self-esteem - Low  Goal: Demonstration of positive self-esteem  Outcome: Goal Ongoing     Problem: Thought Process - Altered  Goal: Demonstration of organized thought processes  Outcome: Goal Ongoing

## 2021-10-10 NOTE — Group Note
Name: Kristin Maynard   MRN: [id number]     DOB: [birth year]      Age: 26 y.o.  Admission Date: 09/23/2021     LOS: 17 days     Date of Service: 10/10/2021      Group Topic: BH Self-Care  Group Date: 10/10/2021  Start Time: 1250  End Time: 1330  Facilitators: Earl Lites          Number of Participants: 7  Group Focus: leisure skills, self-awareness, and self-esteem  Treatment Modality: Recreation Therapy  Interventions utilized were leisure development and mental fitness  Purpose: increase insight, regain self-worth, and reinforce self-care    Patients exercised to music. Patients filled out a work sheet about what they do when they feel healthy and want to become healthy. Patients wrote down what social activities, goals, social support they have, what they do for self-care, what exercises they perform and activities that are pleasant for them. Patients shared all or part of their sheet with the group.      Name: Kristin Maynard Date of Birth: 24-Nov-1995   MR: [id number]      Patient did not attend groups.  Plan: to continue treatment

## 2021-10-10 NOTE — Group Note
Name: Annemarie Sebree   MRN: [id number]     DOB: 05-01-1996      Age: 26 y.o.  Admission Date: 09/23/2021     LOS: 17 days     Date of Service: 10/10/2021      Group Topic: BH Coping Skills  Group Date: 10/10/2021  Start Time: 1100  End Time: 1200  Facilitators: Corrissa Martello          Number of Participants: 9  Group Focus: coping skills and personal responsibility  Treatment Modality: Dialectical Behavioral Therapy  Interventions utilized were clarification, exploration, patient education, problem solving, and support  Purpose: enhance coping skills and increase insight    "ACCEPTS." Group members provided with handout on ACCEPTS, a DBT skill used to identify coping skills to be utilized during times of distress and emotional dysregulation.          Name: Maelys Date of Birth: [birth year]   MR: [id number]      Level of Participation: minimal   Quality of Participation: attentive, cooperative, offered feedback and quiet  Interactions with others: gave feedback  Mood/Affect: appropriate and brightens with interaction  Triggers (if applicable): N/A  Cognition: confused  Progress: Gaining insight  Response: Patient present for group, and added some input. She appeared confused at times, but asked for clarification.  Plan: to continue treatment

## 2021-10-10 NOTE — Progress Notes
Psychiatry Progress Note  Name: Kristin Maynard          MRN: [id number] DOB: 1995/09/05         Age: 26 y.o.  Admission Date: 09/23/2021  LOS: 17 days    Service: [facility]    Principal Problem:    Schizophrenia (HCC)  Active Problems:    Pituitary macroadenoma (HCC)    Hyperprolactinemia (HCC)    Elevated BP without diagnosis of hypertension    Sinus tachycardia    Leukocytosis    PLAN  1. Continue admission to red zone for disorganization and behaviors with hopes to transition out of red zone later this week  2. Continue Ativan 1mg  BID with dosing qam and 1500 with plans to taper as tolerated   3. Continue Seroquel 800mg  qhs (increased on 3/27)  4. Continue ambien 10mg  nightly for insomnia (increased on 3/27)  5. Continue other medications as currently prescribed  ?   REASONS FOR CONTINUED HOSPITALIZATION  Psychiatric stabilization and medication management. Patient is high risk for re-admission if discharged at this time       ______________________________________________________________  SUBJECTIVE  Kristin Maynard was seen in the red zone where she was was calmly talking with her nurse.  Appetite has been good. Last BM was yesterday. They had been enjoying singing Disney songs throughout the morning and Kristin Maynard told me about her time on choir.  She reported having some difficulty staying asleep last night.  She does not feel as cloudy headed as she has in the past on other medications.  She reports hearing hallucinations that involve crying but none currently.  She denies visual hallucinations.  When asked about suicidal thoughts she stated not as much.  She denies HI.     Kristin Maynard has been calm and cooperative. She did have an episode yesterday requiring PRN seroquel which was effective and she was redirectable. She slept a total of 3 hrs last night.     REVIEW OF SYSTEMS  Review of Systems   Constitutional: Negative for chills and fever.   Respiratory: Negative for shortness of breath.    Cardiovascular: Negative for chest pain.   Gastrointestinal: Negative for constipation, diarrhea, nausea and vomiting.   Skin: Negative for rash.   Neurological: Negative for dizziness, light-headedness and headaches.     ______________________________________________________________  OBJECTIVE                 Vital Signs:  Current                Vital Signs: 24 Hour Range   BP: 138/96 (03/28 0700)  Temp: 36.2 ?C (97.1 ?F) (03/28 0700)  Pulse: 112 (03/28 0700)  Respirations: 18 PER MINUTE (03/28 0700)  SpO2: 96 % (03/28 0700) BP: (133-138)/(91-96)   Temp:  [36.2 ?C (97.1 ?F)-36.2 ?C (97.2 ?F)]   Pulse:  [112-117]   Respirations:  [18 PER MINUTE-20 PER MINUTE]   SpO2:  [96 %-97 %]    Intensity Pain Scale (Self Report): (not recorded)      Scheduled Medications:  cabergoline (DOSTINEX) (+) tablet 0.5 mg **PATIENT OWN MED**, 0.5 mg, Oral, Once per day on Mon Thu  levonorgestrel-ethinyl estradiol (AVIANE-28, LESSINA-28) tablet 1 tablet, 1 tablet, Oral, QDAY  lidocaine (LIDODERM) 5 % topical patch 1 patch, 1 patch, Topical, QDAY  LORazepam (ATIVAN) tablet 1 mg, 1 mg, Oral, BID  melatonin tablet 5 mg, 5 mg, Oral, QHS  metoprolol tartrate tablet 25 mg, 25 mg, Oral, QDAY  OLANZapine (ZYPREXA  ZYDIS) rapid dissolve tablet 5 mg, 5 mg, Oral, ONCE  QUEtiapine (SEROquel) tablet 800 mg, 800 mg, Oral, QHS  traZODone (DESYREL) tablet 100 mg, 100 mg, Oral, QHS        PRN Medications:  acetaminophen Q6H PRN 650 mg at 10/03/21 0812, calcium carbonate TID PRN, hydrOXYzine Q6H PRN 25 mg at 10/04/21 2235, QUEtiapine Q6H PRN **OR** OLANZapine Q6H PRN, ondansetron Q6H PRN 4 mg at 10/08/21 1717, polyethylene glycol 3350 QDAY PRN, senna/docusate QDAY PRN, zolpidem QHS PRN 10 mg at 10/09/21 2000    MENTAL STATUS EXAMINATION  General/Constitutional: appears stated age, dressed appropriately, good hygiene   Eye Contact: good  Behavior: Calm, cooperative; appropriate for conversation  Speech: RRR with normal volume and tone. Good articulation  Mood: good  Affect: restricted; mood congruent  Thought Process: Linear and goal directed  Thought Content: endorses passive SI, denies HI. Delusions of dying in the hospital   Perception: Denies AVH. Reports recent AH of crying   Associations: Intact  Insight/Judgment: improved/improved      Physical Exam:  Gait: Ambulates without difficulty. Normal arm swing       ______________________________________________________________  [name], DO

## 2021-10-10 NOTE — Group Note
Name: Kristin Maynard   MRN: [id number]     DOB: 01/12/1996      Age: 26 y.o.  Admission Date: 09/23/2021     LOS: 17 days     Date of Service: 10/10/2021      Group Topic: [place] Leisure Activities  Group Date: 10/09/2021  Start Time: 1245  End Time: 1345  Facilitators: Candie Chroman          Number of Participants: 6  Group Focus: coping skills  Treatment Modality: Art Therapy  Interventions utilized were assignment and exploration  Purpose: enhance coping skills, express feelings, and reinforce self-care          Name: Carlena Date of Birth: [birth year]   MR: [id number]      Level of Participation: patient not present for group   Plan: to continue treatment

## 2021-10-10 NOTE — Group Note
Name: Kristin Maynard   MRN: [id number]     DOB: [birth year]      Age: 26 y.o.  Admission Date: 09/23/2021     LOS: 17 days     Date of Service: 10/10/2021      Group Topic: BH Personal Values  Group Date: 10/10/2021  Start Time: 4174  End Time: 1100  Facilitators: Candie Chroman          Number of Participants: 10  Group Focus: feeling awareness/expression and self-awareness  Treatment Modality: Art Therapy  Interventions utilized were assignment and exploration  Purpose: enhance coping skills, express feelings, and regain self-worth    Group members were encouraged to use line, shape, and color to represent their personal values.           Name: Kristin Maynard Date of Birth: [birth year]   MR: [id number]      Level of Participation: active   Quality of Participation: attentive, cooperative and engaged  Interactions with others: gave feedback  Mood/Affect: depressed and flat  Triggers (if applicable): NA  Cognition: fearful  Progress: Moderate  Response: Patient's artwork conveyed belief "that something bad is going to happen to me if I am here in 4 days."  Plan: to continue treatment

## 2021-10-10 NOTE — Progress Notes
PRN Medication Administered:  Ambien 10 mg    Non pharmacological interventions attempted prior to PRN medication administration:  Decrease stimuli, reality orientation    Brief narrative of reason for PRN medication administration:   Pt actively responding to internal stimuli, requests PRN to help her sleep tonight.

## 2021-10-10 NOTE — Care Plan
Problem: Elopement Prevention  Goal: Patients without Decisional Capacity will not elope from the care setting  Outcome: Goal Ongoing     Problem: Mood - Altered  Goal: Stabilize mood  Flowsheets (Taken 10/09/2021 2130)  Stabilize mood:   Assess ineffective coping signs and symptoms   Assess depressive symptoms   Establish therapeutic relationships   Assess coping style     Problem: Thought Process - Altered  Goal: Demonstration of organized thought processes  Flowsheets (Taken 10/09/2021 2130)  Demonstration of organized thought processes:   Assess cognitive ability   Assess thought process   Assess disturbed thought process signs and symptoms   Assess risk for impaired cognition   Assess in reality orientation   Provide environmental regulation - agitation

## 2021-10-11 MED ORDER — TEMAZEPAM 7.5 MG PO CAP
15 mg | Freq: Every evening | ORAL | 0 refills | Status: DC
Start: 2021-10-11 — End: 2021-10-17
  Administered 2021-10-12 – 2021-10-17 (×6): 15 mg via ORAL

## 2021-10-11 MED ORDER — LORAZEPAM 0.5 MG PO TAB
.5 mg | Freq: Two times a day (BID) | ORAL | 0 refills | Status: DC
Start: 2021-10-11 — End: 2021-10-17
  Administered 2021-10-11 – 2021-10-17 (×12): 0.5 mg via ORAL

## 2021-10-11 NOTE — Care Plan
Problem: Mood - Altered  Goal: Stabilize mood  Flowsheets (Taken 10/10/2021 2107)  Stabilize mood:   Assess depressive symptoms   Assess coping style   Establish therapeutic relationships   Assess ineffective coping signs and symptoms

## 2021-10-11 NOTE — Group Note
Name: Kristin Maynard   MRN: [id number]     DOB: 20-Jul-1995      Age: 26 y.o.  Admission Date: 09/23/2021     LOS: 18 days     Date of Service: 10/11/2021      Group Topic: [place] Leisure Activities  Group Date: 10/11/2021  Start Time: 1015  End Time: 1100  Facilitators: Harrell Lark          Number of Participants: 11  Group Focus: communication, coping skills, feeling awareness/expression, healthy friendships, leisure skills, music therapy, relaxation, reminiscence, and social skills  Treatment Modality: Music Therapy  Interventions utilized were other Name That Tune; Music Listening  Purpose: enhance coping skills, express feelings, improve communication skills, reinforce self-care, and enhance leisure skills    Name That Tune: The therapist played a specific song and gave patients the opportunity to listen to a brief amount before guessing the song title/artist. Patients then shared any connections or thoughts regarding the song before continuing on to the next song. The group transitioned into music listening.  Music Listening: The therapist brought in a speaker for clients to request specific songs. The purpose of this intervention was to enhance leisure skills, coping skills, and self-care.    Name: [name] Date of Birth: 17-Jan-1996   MR: [id number]      Level of Participation: active   Quality of Participation: attentive, cooperative, distractible and engaged  Interactions with others: gave feedback  Mood/Affect: flat  Cognition: coherent/clear and responding to internal stimuli  Progress: Moderate  Response: The patient actively participated within the group. The patient sang along to familiar songs and interacted throughout interventions. Throughout the group, the patient was observed responding to internal stimuli and interacting with unseen others. Patient was easily redirected towards group with directed question to engage.  Plan: to continue treatment

## 2021-10-11 NOTE — Group Note
Name: Kameko Hukill   MRN: [id number]     DOB: 1996-02-17      Age: 26 y.o.  Admission Date: 09/23/2021     LOS: 18 days     Date of Service: 10/11/2021      Group Topic: [place] Leisure Activities  Group Date: 10/11/2021  Start Time: 0915  End Time: 1000  Facilitators: Earl Lites          Number of Participants: 9  Group Focus: coping skills, leisure skills, self-awareness, self-esteem, and social skills  Treatment Modality: Recreation Therapy  Interventions utilized were group exercise and leisure development  Purpose: enhance coping skills, improve communication skills, and reinforce self-care    Patients exercised to music. The patients were given a paper with several leisure interest written on it and they found peers who liked to do those activities and put their name next to the activity. Patients then played cards and listened to the radio. Patients socialized and helped one another with the game.      Name: Menucha Date of Birth: [birth year]   MR: [id number]      Level of Participation: active   Quality of Participation: attentive, cooperative and engaged  Interactions with others: gave feedback  Mood/Affect: appropriate  Cognition: goal directed  Progress: Moderate, patient had a good group. Patient talked, smiled and enjoyed playing cards with her group.  Plan: to continue treatment

## 2021-10-11 NOTE — Care Coordination-Inpatient
Care Coordination - Inpatient Note      Patient Name:   Kristin Maynard                        MRN:  [id number]  Admission Date:  09/23/2021    10:33-10:38am  CM spoke with Kristin Maynard at the [facility] in [place] [phone]) to coordinate patient's aftercare services. Patient is scheduled on 10/25/21 @ 2pm for therapy with Kristin Maynard, which will be in-person. Patient is scheduled for a medication appointment on 10/20/21 @ 9:20am with Kristin Maynard, via Zoom. Kristin Maynard asked for patient's email address. CM to follow-up and will call back with patient's email.     11:29-11:32am  CM spoke with patient's father/DPOA, Kristin Maynard [phone]), to discuss possible discharge on Thursday. Per Kristin Maynard, "If you all feel she is ready, then that's fine". CM provided Kristin Maynard with a brief update on outpatient services, and also asked for his email to provide to the [employer]. Kristin Maynard states he is going to call patient's brother, Kristin Maynard, to coordinate transportation, and will call this Probation officer back. CM to follow-up as needed.     2:26pm  CM attempted to contact Kristin Maynard in [place] [phone]) to provide patient's email. No answer. CM left voicemail with return callback info. Will try again later.     3:41-3:44pm  CM spoke with patient's father/DPOA, Kristin Maynard [phone]), to discuss transportation. Kristin Maynard states he will be here around 1pm for pickup. CM to inform patient and will follow-up as needed.     3:45-3:53pm  CM spoke with Kristin Maynard at the [facility] in [place] [phone]) to provide patient's email. Kristin Maynard states they had to reschedule patient's therapy appointment with a new provider. Patient will meet with Kristin Maynard for therapy on 10/25/21 @ 1:45pm. CM to add to patient's discharge paperwork.      Carmela Rima  10/11/2021

## 2021-10-11 NOTE — Progress Notes
PRN Medication Administered:  Ambien 10 mg    Non pharmacological interventions attempted prior to PRN medication administration:  Decrease stimuli    Brief narrative of reason for PRN medication administration:   Pt requested PRN medication to help her sleep.

## 2021-10-11 NOTE — Care Plan
Problem: Harm to self, high risk of suicide  Goal: Absence of Harm to Self  Outcome: Goal Ongoing     Problem: High Fall Risk  Goal: High Fall Risk  Outcome: Goal Ongoing     Problem: Mood - Altered  Goal: Stabilize mood  Outcome: Goal Ongoing  Goal: Knowledge of Altered Mood  Outcome: Goal Ongoing     Problem: Violence, self/other-directed, Risk of  Goal: Absence of violence  Outcome: Goal Ongoing  Goal: Knowledge of risk for violence, self/other-directed  Outcome: Goal Ongoing

## 2021-10-11 NOTE — Group Note
Name: Rainy Rothman   MRN: [id number]     DOB: January 08, 1996      Age: 26 y.o.  Admission Date: 09/23/2021     LOS: 18 days     Date of Service: 10/11/2021      Group Topic: [place] Leisure Activities  Group Date: 10/11/2021  Start Time: 1300  End Time: Quail  Facilitators: Candie Chroman          Number of Participants: 11  Group Focus: coping skills  Treatment Modality: Art Therapy  Interventions utilized were assignment and exploration  Purpose: enhance coping skills, express feelings, and reinforce self-care      Group members were provided with a variety of papers, glue sticks, and oil pastels and encouraged to create an abstract collage about something they enjoy. Activity is intended to promote creative expression, problem solving, coping skills, and healthy leisure interests.         Name: Kiora Date of Birth: [birth year]   MR: [id number]      Level of Participation: patient not present for group   Plan: to continue treatment

## 2021-10-11 NOTE — Progress Notes
Psychiatry Progress Note  Name: Kristin Maynard          MRN: [id number] DOB: 1995/11/15         Age: 26 y.o.  Admission Date: 09/23/2021  LOS: 18 days    Service: [facility] Adult Psych A    Principal Problem:    Schizophrenia (HCC)  Active Problems:    Pituitary macroadenoma (HCC)    Hyperprolactinemia (HCC)    Elevated BP without diagnosis of hypertension    Sinus tachycardia    Leukocytosis    PLAN  1. Transition patient from red zone to standard milieu   2. Decrease Ativan to 0.5mg  BID   3. Discontinue ambien due to lack of efficacy   4. Initiate Temazepam 15mg  qhs for insomnia   5. Continue other medications as currently prescribed  ?   REASONS FOR CONTINUED HOSPITALIZATION  Psychiatric stabilization and medication management. Patient is high risk for re-admission if discharged at this time       ______________________________________________________________  Kristin Maynard was seen in the consult room after she was pulled from group where she was participating.  She reported that she had better sleep last night but continues to have trouble staying asleep.  She woke up with a nightmare and was startled and began screaming this morning.  She felt like she was having pain in her legs and needed to ask what time it was.  She feels like once it becomes 7:00 in the morning she is safe.  She also feels like she must shower to wash away the sins of others.  She is able to think through this and states that it does not make sense but it continues to be a concern for her.  She denies SI/HI.  She reports auditory hallucinations that have decreased and are not as bothersome to her.  She reports that his visual hallucinations of reepers. She had a good visit with her mom yesterday.     She took [place] last night and slept for 3 hrs. She had a good day yesterday attending groups and meals. She continues to respond to stimuli but not in an aggressive manner.     REVIEW OF SYSTEMS  Review of Systems   Constitutional: Negative for chills and fever.   Respiratory: Negative for shortness of breath.    Cardiovascular: Negative for chest pain.   Gastrointestinal: Negative for constipation, diarrhea, nausea and vomiting.   Skin: Negative for rash.   Neurological: Negative for dizziness, light-headedness and headaches.   Psychiatric/Behavioral: Positive for hallucinations and sleep disturbance. Negative for suicidal ideas.     ______________________________________________________________  OBJECTIVE                 Vital Signs:  Current                Vital Signs: 24 Hour Range   BP: 130/82 (03/29 0700)  Temp: 36.2 ?C (97.1 ?F) (03/29 0700)  Pulse: 111 (03/29 0700)  Respirations: 15 PER MINUTE (03/29 0700)  SpO2: 99 % (03/29 0700) BP: (130-140)/(82-91)   Temp:  [36.2 ?C (97.1 ?F)-36.7 ?C (98.1 ?F)]   Pulse:  [111-115]   Respirations:  [15 PER MINUTE-18 PER MINUTE]   SpO2:  [97 %-99 %]    Intensity Pain Scale (Self Report): (not recorded)      Scheduled Medications:  cabergoline (DOSTINEX) (+) tablet 0.5 mg **PATIENT OWN MED**, 0.5 mg, Oral, Once per day on Mon Thu  levonorgestrel-ethinyl estradiol (AVIANE-28, LESSINA-28) tablet 1 tablet,  1 tablet, Oral, QDAY  lidocaine (LIDODERM) 5 % topical patch 1 patch, 1 patch, Topical, QDAY  LORazepam (ATIVAN) tablet 0.5 mg, 0.5 mg, Oral, BID  melatonin tablet 5 mg, 5 mg, Oral, QHS  metoprolol tartrate tablet 25 mg, 25 mg, Oral, QDAY  QUEtiapine (SEROquel) tablet 800 mg, 800 mg, Oral, QHS  temazepam (RESTORIL) capsule 15 mg, 15 mg, Oral, QHS  traZODone (DESYREL) tablet 100 mg, 100 mg, Oral, QHS        PRN Medications:  acetaminophen Q6H PRN 650 mg at 10/03/21 0812, calcium carbonate TID PRN, hydrOXYzine Q6H PRN 25 mg at 10/04/21 2235, QUEtiapine Q6H PRN **OR** OLANZapine Q6H PRN, ondansetron Q6H PRN 4 mg at 10/08/21 1717, polyethylene glycol 3350 QDAY PRN, senna/docusate QDAY PRN    MENTAL STATUS EXAMINATION  General/Constitutional: appears stated age, dressed appropriately, good hygiene   Eye Contact: good  Behavior: Calm, cooperative; appropriate for conversation  Speech: RRR with normal volume and tone. Good articulation  Mood: good  Affect: brightened; mood congruent  Thought Process: Linear and organized   Thought Content: denies SI, HI. Continued delusions involving others being harmed   Perception: Endorses AH and recent VH   Associations: Intact  Insight/Judgment: fair/fair    Physical Exam:  Gait: Ambulates without difficulty. Normal arm swing       ______________________________________________________________  [name], DO

## 2021-10-11 NOTE — Progress Notes
Case Management Progress Note    NAME:Kristin Maynard MRN: [id number] DOB:[birth year] AGE: 26 y.o.  ADMISSION DATE: 09/23/2021 DAYS ADMITTED: LOS: 18 days     Date of Service: 10/11/2021    11:09am  CM attempted to meet with patient to discuss case management needs. Patient was in therapy group. CM to try again later.     2:04-2:07pm  CM met with patient on the unit to discuss case management needs. CM and patient discussed patient discharging tomorrow. Patient verbalized understanding and asked if she needed to call her dad to schedule transportation. CM informed patient that this writer is waiting to hear back from her dad about transportation. CM also informed patient about her upcoming appointments at the [facility]. Patient reports no additional questions/concerns. CM to meet with patient at a later time to complete safety plan.

## 2021-10-11 NOTE — Progress Notes
Brief IM Chart Review Note:    Patient's pulse in 110s. BP 130/82 today and stable.     Recommendations:  - encourage oral hydration and good PO intake  - it appears that tachycardia could be reflex from poor oral intake vs agitation/anxiety in setting of AVH vs possible inappropriate sinus tachycardia vs. Medication induced. Patient is hemodynamically stable overall and asymptomatic. Continue metoprolol and continue on discharge.   - Patient will likely benefit from cardiac evaluation if tachycardia ongoing as acute psychiatric illness stabilizes.    Will sign off. If there are any concerns that arise, please reach out to IM.     Kristopher Oppenheim  Internal Medicine Hospitalist  Available via Voalte    Please use the Med Private First Call for all patient-related communications. Personal Voaltes and pagers are not answered at all hours.

## 2021-10-11 NOTE — Behavioral Health Treatment Team
TREATMENT TEAM NOTE  Name: Kristin Maynard          MRN: [id number]              DOB: 03/05/1996          Age: 26 y.o.  Admission Date: 09/23/2021                 Attendees:   Psychiatrist: Arnette Norris, DO  Nurse Supervisor: Cyndia Skeeters, RN, BSN  Nurse: Lucius Conn, RN  Pharmacist: Renee Pain, PharmD  Therapist: Dewayne Hatch, [place], [place] [facility]  Case Manager: Carmela Rima, LMSW  Case Manager: Tinnie Gens, LMSW  Case Manager: April Cordero, [place]  Utilization Review: Areatha Keas, RN    Significant Points Discussed: Good day yesterday. Went to groups and meals. Occasional internal stimuli last HS. Slept 3 hours.      Treatment Plan Discussion: Continue treatment.      Projected Discharge Date: Possible Friday.

## 2021-10-11 NOTE — Group Note
Name: Kristin Maynard   MRN: [id number]     DOB: 02-Apr-1996      Age: 26 y.o.  Admission Date: 09/23/2021     LOS: 18 days     Date of Service: 10/11/2021      Group Topic: BH Cognitive Distortions  Group Date: 10/11/2021  Start Time: 1100  End Time: 1155  Facilitators: Jacquelin Hawking          Number of Participants: 11  Group Focus: clarity of thought and self-awareness  Treatment Modality: Cognitive Behavioral Therapy  Interventions utilized were confrontation and group exercise  Purpose: explore maladaptive thinking          Name: Kristin Maynard Date of Birth: 02/11/1996   MR: [id number]      Level of Participation: active   Quality of Participation: attentive and cooperative  Interactions with others: did not interact  Mood/Affect: agitated  Triggers (if applicable): N/A  Cognition: coherent/clear  Progress: Gaining insight  Response: Patient reported that it was difficult for her to describe how she is feeling. She stated that it is close to annoyance.  Patient asked several clarifying questions about the individual cognitive restructuring activity.   Plan: to continue treatment

## 2021-10-12 MED ORDER — QUETIAPINE 200 MG PO TAB
1000 mg | Freq: Every evening | ORAL | 0 refills | Status: DC
Start: 2021-10-12 — End: 2021-10-13
  Administered 2021-10-13: 02:00:00 1000 mg via ORAL

## 2021-10-12 MED ORDER — PRAZOSIN 1 MG PO CAP
1 mg | Freq: Every evening | ORAL | 0 refills | Status: DC
Start: 2021-10-12 — End: 2021-10-13
  Administered 2021-10-13: 02:00:00 1 mg via ORAL

## 2021-10-12 NOTE — Group Note
Name: Kristin Maynard   MRN: [id number]     DOB: May 07, 1996      Age: 26 y.o.  Admission Date: 09/23/2021     LOS: 19 days     Date of Service: 10/12/2021      Group Topic: BH What is Mental Health and Mental Illness?  Group Date: 10/12/2021  Start Time: 1100  End Time: 1155  Facilitators: Dewayne Hatch          Number of Participants: 13  Group Focus: acceptance, coping skills, medication education, personal responsibility, and self-awareness  Treatment Modality: Patient-Centered Therapy and Psychoeducation  Interventions utilized were clarification, exploration, patient education, and problem solving  Purpose: enhance coping skills, explore maladaptive thinking, increase insight, regain self-worth, and reinforce self-care    Provider asked group for topics to discuss during group that relate to their mental health or mental health as a larger topic. Provider lead group in discussion of the following topics:   Stigma of Mental Illness   Medications   Coping Skills          Name: Kristin Maynard Date of Birth: [birth year]   MR: [id number]      Level of Participation: active   Quality of Participation: attentive and engaged  Interactions with others: gave feedback  Mood/Affect: appropriate  Triggers (if applicable): None  Cognition: coherent/clear and concrete  Progress: Moderate  Response: Present. Pt asks if medications are life-long.  Plan: to continue treatment

## 2021-10-12 NOTE — Progress Notes
PRN Medication Administered:  Atarax '25mg'$  po  Seroquel '100mg'$  po  Non pharmacological interventions attempted prior to PRN medication administration:  Verbal de-escalation techniques, Decrease stimuli and Increase structure and limit setting    Brief narrative of reason for PRN medication administration: Pt was told she would not be going home today. Pt endorses in delusional thinking worrying that if she does not go home by Friday, that people would die. Pt internally agitated pacing around unit responding to internal stimuli. Endorses in both anxiety and agitation at this time.

## 2021-10-12 NOTE — Progress Notes
Psychiatry Progress Note  Name: Kristin Maynard          MRN: [id number] DOB: [birth year]         Age: 26 y.o.  Admission Date: 09/23/2021  LOS: 19 days    Service: [facility] Adult Psych A    Principal Problem:    Schizophrenia (HCC)  Active Problems:    Pituitary macroadenoma (HCC)    Hyperprolactinemia (HCC)    Elevated BP without diagnosis of hypertension    Sinus tachycardia    Leukocytosis    PLAN  1. Continue admission for further stabilization   2. Continue temazepam 15mg  qhs (started 3/29)  3. Increase Seroquel to 1000mg  qhs for psychosis   4. Initiate prazosin 1mg  for nightmares with plans to titrate up as tolerated/indicated  5. Continue cabergoline 2.5mg  twice weekly  6. Continue Ativan 0.5mg  BID with plans to taper as able   7. Continue other medications as currently prescribed  ?   REASONS FOR CONTINUED HOSPITALIZATION  Psychiatric stabilization and medication management. Patient is high risk for re-admission if discharged at this time. Patient is medically complex and has required multiple recent admissions. At this time she is unsafe to return home due to ongoing delusions that others need to be saved. She continues to require redirection and sleep has minimally improved in spite of multiple medication adjustments.      ______________________________________________________________  Kristin Maynard was seen in the consult room where she stated that the rest of her day was pretty good until the evening.  She has a feeling that everybody is treating and has been in recent memories.  She describes having a new Steriload that has been born as a Development worker, international aid and innocent.  She feels that her life is in danger.  Experienced nightmares again last night caused her to wake up and states I stop myself from screaming well.  She wakes up feeling startled and on edge.  She reports that the voices are nice and are telling her that she can have everything today.  She denies visual hallucinations.  She denies SI/HI.    She had a good day yesterday and then in the evening had an increase in responding to stimuli and became agitated with her dad.  She required Seroquel 100 mg.  She is continued to worry about saving others.  She slept for documented 4 hours last night.    REVIEW OF SYSTEMS  Review of Systems   Constitutional: Negative for chills and fever.   Respiratory: Negative for shortness of breath.    Cardiovascular: Negative for chest pain.   Gastrointestinal: Negative for constipation, diarrhea, nausea and vomiting.   Skin: Negative for rash.   Neurological: Negative for dizziness, light-headedness and headaches.   Psychiatric/Behavioral: Positive for hallucinations and sleep disturbance. Negative for suicidal ideas.     ______________________________________________________________  OBJECTIVE                 Vital Signs:  Current                Vital Signs: 24 Hour Range   BP: 131/92 (03/30 0700)  Temp: 36.3 ?C (97.3 ?F) (03/30 0700)  Pulse: 96 (03/30 0700)  Respirations: 16 PER MINUTE (03/30 0700)  SpO2: 96 % (03/30 0700) BP: (131-144)/(92-98)   Temp:  [36.3 ?C (97.3 ?F)-36.7 ?C (98.1 ?F)]   Pulse:  [96-116]   Respirations:  [16 PER MINUTE-18 PER MINUTE]   SpO2:  [96 %-97 %]    Intensity Pain Scale (  Self Report): (not recorded)      Scheduled Medications:  cabergoline (DOSTINEX) (+) tablet 0.5 mg **PATIENT OWN MED**, 0.5 mg, Oral, Once per day on Mon Thu  levonorgestrel-ethinyl estradiol (AVIANE-28, LESSINA-28) tablet 1 tablet, 1 tablet, Oral, QDAY  LORazepam (ATIVAN) tablet 0.5 mg, 0.5 mg, Oral, BID  melatonin tablet 5 mg, 5 mg, Oral, QHS  metoprolol tartrate tablet 25 mg, 25 mg, Oral, QDAY  prazosin (MINIPRESS) capsule 1 mg, 1 mg, Oral, QHS  QUEtiapine (SEROquel) tablet 1,000 mg, 1,000 mg, Oral, QHS  temazepam (RESTORIL) capsule 15 mg, 15 mg, Oral, QHS  traZODone (DESYREL) tablet 100 mg, 100 mg, Oral, QHS        PRN Medications:  acetaminophen Q6H PRN 650 mg at 10/03/21 0812, calcium carbonate TID PRN, hydrOXYzine Q6H PRN 25 mg at 10/12/21 0007, QUEtiapine Q6H PRN 100 mg at 10/11/21 1759 **OR** OLANZapine Q6H PRN, ondansetron Q6H PRN 4 mg at 10/08/21 1717, polyethylene glycol 3350 QDAY PRN, senna/docusate QDAY PRN    MENTAL STATUS EXAMINATION  General/Constitutional: appears stated age, dressed appropriately, good hygiene   Eye Contact: good  Behavior: Calm, cooperative; appropriate for conversation  Speech: RRR with normal volume and tone. Good articulation  Mood: okay  Affect: appropriate; mood congruent  Thought Process: Mostly linear  Thought Content: denies SI, HI. Continued delusions involving others being harmed and paranoia  Perception: Endorses AH and denies VH  Associations: Intact  Insight/Judgment: poor-fair/fair    Physical Exam:  Gait: Ambulates without difficulty. Normal arm swing       ______________________________________________________________  [name], DO

## 2021-10-12 NOTE — Behavioral Health Treatment Team
TREATMENT TEAM NOTE  Name: Kristin Maynard          MRN: [id number]              DOB: [birth year]          Age: 26 y.o.  Admission Date: 09/23/2021                 Attendees:   Psychiatrist: Arnette Norris, DO  Psychiatrist: Alfredia Ferguson, APRN  Nurse: Dede Query, RN  Pharmacist: Renee Pain, PharmD  Therapist: Dewayne Hatch, [place], [place]  Therapist: Neita Carp, [place]  Case Manager: Tinnie Gens, LMSW  Case Manager: April Cordero, [place]  Utilization Review: Areatha Keas, RN        Significant Points Discussed: moved off red zone, doing well and going to groups; responding to internal stimuli; anxiety and depression=2; upset after call with dad, began screaming and insisting she is God; unable to have linear conversations without distress; slept 4 hours       Treatment Plan Discussion: will continue treatment       Projected Discharge Date: next week

## 2021-10-12 NOTE — Care Plan
Problem: Mood - Altered  Goal: Stabilize mood  Flowsheets (Taken 10/11/2021 2356)  Stabilize mood:   Assess coping style   Assess ineffective coping signs and symptoms   Establish therapeutic relationships  Goal: Knowledge of Altered Mood  Flowsheets (Taken 10/11/2021 2356)  Knowledge of altered mood: Provide relaxation techniques education     Problem: Thought Process - Altered  Goal: Demonstration of organized thought processes  Flowsheets (Taken 10/11/2021 2356)  Demonstration of organized thought processes:   Assess thought process   Provide environmental regulation - agitation   Assess in reality orientation

## 2021-10-12 NOTE — Progress Notes
PRN Medication Administered:  Atarax '25mg'$     Non pharmacological interventions attempted prior to PRN medication administration    -decrease in environmental stimuli, dimmed lighting    Brief narrative of reason for PRN medication administration:     -Pt yelling out in room. Pt reports that when she falls asleep, the  "dreams" or "nightmares" startle her awake and frighten   her and she becomes very anxious.

## 2021-10-12 NOTE — Care Coordination-Inpatient
Care Coordination - Inpatient Note      Patient Name:   Kristin Maynard                        MRN:  [id number]  Admission Date:  09/23/2021      2:38-2:44pm  CM spoke with patient's father, Chriss Redel ([phone]), to provide an update on discharge. CM informed [name] that patient will likely remain admitted until at least Tuesday. [name] states he spoke with the patient today and she begged him to come home, stating she was "only gonna bring the good voices home". CM and [name] processed further. CM updated [name] on this writer's session with patient and patient's ongoing delusional thoughts. CM to call patient's mom with an update and will continue to follow-up as needed.     2:48-2:54pm  CM spoke with patient's mom, Aletha Allebach ([phone]), to provide an update on discharge. CM also informed Almyra Free that patient will likely remain admitted until at least Tuesday, and also updated her on this writer's session with patient. CM and Almyra Free discussed patient's ongoing delusions and concerning behaviors. Almyra Free asked what to do for the patient, as well as what to do for herself and the patient's family. CM and Almyra Free processed further. CM emphasized the importance of ongoing therapy to address the impact these thoughts have on the patient. CM informed Almyra Free that a family meeting can be arranged prior to discharge to discuss additional questions/cocnerns. Almyra Free verbalized understanding and reports no additional needs at this time. CM to follow-up as needed.     Carmela Rima  10/12/2021

## 2021-10-12 NOTE — Group Note
Name: Kristin Maynard   MRN: [id number]     DOB: 02-28-1996      Age: 26 y.o.  Admission Date: 09/23/2021     LOS: 19 days     Date of Service: 10/12/2021      Group Topic: BH Values/Moral Compass  Group Date: 10/12/2021  Start Time: 0915  End Time: 1015  Facilitators: Garner Nash          Number of Participants: 13  Group Focus: communication, coping skills, feeling awareness/expression, goals/reality orientation, leisure skills, music therapy, relaxation, reminiscence, and social skills  Treatment Modality: Music Therapy  Interventions utilized were other Emotions and Music  Purpose: enhance coping skills, express feelings, improve communication skills, increase insight, and reinforce self-care    Emotions and Music: The therapist facilitated a discussion about how music can be used as a coping skill when experiencing and connecting with emotions. The group discussed how music can influence emotions as well. The therapist then provided the patients with circle that had the seven ?main? emotions and a blank outer circle; patients were then instructed to write down songs that reflected these emotions. The group then listened to songs they had written and other group members discussed their emotions surrounding it; the patient who had requested the song then shared their feelings with the song.    Name: Kristin Maynard Date of Birth: 1996-04-01   MR: [id number]      Level of Participation: minimal   Quality of Participation: distractible, passive, quiet, restless and withdrawn  Interactions with others: did not interact  Mood/Affect: anxious and restless  Cognition: confused and not focused  Progress: Minimal  Response: The patient minimally engaged within the group. The patient appeared confused by group instructions - listing other people's songs rather than person song choices. While present the patient appeared anxious, distracted, and restless. The patient requested to return to their room and left.  Plan: to continue treatment

## 2021-10-12 NOTE — Care Coordination-Inpatient
Care Coordination - Inpatient Note      Patient Name:   Kristin Maynard                        MRN:  [id number]  Admission Date:  09/23/2021    7:58-8:02am  CM spoke with patient's father, Matisha Termine ([phone]), to return missed call and voicemail. [name] expressed significant concern for the patient discharging. [name] states he spoke with the patient on the phone last night and patient stated the treatment team doesn't ask the right questions. Per [name], patient still thinks she has had a heart transplant from her sister-in-law (she feels they swapped hearts because the angels and demons wanted that). [name] states the patient was upset after the phone call. [name] states the patient's two younger sisters are planning on moving back into the house, so expressed additional concerns about them being around patient if patient is unstable. CM and [name] processed further. CM informed [name] that the treatment team is comfortable with patient remaining admitted, and this writer will call him later this afternoon after getting a better idea of discharge.  [name] verbalized understanding and reports no additional questions/concerns at this time.     8:03-8:11am  CM spoke with patient's mom, Artia Singley ([phone]), to return missed call and voicemail. [name] also expressed significant concern for the patient discharging. Per [name], during their visit on Tuesday night, the patient was still in a different world, dealing with the demons and angels, thinking she can heal others. [name] states the patient has continued to make bizarre statements during phone calls and visitation. CM and [name] processed further. Per [name], the patient thinks she needs to save the world and keeps talking about the good and evil. [name] reports the patient has been hearing voices since she was 26 years old, and that more severe episodes happen at night. CM informed [name] that patient was also able to verbalize things worsen at night. CM informed [name] that the treatment team is comfortable with patient remaining admitted, and this writer will call her later this afternoon after getting a better idea of discharge.  [name] verbalized understanding and reports no additional questions/concerns at this time.     [name]  10/12/2021

## 2021-10-12 NOTE — Group Note
Name: Brittish Bolinger   MRN: [id number]     DOB: [birth year]      Age: 26 y.o.  Admission Date: 09/23/2021     LOS: 19 days     Date of Service: 10/12/2021      Group Topic: [place] Leisure Activities  Group Date: 10/12/2021  Start Time: 1345  End Time: 1445  Facilitators: Earl Lites          Number of Participants: 9  Group Focus: coping skills, leisure skills, self-awareness, self-esteem, and social skills  Treatment Modality: Recreation Therapy  Interventions utilized were group exercise, leisure development, and mental fitness  Purpose: enhance coping skills, improve communication skills, increase insight, regain self-worth, and reinforce self-care    Patients exercised to music and then played Sequence a team game. Several of the patients like this game and requested that we play and teach the other patients how to play. This game requires focusing on winning and blocking the other team. The game is often quiet because everyone is concentrating.       Name: Emberly Date of Birth: [birth year]   MR: [id number]      Level of Participation: active   Quality of Participation: attentive and cooperative  Interactions with others: pulled from group for clinical purposes  Mood/Affect: appropriate and blunted  Cognition: unable to assess  Progress: Moderate  Response: Patient participated in the exercises and then was pulled from group. Patient did not play the game.  Plan: to continue treatment

## 2021-10-12 NOTE — Group Note
Name: Kristin Maynard   MRN: [id number]     DOB: October 20, 1995      Age: 26 y.o.  Admission Date: 09/23/2021     LOS: 19 days     Date of Service: 10/12/2021      Group Topic: BH Self-Compassion  Group Date: 10/12/2021  Start Time: 1245  End Time: 1345  Facilitators: Candie Chroman          Number of Participants: 12  Group Focus: coping skills  Treatment Modality: Art Therapy  Interventions utilized were assignment and exploration  Purpose: enhance coping skills, express feelings, and regain self-worth    Group members were encouraged to use tape and watercolor to create a symbol of hope to enhance coping skills, self-esteem, and resiliency .            Name: Kristin Maynard Date of Birth: April 14, 1996   MR: [id number]      Level of Participation: active   Quality of Participation: attentive, cooperative and engaged  Interactions with others: gave feedback  Mood/Affect: appropriate  Triggers (if applicable): NA  Cognition: logical  Progress: Moderate  Response: Fully participated  Plan: to continue treatment

## 2021-10-12 NOTE — Care Plan
Problem: Mood - Altered  Goal: Stabilize mood  Outcome: Goal Ongoing  Goal: Knowledge of Altered Mood  Outcome: Goal Ongoing     Problem: Self-esteem - Low  Goal: Demonstration of positive self-esteem  Outcome: Goal Ongoing     Problem: Thought Process - Altered  Goal: Demonstration of organized thought processes  Outcome: Goal Ongoing     Problem: Violence, self/other-directed, Risk of  Goal: Absence of violence  Outcome: Goal Ongoing  Goal: Knowledge of risk for violence, self/other-directed  Outcome: Goal Ongoing     Problem: Moderate Fall Risk  Goal: Moderate Fall Risk  Outcome: Goal Ongoing

## 2021-10-12 NOTE — Progress Notes
Reason for Visit:  Chaplain rounding    Faith/Religion: Patient stated she was [employer] of Purpose/Meaning:  Patient shared she is hoping to get better and go home to spend time with her family.    Worries/Concerns/Struggles: Patient shared she have been struggling with nightmares the last few nights.    Patient shared she was tired during the visit because she was not sleeping well.        Method(s) of Coping: Patient was not sure of a coping method during visit. However, when patient is home, patient shared she enjoys reading books, looking at movies, and spending time with her family makes her feel good.    Support System:  Patient stated her family is support for her.    Interventions/Plan: Chaplain was able to minister to patient by being present and active listening. Chaplain shared words of encouragement and prayed for patient.    Chaplain provided patient with a Bible.    The spiritual care team is available as needed, 24/7, through the campus switchboard [phone]). For a response within 24 hours, please submit an order in [facility] for a chaplain consult.                                       10/12/2021 12:07 PM Vanessa Kick      PCU 3 PCU

## 2021-10-13 MED ORDER — PRAZOSIN 1 MG PO CAP
2 mg | Freq: Every evening | ORAL | 0 refills | Status: DC
Start: 2021-10-13 — End: 2021-10-15
  Administered 2021-10-14 – 2021-10-15 (×2): 2 mg via ORAL

## 2021-10-13 MED ORDER — QUETIAPINE 200 MG PO TAB
800 mg | Freq: Every evening | ORAL | 0 refills | Status: DC
Start: 2021-10-13 — End: 2021-11-02
  Administered 2021-10-14 – 2021-11-02 (×20): 800 mg via ORAL

## 2021-10-13 MED ORDER — ARIPIPRAZOLE 5 MG PO TAB
5 mg | Freq: Every day | ORAL | 0 refills | Status: DC
Start: 2021-10-13 — End: 2021-10-16
  Administered 2021-10-13 – 2021-10-15 (×3): 5 mg via ORAL

## 2021-10-13 NOTE — Progress Notes
Psychiatry Progress Note  Name: Kristin Maynard          MRN: [id number] DOB: 1995/10/09         Age: 26 y.o.  Admission Date: 09/23/2021  LOS: 20 days    Service: [facility]    Principal Problem:    Schizophrenia (HCC)  Active Problems:    Pituitary macroadenoma (HCC)    Hyperprolactinemia (HCC)    Elevated BP without diagnosis of hypertension    Sinus tachycardia    Leukocytosis    PLAN  1. Continue admission for further stabilization   2. Continue temazepam 15mg  qhs (started 3/29)  3. Decrease Seroquel to 800mg  qhs for psychosis   4. Initiate Abilify 5mg  as patient has continued ongoing delusions and agitation with hallucinations and would likely benefit from dual antipsychotics   5. Increase prazosin to 2mg  for nightmares with plans to titrate up as tolerated/indicated  6. Continue cabergoline 2.5mg  twice weekly  7. Continue Ativan 0.5mg  BID with plans to taper as able   8. Continue other medications as currently prescribed  ?   REASONS FOR CONTINUED HOSPITALIZATION  Psychiatric stabilization and medication management. Patient is high risk for re-admission if discharged at this time. Patient is medically complex and has required multiple recent admissions. At this time she is unsafe to return home due to ongoing delusions that others need to be saved. She continues to require redirection and sleep has minimally improved in spite of multiple medication adjustments.      ______________________________________________________________  Kristin Maynard was seen in the consult room where she reports not having a good night.  She continues to worry that she is dying and that others are dying as well.  She feels like she has a headache today and describes it as her mind feeling blank and that it is hard to think of words.  She had nightmares again and tried to not screen because she did not want to wake others up.  She denies SI/HI but notes that she would like to hurt the voices.  She endorses ongoing auditory hallucinations and visual hallucinations of seeing a heart she believes belongs to the voices.    She required hydroxyzine x2 and Seroquel 100 mg for as needed medications.  She continues to have ongoing delusions of needing to save the world and will be observed responding to stimuli, yelling at them.    REVIEW OF SYSTEMS  Review of Systems   Constitutional: Negative for chills and fever.   Respiratory: Negative for shortness of breath.    Cardiovascular: Negative for chest pain.   Gastrointestinal: Negative for constipation, diarrhea, nausea and vomiting.   Skin: Negative for rash.   Neurological: Positive for headaches. Negative for dizziness and light-headedness.   Psychiatric/Behavioral: Positive for hallucinations and sleep disturbance. Negative for suicidal ideas.     ______________________________________________________________  OBJECTIVE                 Vital Signs:  Current                Vital Signs: 24 Hour Range   BP: 130/87 (03/30 1921)  Temp: 36.7 ?C (98.1 ?F) (03/30 1921)  Pulse: 111 (03/30 1921)  Respirations: 16 PER MINUTE (03/30 1921)  SpO2: 97 % (03/30 1921) BP: (130)/(87)   Temp:  [36.7 ?C (98.1 ?F)]   Pulse:  [111]   Respirations:  [16 PER MINUTE]   SpO2:  [97 %]    Intensity Pain Scale (Self  Report): (not recorded)      Scheduled Medications:  cabergoline (DOSTINEX) (+) tablet 0.5 mg **PATIENT OWN MED**, 0.5 mg, Oral, Once per day on Mon Thu  levonorgestrel-ethinyl estradiol (AVIANE-28, LESSINA-28) tablet 1 tablet, 1 tablet, Oral, QDAY  LORazepam (ATIVAN) tablet 0.5 mg, 0.5 mg, Oral, BID  melatonin tablet 5 mg, 5 mg, Oral, QHS  metoprolol tartrate tablet 25 mg, 25 mg, Oral, QDAY  prazosin (MINIPRESS) capsule 1 mg, 1 mg, Oral, QHS  QUEtiapine (SEROquel) tablet 1,000 mg, 1,000 mg, Oral, QHS  temazepam (RESTORIL) capsule 15 mg, 15 mg, Oral, QHS  traZODone (DESYREL) tablet 100 mg, 100 mg, Oral, QHS        PRN Medications:  acetaminophen Q6H PRN 650 mg at 10/03/21 0812, calcium carbonate TID PRN, hydrOXYzine Q6H PRN 25 mg at 10/12/21 1618, QUEtiapine Q6H PRN 100 mg at 10/12/21 1619 **OR** OLANZapine Q6H PRN, ondansetron Q6H PRN 4 mg at 10/08/21 1717, polyethylene glycol 3350 QDAY PRN, senna/docusate QDAY PRN    MENTAL STATUS EXAMINATION  General/Constitutional: appears stated age, dressed appropriately, good hygiene   Eye Contact: good  Behavior: Calm, cooperative; appropriate for conversation  Speech: RRR with normal volume and tone. Good articulation  Mood: okay today  Affect: appropriate to conversation; mood congruent  Thought Process: Mostly linear  Thought Content: denies SI, HI. Delusions and paranoia   Perception: Endorses AH and VH  Associations: Intact  Insight/Judgment: poor-fair/fair    Physical Exam:  Gait: Ambulates without difficulty. Normal arm swing       ______________________________________________________________  [name], DO

## 2021-10-13 NOTE — Group Note
Name: Kristin Maynard   MRN: [id number]     DOB: 18-Apr-1996      Age: 26 y.o.  Admission Date: 09/23/2021     LOS: 20 days     Date of Service: 10/13/2021      Group Topic: BH Relaxation and Self-Soothing  Group Date: 10/13/2021  Start Time: 0900  End Time: 1000  Facilitators: Candie Chroman          Number of Participants: 8  Group Focus: coping skills  Treatment Modality: Art Therapy  Interventions utilized were exploration  Purpose: enhance coping skills, express feelings, and reinforce self-care    Group members were provided with air-dry clay and participated in a free art session to create a sculpture of anything they would like. They were also allowed to simply use the clay without a specific product in mind. They were encouraged to focus on the sensory and structural aspects of the material, and to ask for assistance as needed.           Name: Kristin Maynard Date of Birth: [birth year]   MR: [id number]      Level of Participation: moderate   Quality of Participation: attentive and cooperative  Interactions with others: gave feedback  Mood/Affect: flat  Triggers (if applicable): NA  Cognition: concrete  Progress: Moderate  Response: Patient engaged in art making for the first half of group and then requested to return to her room  Plan: to continue treatment

## 2021-10-13 NOTE — Behavioral Health Treatment Team
TREATMENT TEAM NOTE  Name: Kristin Maynard          MRN: [id number]              DOB: [birth year]          Age: 26 y.o.  Admission Date: 09/23/2021                 Attendees:   Psychiatrist: Arnette Norris, DO  Nurse Supervisor: Cyndia Skeeters, RN, BSN  Nurse: Dede Query, RN  Pharmacist: Doreen Beam, PharmD  Therapist: Dewayne Hatch, [place], [place]  Therapist: Neita Carp, [place]  Case Manager: Carmela Rima, LMSW  Case Manager: Tinnie Gens, LMSW  Case Manager: April Cordero, [place]  Utilization Review: Areatha Keas, RN    Significant Points Discussed: Responding to interal stimuli. Wanting to save world. Yelling out overnight to hallucinations. Slept 2 hours.    Treatment Plan Discussion: Continue treatment.    Projected Discharge Date: Possible 10/17/21.

## 2021-10-13 NOTE — Group Note
Name: Kristin Maynard   MRN: [id number]     DOB: 26-Dec-1995      Age: 26 y.o.  Admission Date: 09/23/2021     LOS: 20 days     Date of Service: 10/13/2021      Group Topic: BH Connection  Group Date: 10/13/2021  Start Time: 1245  End Time: 1345  Facilitators: Harrell Lark          Number of Participants: 8  Group Focus: communication, coping skills, feeling awareness/expression, leisure skills, music therapy, relaxation, reminiscence, and social skills  Treatment Modality: Music Therapy  Interventions utilized were other Life Albums  Purpose: enhance coping skills, express feelings, improve communication skills, and reinforce self-care    Life Album: The patients were given papers with the mental health play list worksheet and colored pencils. The therapist instructed clients to write down a song within each category of the play list while playing soft accompanying music. The patients then used the back of the play list and the colored pencils to draw their album cover art. The therapist then gave the clients the opportunity to share within the group and listened to their listed songs.      Name: Brilyn Date of Birth: 1996/01/14   MR: [id number]      Level of Participation: moderate   Quality of Participation: attentive, cooperative and engaged  Interactions with others: did not interact  Mood/Affect: anxious and flat  Cognition: Could not assess  Progress: Moderate  Response: The patient moderately engaged within the group. During the group, the patient quietly worked on their play list - occasionally observed responding to internal stimuli. The patient the returned their materials to the therapist and left the group  Plan: to continue treatment

## 2021-10-13 NOTE — Care Plan
Problem: Skin Integrity  Goal: Skin integrity intact  Outcome: Goal Ongoing  Goal: Healing of skin (Wound & Incision)  Outcome: Goal Ongoing  Goal: Healing of skin (Pressure Injury)  Outcome: Goal Ongoing     Problem: Harm to self, high risk of suicide  Goal: Absence of Harm to Self  Outcome: Goal Ongoing     Problem: Elopement Prevention  Goal: Patients without Decisional Capacity will not elope from the care setting  Outcome: Goal Ongoing     Problem: High Fall Risk  Goal: High Fall Risk  Outcome: Goal Ongoing     Problem: Mood - Altered  Goal: Stabilize mood  Outcome: Goal Ongoing  Goal: Knowledge of Altered Mood  Outcome: Goal Ongoing     Problem: Self-esteem - Low  Goal: Demonstration of positive self-esteem  Outcome: Goal Ongoing     Problem: Thought Process - Altered  Goal: Demonstration of organized thought processes  Outcome: Goal Ongoing     Problem: Violence, self/other-directed, Risk of  Goal: Absence of violence  Outcome: Goal Ongoing  Goal: Knowledge of risk for violence, self/other-directed  Outcome: Goal Ongoing     Problem: Moderate Fall Risk  Goal: Moderate Fall Risk  Outcome: Goal Ongoing

## 2021-10-13 NOTE — Care Plan
Problem: Mood - Altered  Goal: Stabilize mood  Flowsheets (Taken 10/12/2021 2322)  Stabilize mood:   Assess coping style   Assess ineffective coping signs and symptoms   Establish therapeutic relationships  Goal: Knowledge of Altered Mood  Flowsheets (Taken 10/11/2021 2356)  Knowledge of altered mood: Provide relaxation techniques education     Problem: Thought Process - Altered  Goal: Demonstration of organized thought processes  Flowsheets (Taken 10/11/2021 2356)  Demonstration of organized thought processes:   Assess thought process   Provide environmental regulation - agitation   Assess in reality orientation

## 2021-10-14 NOTE — Group Note
Name: Kristin Maynard   MRN: [id number]     DOB: Feb 13, 1996      Age: 26 y.o.  Admission Date: 09/23/2021     LOS: 21 days     Date of Service: 10/14/2021      Group Topic: [place] Goal Setting  Group Date: 10/14/2021  Start Time: 1300  End Time: 1340  Facilitators: Avina Eberle          Number of Participants: 6  Group Focus: goals/reality orientation  Treatment Modality: Psychoeducation  Interventions utilized were patient education  Purpose: SMART goals          Name: Kristin Maynard Date of Birth: [birth year]   MR: [id number]      Level of Participation: patient not present for group

## 2021-10-14 NOTE — Group Note
Name: Kristin Maynard   MRN: [id number]     DOB: 10/08/1995      Age: 26 y.o.  Admission Date: 09/23/2021     LOS: 21 days     Date of Service: 10/14/2021      Group Topic: BH Boundaries  Group Date: 10/14/2021  Start Time: 1030  End Time: 1115  Facilitators: Isadore Palecek          Number of Participants: 8  Group Focus: communication, boundaries  Treatment Modality: Psychoeducation  Interventions utilized were exploration and problem solving  Purpose: express feelings and improve communication skills    This therapist spoke with the group about boundaries. The group members were able to come up with a good definition of boundaries and why they are important. The group had a good discussion on different types of boundaries and how to express them.          Name: Kristin Maynard Date of Birth: [birth year]   MR: [id number]      Level of Participation: patient not present for group

## 2021-10-14 NOTE — Care Plan
Problem: Harm to self, high risk of suicide  Goal: Absence of Harm to Self  Outcome: Goal Ongoing  Flowsheets (Taken 10/14/2021 1237)  Absence of harm to self:   Assess and report significant changes in mood and affect   Maintain a safe environment   Reassess for suicide risk daily     Problem: Mood - Altered  Goal: Stabilize mood  Outcome: Goal Ongoing  Flowsheets (Taken 10/13/2021 2018 by Jamas Lav, RN)  Stabilize mood:   Assess coping style   Establish therapeutic relationships   Assess depression history     Problem: Thought Process - Altered  Goal: Demonstration of organized thought processes  Outcome: Goal Ongoing  Flowsheets (Taken 10/14/2021 1237)  Demonstration of organized thought processes:   Assess cognitive ability   Assess thought process   Provide environmental regulation - agitation

## 2021-10-14 NOTE — Group Note
Name: Kristin Maynard   MRN: [id number]     DOB: October 15, 1995      Age: 26 y.o.  Admission Date: 09/23/2021     LOS: 21 days     Date of Service: 10/14/2021      Group Topic: BH Connection  Group Date: 10/14/2021  Start Time: 0900  End Time: 1000  Facilitators: Harrell Lark          Number of Participants: 8  Group Focus: communication, community group, concentration, coping skills, feeling awareness/expression, healthy friendships, impulsivity, leisure skills, music therapy, reality orientation, relaxation, reminiscence, and social skills  Treatment Modality: Music Therapy  Interventions utilized were other Therapeutic Drumming  Purpose: enhance coping skills, express feelings, improve communication skills, increase insight, and reinforce self-care    Therapeutic Drumming: The therapist provided patients with the opportunity to engage in therapeutic drumming interventions. These intervention included: "I Am" Chant, Heartbeat leadership roles, and history of drumming. The purposes of these interventions were to enhance coping skills, emotional expression, communication, insight, self-care, communication, and self-confidence.       Name: Kristin Maynard Date of Birth: 1996/01/14   MR: [id number]      Level of Participation: active   Quality of Participation: attentive, cooperative and distractible  Interactions with others: gave feedback  Mood/Affect: flat  Cognition: coherent/clear and responding to internal stimuli  Progress: Moderate  Response: The patient actively participated within the group. The patient engaged in active music-making using a variety of instruments and interacted with other group members. During the group, the patient was observed occasionally responding to internal stimuli.  Plan: to continue treatment

## 2021-10-14 NOTE — Care Plan
Problem: Mood - Altered  Goal: Stabilize mood  Flowsheets (Taken 10/13/2021 2018)  Stabilize mood:   Assess coping style   Establish therapeutic relationships   Assess depression history     Problem: Self-esteem - Low  Goal: Demonstration of positive self-esteem  Flowsheets (Taken 10/02/2021 0940 by Marigene Ehlers, RN)  Demonstration of positive self-esteem: Provide positive reinforcement     Problem: Thought Process - Altered  Goal: Demonstration of organized thought processes  Flowsheets (Taken 10/11/2021 2356)  Demonstration of organized thought processes:   Assess thought process   Provide environmental regulation - agitation   Assess in reality orientation

## 2021-10-14 NOTE — Group Note
Name: Kristin Maynard   MRN: [id number]     DOB: May 28, 1996      Age: 26 y.o.  Admission Date: 09/23/2021     LOS: 21 days     Date of Service: 10/14/2021      Group Topic: BH Self-Care  Group Date: 10/14/2021  Start Time: 1115  End Time: 1200  Facilitators: Harrell Lark          Number of Participants: 9  Group Focus: communication, concentration, coping skills, feeling awareness/expression, impulsivity, leisure skills, relaxation, reminiscence, and social skills  Treatment Modality: Music Therapy  Interventions utilized were other Music and Affirmation Coloring  Purpose: enhance coping skills, express feelings, improve communication skills, and reinforce self-care    Music and Affirmation Coloring: The patients were provided with coloring pages of affirmative statements such as "Enjoy the little things," "Don't give up," "You can do hard things," and "Start now not tomorrow," to promote positive thinking and self-care. While working, the patient listened to preferred music that they found relaxing. The purposes of this intervention were to promote self-care, leisure skills, relaxation, express feelings, improve social skills, and positive thinking.      Name: Cason Date of Birth: 06/19/1996   MR: [id number]      Level of Participation: active   Quality of Participation: attentive, cooperative and engaged  Interactions with others: gave feedback  Mood/Affect: appropriate  Cognition: goal directed and responding to internal stimuli  Progress: Moderate  Response: The patient actively participated within the group. The patient interacted with group materials and gave a song for group listening with prompting. Throughout the group, the patient was observed quietly responding to internal stimuli.  Plan: to continue treatment

## 2021-10-15 MED ORDER — PRAZOSIN 1 MG PO CAP
3 mg | Freq: Every evening | ORAL | 0 refills | Status: DC
Start: 2021-10-15 — End: 2021-10-20
  Administered 2021-10-16 – 2021-10-20 (×5): 3 mg via ORAL

## 2021-10-15 NOTE — Group Note
Name: Kristin Maynard   MRN: [id number]     DOB: 1996/02/10      Age: 26 y.o.  Admission Date: 09/23/2021     LOS: 22 days     Date of Service: 10/15/2021      Group Topic: [place] Leisure Activities  Group Date: 10/15/2021  Start Time: 0930  End Time: 1324  Facilitators: Earl Lites          Number of Participants: 7  Group Focus: coping skills, leisure skills, self-awareness, self-esteem, and social skills  Treatment Modality: Recreation Therapy  Interventions utilized were exploration, group exercise, leisure development, and mental fitness  Purpose: enhance coping skills, express feelings, improve communication skills, increase insight, regain self-worth, and reinforce self-care    Patient exercised and walked to music. Patients filled out a shield about themselves. Patients wrote down their favorite leisure activities, what they want to achieve at [facility], their goals, hopes, what they are thankful for, favorite places to be and three words to describe themselves. The shields were gathered and some of the things were read out loud. Patients guessed who the shields belonged to. The back of the paper had a self-esteem journal that the patients could fill out on their own and journals were handed out to those who would like one. Patients then played rock, paper, scissors as a competition, an Engineer, building services using leisure skills and charades.       Name: Kristin Maynard Date of Birth: Feb 04, 1996   MR: [id number]      Level of Participation: active   Quality of Participation: attentive, cooperative and engaged  Interactions with others: gave feedback  Mood/Affect: appropriate and positive  Cognition: goal directed  Progress: Moderate  Response: Patient remained in group the entire time and was engaged in all activities.  Plan: to continue treatment

## 2021-10-15 NOTE — Care Plan
Problem: Skin Integrity  Goal: Skin integrity intact  Outcome: Goal Ongoing  Goal: Healing of skin (Wound & Incision)  Outcome: Goal Ongoing  Goal: Healing of skin (Pressure Injury)  Outcome: Goal Ongoing     Problem: Harm to self, high risk of suicide  Goal: Absence of Harm to Self  Outcome: Goal Ongoing     Problem: Elopement Prevention  Goal: Patients without Decisional Capacity will not elope from the care setting  Outcome: Goal Ongoing     Problem: High Fall Risk  Goal: High Fall Risk  Outcome: Goal Ongoing     Problem: Mood - Altered  Goal: Stabilize mood  Outcome: Goal Ongoing  Goal: Knowledge of Altered Mood  Outcome: Goal Ongoing     Problem: Self-esteem - Low  Goal: Demonstration of positive self-esteem  Outcome: Goal Ongoing     Problem: Thought Process - Altered  Goal: Demonstration of organized thought processes  Outcome: Goal Ongoing

## 2021-10-15 NOTE — Progress Notes
The pt woke up c/o seeing things, stating that something was making her feel very anxious, writer gave prn atarax po at 0408, Probation officer will continue to monitor patients behavior.

## 2021-10-15 NOTE — Care Plan
Problem: Harm to self, high risk of suicide  Goal: Absence of Harm to Self  Flowsheets (Taken 10/14/2021 2102)  Absence of harm to self:   Assess and report significant changes in mood and affect   Complete the environmental risk assessment   Maintain a safe environment   Consult Psychiatry   Reassess for suicide risk daily     Problem: Mood - Altered  Goal: Knowledge of Altered Mood  Flowsheets (Taken 10/11/2021 2356 by Jamas Lav, RN)  Knowledge of altered mood: Provide relaxation techniques education     Problem: Violence, self/other-directed, Risk of  Goal: Absence of violence  Flowsheets (Taken 10/14/2021 2102)  Absence of violence:   Violent behavior risk assessment   Suicide assessment   Environmental safety management   Supervise medication intake

## 2021-10-15 NOTE — Progress Notes
Psychiatry Progress Note  Name: Adamarys Shall          MRN: [id number] DOB: Mar 06, 1996         Age: 26 y.o.  Admission Date: 09/23/2021  LOS: 22 days    Service: [facility] Adult Psych A    Principal Problem:    Schizophrenia (HCC)  Active Problems:    Pituitary macroadenoma (HCC)    Hyperprolactinemia (HCC)    Elevated BP without diagnosis of hypertension    Sinus tachycardia    Leukocytosis    PLAN  1. Increase prazosin to 3mg  qhs for nightmares.    2. Started Abilify on 3/31: consider increasing to 10mg  daily for continued psychosis.     REASONS FOR CONTINUED HOSPITALIZATION  Psychiatric stabilization and medication management.  Continues with significant psychosis.    ______________________________________________________________  SUBJECTIVE  Ms. Alicia is seen in her room.  She notes she is tired because she couldn't sleep due to the nightmares.  She laid in bed all night but every time she went to sleep had nightmares that woke her up.  Mood is mad that I didn't sleep.  She denies SI this morning.  However, she notes I feel I need to hurt everyone else because of her nightmares.  She denies AH saying not now but noted she saw a pair of hands, that were her hands but she didn't have control of them that were trying to choke her.  She notes she is hungry this morning.      REVIEW OF SYSTEMS  Review of Systems   Gastrointestinal: Negative for constipation.   Genitourinary: Negative for difficulty urinating.   Psychiatric/Behavioral: Positive for dysphoric mood, hallucinations and sleep disturbance (didn't sleep well due to nightmares). Negative for suicidal ideas.        Irritability     ______________________________________________________________  OBJECTIVE                 Vital Signs:  Current                Vital Signs: 24 Hour Range   BP: 140/89 (04/02 0828)  Temp: 36.2 ?C (97.1 ?F) (04/02 0800)  Pulse: 113 (04/02 0800)  Respirations: 16 PER MINUTE (04/02 0800)  SpO2: 97 % (04/02 0800) BP: (140-143)/(89-100)   Temp: [36.2 ?C (97.1 ?F)-36.7 ?C (98.1 ?F)]   Pulse:  [102-113]   Respirations:  [16 PER MINUTE-18 PER MINUTE]   SpO2:  [97 %]    Intensity Pain Scale (Self Report): (not recorded)      Scheduled Medications:  ARIPiprazole (ABILIFY) tablet 5 mg, 5 mg, Oral, QDAY  cabergoline (DOSTINEX) (+) tablet 0.5 mg **PATIENT OWN MED**, 0.5 mg, Oral, Once per day on Mon Thu  levonorgestrel-ethinyl estradiol (AVIANE-28, LESSINA-28) tablet 1 tablet, 1 tablet, Oral, QDAY  LORazepam (ATIVAN) tablet 0.5 mg, 0.5 mg, Oral, BID  melatonin tablet 5 mg, 5 mg, Oral, QHS  metoprolol tartrate tablet 25 mg, 25 mg, Oral, QDAY  prazosin (MINIPRESS) capsule 2 mg, 2 mg, Oral, QHS  QUEtiapine (SEROquel) tablet 800 mg, 800 mg, Oral, QHS  temazepam (RESTORIL) capsule 15 mg, 15 mg, Oral, QHS  traZODone (DESYREL) tablet 100 mg, 100 mg, Oral, QHS        PRN Medications:  acetaminophen Q6H PRN 650 mg at 10/13/21 0912, calcium carbonate TID PRN, hydrOXYzine Q6H PRN 25 mg at 10/15/21 0408, QUEtiapine Q6H PRN 100 mg at 10/15/21 0449 **OR** OLANZapine Q6H PRN, ondansetron Q6H PRN 4 mg at 10/08/21 1717, polyethylene glycol  3350 QDAY PRN, senna/docusate QDAY PRN    Mental Status Exam:    General Appearance: Fair grooming, cooperative, poor eye contact--was lying in bed while she talked to me and just stared at the ceiling  Speech: normal rate and volume, normal articulation with coherence and some spontaneity  Thought Processes: answers questions appropriately  Associations: less derailment  Thought Content: Denies SI when asked.  Endorses feeling like she needs to hurt everyone else.  Endorses AH but not now and VH of hands choking her.  Mood and affect: mad because she didn't sleep  Insight and Judgment: poor and poor    Gait: normal         ______________________________________________________________  Julianne Handler May, MD

## 2021-10-15 NOTE — Group Note
Name: Kristin Maynard   MRN: [id number]     DOB: 03/22/1996      Age: 26 y.o.  Admission Date: 09/23/2021     LOS: 22 days     Date of Service: 10/15/2021      Group Topic: BH Boundaries  Group Date: 10/15/2021  Start Time: 1100  End Time: 1200  Facilitators: Donovan Kail      Group Focus: other Boundaries exploration and intention setting for improving values based boundaries  Treatment Modality: Group Psychotherapy  Interventions utilized were exploration, problem solving, and support  Purpose: enhance coping skills, express feelings, improve communication skills, increase insight, regain self-worth, and reinforce self-care          Name: Kristin Maynard Date of Birth: 1996-06-01   MR: [id number]      Level of Participation: moderate   Quality of Participation: attentive  Interactions with others: gave feedback  Mood/Affect: brightens with interaction  Triggers (if applicable): none  Cognition: coherent/clear and talking to self  Progress: Moderate  Response: Patient engaged, shared feedback, supportive of peers. Demonstrated good insight into topic.   Plan: to continue treatment

## 2021-10-15 NOTE — Group Note
Name: Kristin Maynard   MRN: [id number]     DOB: 06-17-1996      Age: 26 y.o.  Admission Date: 09/23/2021     LOS: 22 days     Date of Service: 10/15/2021      Group Topic: BH Self-Compassion  Group Date: 10/15/2021  Start Time: 1406  End Time: 1435  Facilitators: Daevion Navarette          Number of Participants: 9  Group Focus: forgiveness and self-awareness  Treatment Modality: Group Psychotherapy  Interventions utilized were group exercise  Purpose: explore maladaptive thinking, express feelings, increase insight, and regain Astronomer passed out handout  titled "Letter of Forgiveness to [employer] and writing utensil to participants.  Writer provided brief pyschoeducation on benefits of forgiveness, Writer directed participants to complete handout, participants engaged in self-disclosure      Name: Kristin Maynard Date of Birth: [birth year]   MR: [id number]      Level of Participation: patient not present for group     Plan: to continue treatment

## 2021-10-16 MED ORDER — ARIPIPRAZOLE 10 MG PO TAB
10 mg | Freq: Every day | ORAL | 0 refills | Status: DC
Start: 2021-10-16 — End: 2021-10-18
  Administered 2021-10-16 – 2021-10-18 (×3): 10 mg via ORAL

## 2021-10-16 NOTE — Group Note
Name: Kristin Maynard   MRN: [id number]     DOB: 04-29-1996      Age: 26 y.o.  Admission Date: 09/23/2021     LOS: 23 days     Date of Service: 10/16/2021      Group Topic: BH Relaxation and Self-Soothing  Group Date: 10/16/2021  Start Time: 0915  End Time: 1010  Facilitators: Earl Lites          Number of Participants: 14  Group Focus: coping skills, leisure skills, relaxation, self-awareness, and self-esteem  Treatment Modality: Recreation Therapy  Interventions utilized were group exercise, leisure development, and mental fitness  Purpose: enhance coping skills, increase insight, regain self-worth, and reinforce self-care    Patients exercised to music to help improve their fitness level. Patients shared what helps them relax and then participated in meditation. Patients did some stretching exercises to music after meditation.      Name: Kristin Maynard Date of Birth: [birth year]   MR: [id number]      Level of Participation: patient not present for group   Plan: to continue treatment

## 2021-10-16 NOTE — Progress Notes
Psychiatry Progress Note  Name: Kristin Maynard          MRN: [id number] DOB: August 03, 1995         Age: 26 y.o.  Admission Date: 09/23/2021  LOS: 23 days    Service: [facility] Adult Psych A    Principal Problem:    Schizophrenia (HCC)  Active Problems:    Pituitary macroadenoma (HCC)    Hyperprolactinemia (HCC)    Elevated BP without diagnosis of hypertension    Sinus tachycardia    Leukocytosis    PLAN  1. Continued admission for stabilization  2. Increase Abilify to 10mg  daily for psychosis  3. Continue other medications as currently prescribed      REASONS FOR CONTINUED HOSPITALIZATION  Psychiatric stabilization and medication management.  Continues with significant psychosis.    ______________________________________________________________  SUBJECTIVE  Kristin Maynard is seen in her room where she was sleeping. She reported continuing to have nightmares that kept her awake. She feels tired today. The voices are very quiet today but they are scared because they did something they shouldn't have done. She continues to feel the need to save the world and was awake last night because she had to try to save the world for a third time. She requests a shield so that it doesn't hurt. She denies SI/HI/VH.     Kristin Maynard did not sleep at all last night. She continues to be paranoid and required PRN seroquel.     REVIEW OF SYSTEMS  Review of Systems   Constitutional: Positive for fatigue.   HENT: Negative for congestion.    Respiratory: Negative for cough.    Gastrointestinal: Negative for constipation.   Genitourinary: Negative for difficulty urinating.   Psychiatric/Behavioral: Positive for dysphoric mood, hallucinations and sleep disturbance (didn't sleep well due to nightmares). Negative for suicidal ideas.        Irritability     ______________________________________________________________  OBJECTIVE                 Vital Signs:  Current                Vital Signs: 24 Hour Range   BP: 142/98 (04/03 0800)  Temp: 36.2 ?C (97.2 ?F) (04/03 0800)  Pulse: 102 (04/03 0800)  Respirations: 16 PER MINUTE (04/03 0800)  SpO2: 98 % (04/03 0800) BP: (128-142)/(83-98)   Temp:  [36.2 ?C (97.2 ?F)-36.3 ?C (97.3 ?F)]   Pulse:  [101-102]   Respirations:  [16 PER MINUTE-18 PER MINUTE]   SpO2:  [96 %-98 %]    Intensity Pain Scale (Self Report): (not recorded)      Scheduled Medications:  ARIPiprazole (ABILIFY) tablet 10 mg, 10 mg, Oral, QDAY  cabergoline (DOSTINEX) (+) tablet 0.5 mg **PATIENT OWN MED**, 0.5 mg, Oral, Once per day on Mon Thu  levonorgestrel-ethinyl estradiol (AVIANE-28, LESSINA-28) tablet 1 tablet, 1 tablet, Oral, QDAY  LORazepam (ATIVAN) tablet 0.5 mg, 0.5 mg, Oral, BID  melatonin tablet 5 mg, 5 mg, Oral, QHS  metoprolol tartrate tablet 25 mg, 25 mg, Oral, QDAY  prazosin (MINIPRESS) capsule 3 mg, 3 mg, Oral, QHS  QUEtiapine (SEROquel) tablet 800 mg, 800 mg, Oral, QHS  temazepam (RESTORIL) capsule 15 mg, 15 mg, Oral, QHS  traZODone (DESYREL) tablet 100 mg, 100 mg, Oral, QHS        PRN Medications:  acetaminophen Q6H PRN 650 mg at 10/16/21 0917, calcium carbonate TID PRN, hydrOXYzine Q6H PRN 25 mg at 10/16/21 0916, QUEtiapine Q6H PRN 100 mg at 10/15/21 2139 **OR**  OLANZapine Q6H PRN, ondansetron Q6H PRN 4 mg at 10/15/21 1343, polyethylene glycol 3350 QDAY PRN, senna/docusate QDAY PRN    MENTAL STATUS EXAMINATION  General/Constitutional: appears stated age, dressed appropriately, good hygiene, laying in bed and tired appearing   Eye Contact: good  Behavior: Calm, cooperative; appropriate for conversation  Speech: RRR with normal volume and tone. Good articulation  Mood: tired  Affect: restricted; mood congruent  Thought Process: tangential at times   Thought Content: denies SI, HI. No evidence of delusions  Perception: Denies AVH  Associations: Intact  Insight/Judgment: poor/poor    Physical Exam:  Neuro: no tremors or dystonia noted      ______________________________________________________________  [name], DO

## 2021-10-16 NOTE — Group Note
Name: Kristin Maynard   MRN: [id number]     DOB: 03/26/1996      Age: 26 y.o.  Admission Date: 09/23/2021     LOS: 23 days     Date of Service: 10/16/2021      Group Topic: BH Coping Skills  Group Date: 10/16/2021  Start Time: 1100  End Time: 1145  Facilitators: Julieta Bellini          Number of Participants: 12  Group Focus: coping skills  Treatment Modality: Group Psychotherapy  Interventions utilized were confrontation and group exercise  Purpose: enhance coping skills, regain self-worth, and reinforce self-care          Name: Kristin Maynard Date of Birth: 08/18/1995   MR: [id number]      Level of Participation: patient not present for group

## 2021-10-16 NOTE — Care Plan
Problem: High Fall Risk  Goal: High Fall Risk  Outcome: Goal Ongoing     Problem: Mood - Altered  Goal: Stabilize mood  Outcome: Goal Ongoing  Goal: Knowledge of Altered Mood  Outcome: Goal Ongoing     Problem: Violence, self/other-directed, Risk of  Goal: Absence of violence  Outcome: Goal Ongoing  Goal: Knowledge of risk for violence, self/other-directed  Outcome: Goal Ongoing     Problem: Health Maintenance - Impaired  Goal: Able to perfom ADL's  Outcome: Goal Ongoing  Goal: Adequate nutritional intake  Outcome: Goal Ongoing  Goal: Establish therapeutic relationships  Outcome: Goal Ongoing  Goal: Knowledge of health maintenance  Outcome: Goal Ongoing

## 2021-10-16 NOTE — Progress Notes
PRN Medication Administered:  Seroquel 100 mg PO    Non pharmacological interventions attempted prior to PRN medication administration:  Verbal de-escalation techniques and Decrease stimuli    Brief narrative of reason for PRN medication administration:   Pt was seen pacing and responding to internal stimuli. Pt reported that "the voices" were bothering her. Pt was offered PRN Seroquel for psychosis and agitation which she agreed to take.

## 2021-10-16 NOTE — Behavioral Health Treatment Team
TREATMENT TEAM NOTE  Name: Kristin Maynard          MRN: [id number]              DOB: 1995/11/23          Age: 26 y.o.  Admission Date: 09/23/2021                 Attendees:   Psychiatrist: Arnette Norris, DO  Nurse Supervisor: Cyndia Skeeters, RN, BSN  Nurse: Dede Query, RN  Pharmacist: Dahlia Client, PharmD  Therapist: Dewayne Hatch, [place], [place]  Therapist: Neita Carp, [place]  Case Manager: Tinnie Gens, LMSW  Utilization Review: Areatha Keas, RN    Significant Points Discussed: No sleep last HS. Continued AVH. Believes pt is God. HI towards '[place] guy' (another pt). Paranoia.      Treatment Plan Discussion: Continue treatment.      Projected Discharge Date: Wed or Thurs.

## 2021-10-16 NOTE — Group Note
Name: Farrah Skoda   MRN: [id number]     DOB: [birth year]      Age: 26 y.o.  Admission Date: 09/23/2021     LOS: 23 days     Date of Service: 10/16/2021      Group Topic: BH Relaxation and Self-Soothing  Group Date: 10/16/2021  Start Time: 1245  End Time: 1400  Facilitators: Candie Chroman          Number of Participants: 12  Group Focus: coping skills  Treatment Modality: Art Therapy  Interventions utilized were exploration  Purpose: enhance coping skills, express feelings, and reinforce self-care    Therapist played calming music and encouraged group members to experiment with watercolor techniques for relaxation. Participants who made a sculpture in our previous session were allowed to paint their clay.            Name: Geniya Date of Birth: [birth year]   MR: [id number]      Level of Participation: active   Quality of Participation: attentive, cooperative and engaged  Interactions with others: gave feedback  Mood/Affect: appropriate and brightens with interaction  Triggers (if applicable): NA  Cognition: coherent/clear and goal directed  Progress: Moderate  Response: Fully participated  Plan: to continue treatment

## 2021-10-16 NOTE — Care Plan
Problem: Mood - Altered  Goal: Stabilize mood  Outcome: Goal Ongoing  Flowsheets (Taken 10/15/2021 2158)  Stabilize mood:   Assess coping style   Assess ineffective coping signs and symptoms   Assess depression history   Establish therapeutic relationships   Assess depressive symptoms     Problem: Thought Process - Altered  Goal: Demonstration of organized thought processes  Outcome: Goal Ongoing  Flowsheets (Taken 10/15/2021 2158)  Demonstration of organized thought processes:   Assess cognitive ability   Assess thought process   Assess disturbed thought process signs and symptoms   Provide environmental regulation - agitation   Assess in reality orientation   Assess risk for impaired cognition     Problem: Health Maintenance - Impaired  Goal: Establish therapeutic relationships  Outcome: Goal Ongoing  Flowsheets (Taken 10/15/2021 2159)  Establish therapeutic relationships:   Encourage expressions of feelings   Assist patient in setting realistic expectations   Involve patient in decision-making process     Problem: Cognitive-Perceptual Pattern - Impaired  Goal: Demonstration of organized thought processes  Flowsheets (Taken 10/15/2021 2159)  Demonstration of organized thought process:   Assess abnormal thought content signs and symptoms   Assess hallucination signs and symptoms   Assess patient history   Assess disturbed thought process signs and symptoms   Regulation of the environment   Hallucination management

## 2021-10-17 MED ORDER — LORAZEPAM 0.5 MG PO TAB
.25 mg | Freq: Two times a day (BID) | ORAL | 0 refills | Status: DC
Start: 2021-10-17 — End: 2021-10-20
  Administered 2021-10-17 – 2021-10-20 (×6): 0.25 mg via ORAL

## 2021-10-17 MED ORDER — OLANZAPINE 5 MG PO TAB
5 mg | Freq: Once | ORAL | 0 refills | Status: CP
Start: 2021-10-17 — End: ?
  Administered 2021-10-17: 20:00:00 5 mg via ORAL

## 2021-10-17 MED ORDER — TEMAZEPAM 7.5 MG PO CAP
30 mg | Freq: Every evening | ORAL | 0 refills | Status: DC
Start: 2021-10-17 — End: 2021-10-19
  Administered 2021-10-18 – 2021-10-19 (×2): 30 mg via ORAL

## 2021-10-17 NOTE — Behavioral Health Treatment Team
TREATMENT TEAM NOTE  Name: Zamaya Rapaport          MRN: [id number]              DOB: 1995-08-31          Age: 26 y.o.  Admission Date: 09/23/2021                 Attendees:   Psychiatrist: Arnette Norris, DO  Psychiatrist: Alfredia Ferguson, APRN  Nurse: Lucius Conn, RN  Pharmacist: Dahlia Client, PharmD  Therapist: Dewayne Hatch, [place], [place]  Therapist: Neita Carp, [place]  Case Manager: Carmela Rima, LMSW  Case Manager: Tinnie Gens, LMSW  Case Manager: April Cordero, [place]  Utilization Review: Areatha Keas, RN        Significant Points Discussed: depression=5, high anxiety; responding to internal stimuli; ongoing delusions; AVH of blood and money; up for most of the night; pacing and talking to unseen others; 1 hour of sleep       Treatment Plan Discussion: increase Abilify       Projected Discharge Date: later this week

## 2021-10-17 NOTE — Progress Notes
PRN Medication Administered:  Seroquel '100mg'$  po  Non pharmacological interventions attempted prior to PRN medication administration:  Verbal de-escalation techniques, Decrease stimuli and Increase structure and limit setting    Brief narrative of reason for PRN medication administration: Pt demanding to leave today before 5p and to talk to all members of the treatment team at once. Charge nurse, case manager, and RN informed patient she would not be leaving today as we are still working on making more progress and being safe. Pt continued to fixate on needing to leave today so she can save more people. Pt ripped off her wrist band. In cafeteria pt also turned table over. Pt taken out of milieu for 1:1 support though still has no insight on why she is unable to leave at this time. Continue to respond to internal stimuli. Will continue to monitor.

## 2021-10-17 NOTE — Group Note
Name: Shelah Heatley   MRN: [id number]     DOB: 03-02-1996      Age: 26 y.o.  Admission Date: 09/23/2021     LOS: 24 days     Date of Service: 10/17/2021      Group Topic: [place] Leisure Activities  Group Date: 10/17/2021  Start Time: 1245  End Time: 1330  Facilitators: Earl Lites          Number of Participants: 11  Group Focus: coping skills, leisure skills, relaxation, self-awareness, self-esteem, and social skills  Treatment Modality: Leisure Development  Interventions utilized were leisure development  Purpose: enhance coping skills, improve communication skills, regain self-worth, and reinforce self-care    Patients enjoyed going out to the garden area and talking with each other while sitting in the sun or shade. A few patients played ring toss.       Name: Merisa Date of Birth: [birth year]   MR: [id number]      Level of Participation: patient not present for group   Plan: to continue treatment

## 2021-10-17 NOTE — Group Note
Name: [name]   MRN: [id number]     DOB: 19-Feb-1996      Age: 26 y.o.  Admission Date: 09/23/2021     LOS: 24 days     Date of Service: 10/17/2021      Group Topic: BH Connection  Group Date: 10/17/2021  Start Time: 0915  End Time: 1000  Facilitators: Garner Nash          Number of Participants: 11  Group Focus: community group, concentration, coping skills, feeling awareness/expression, healthy friendships, leisure skills, relaxation, reminiscence, and social skills  Treatment Modality: Music Therapy  Interventions utilized were other Musical Guess Who  Purpose: enhance coping skills, express feelings, improve communication skills, and reinforce self-care    Musical Guess Who - The MT supplied the patients will pens and paper. On it, the MT had the patients write down a strength, types of music they enjoy and one song that they feel describes them. The MT then put these pieces of paper into the cup and drew one at random. The MT then read the card, played the song and afterwards invited the clients to guess who they thought selected the song. The MT then facilitated a discussion about making connections with others and why each person selected the song they did. The purpose of this intervention was to enhance social skills, self-care, leisure development, and make connections.      Name: Kristin Maynard Date of Birth: 05/13/1996   MR: [id number]      Level of Participation: moderate   Quality of Participation: cooperative, distractible and restless  Interactions with others: gave feedback and pulled from group for clinical purposes  Mood/Affect: flat and restless  Cognition: confused, not focused and responding to internal stimuli  Progress: Moderate  Response: The patient arrived late and was provided materials to actively participate within the group. The patient moderately engaged in the group; they participated in the intervention. During the group the patient was pulled for clinical purposes. The patient did not interact in discussions and seem confused when prompted for a reason they selected their song. Throughout the group, the patient was observed responding to internal stimuli.  Plan: to continue treatment

## 2021-10-17 NOTE — Progress Notes
Psychiatry Progress Note  Name: Kristin Maynard          MRN: [id number] DOB: March 20, 1996         Age: 26 y.o.  Admission Date: 09/23/2021  LOS: 24 days    Service: [facility] Adult Psych A    Principal Problem:    Schizophrenia (HCC)  Active Problems:    Pituitary macroadenoma (HCC)    Hyperprolactinemia (HCC)    Elevated BP without diagnosis of hypertension    Sinus tachycardia    Leukocytosis    PLAN  1. Continued admission for stabilization  2. Decrease Ativan to 0.25mg  BID with plans to discontinue on 4/5  3. Increase Temazepam to 30mg  qhs for insomnia  4. Discontinue Trazodone as it could be contributing to nightmares  5. Continue other medications as currently prescribed      REASONS FOR CONTINUED HOSPITALIZATION  Psychiatric stabilization and medication management.  Continues with significant psychosis.    ______________________________________________________________  SUBJECTIVE  Kristin Maynard is seen in the consult room today.  She reports that she did sleep better last night but that the voices are bothering her.  She rates the voices as 7 out of 10 today and reports they are nice and not nice.  She has been thinking a lot about money and blood and is seeing visual hallucinations of that.  She explains that every BPs with money or blood.  Last night she felt like it was her last chance to save everybody and that her imaginary mom died.  She goes on to tell that her imaginary mom was God.  Right now she reports visual hallucinations of seeing music notes.  She denies SI/HI.  At the end of her conversation she was asking about discharge today stating that she needed to go by 5 PM otherwise everybody was going to die.  She was minimally redirectable.    Zynia had 1 outburst yesterday.  She required as needed Atarax and Seroquel.  She has been perseverating on what time it is.  She has continued to be observed responding to stimuli.    REVIEW OF SYSTEMS  Review of Systems   Constitutional: Positive for malaise/fatigue. Musculoskeletal: Positive for back pain.   Skin: Negative for rash.   Neurological: Negative for dizziness and headaches.   Psychiatric/Behavioral: Positive for hallucinations. Negative for suicidal ideas. The patient is nervous/anxious and has insomnia.      ______________________________________________________________  OBJECTIVE                 Vital Signs:  Current                Vital Signs: 24 Hour Range   BP: 145/83 (04/04 0700)  Temp: 36.1 ?C (97 ?F) (04/04 0700)  Pulse: 104 (04/04 0700)  Respirations: 18 PER MINUTE (04/04 0700)  SpO2: 95 % (04/04 0700) BP: (143-145)/(83-112)   Temp:  [36.1 ?C (97 ?F)-36.2 ?C (97.2 ?F)]   Pulse:  [104-111]   Respirations:  [18 PER MINUTE]   SpO2:  [95 %-98 %]    Intensity Pain Scale (Self Report): (not recorded)      Scheduled Medications:  ARIPiprazole (ABILIFY) tablet 10 mg, 10 mg, Oral, QDAY  cabergoline (DOSTINEX) (+) tablet 0.5 mg **PATIENT OWN MED**, 0.5 mg, Oral, Once per day on Mon Thu  levonorgestrel-ethinyl estradiol (AVIANE-28, LESSINA-28) tablet 1 tablet, 1 tablet, Oral, QDAY  LORazepam (ATIVAN) tablet 0.25 mg, 0.25 mg, Oral, BID  melatonin tablet 5 mg, 5 mg, Oral, QHS  metoprolol  tartrate tablet 25 mg, 25 mg, Oral, QDAY  prazosin (MINIPRESS) capsule 3 mg, 3 mg, Oral, QHS  QUEtiapine (SEROquel) tablet 800 mg, 800 mg, Oral, QHS  temazepam (RESTORIL) capsule 30 mg, 30 mg, Oral, QHS        PRN Medications:  acetaminophen Q6H PRN 650 mg at 10/16/21 0917, calcium carbonate TID PRN, hydrOXYzine Q6H PRN 25 mg at 10/16/21 0916, QUEtiapine Q6H PRN 100 mg at 10/17/21 1121 **OR** OLANZapine Q6H PRN, ondansetron Q6H PRN 4 mg at 10/15/21 1343, polyethylene glycol 3350 QDAY PRN, senna/docusate QDAY PRN    MENTAL STATUS EXAMINATION  General/Constitutional: appears stated age, dressed appropriately, good hygiene, appears tired    Eye Contact: fair  Behavior: labile, mostly cooperative; appropriate for conversation  Speech: RRR with normal volume and tone. Good articulation  Mood: a lot better  Affect: restricted; mood congruent  Thought Process: mostly linear. Perseverative on discharge and others dying with her need to save everyone  Thought Content: denies SI, HI. Evidence of delusions   Perception: Endorses AVH  Associations: Intact  Insight/Judgment: poor/poor    Physical Exam:  Neuro: no tremors or dystonia noted      ______________________________________________________________  [name], DO

## 2021-10-17 NOTE — Progress Notes
PRN Medication Administered:  Zyprexa Zydis '5mg'$  po ONCE  Non pharmacological interventions attempted prior to PRN medication administration:  Decrease stimuli and Increase structure and limit setting    Brief narrative of reason for PRN medication administration: Pt starting to escalate again demanding to leave and be discharged today. Staff has attempted to redirect, but pt agitated and unable to comprehend why she cannot leave at this time. Pt can be heard cussing while responding internal stimuli causing disturbance among some patients.

## 2021-10-17 NOTE — Care Plan
Problem: Elopement Prevention  Goal: Patients without Decisional Capacity will not elope from the care setting  Outcome: Goal Ongoing     Problem: Mood - Altered  Goal: Stabilize mood  Outcome: Goal Ongoing  Goal: Knowledge of Altered Mood  Outcome: Goal Ongoing     Problem: Self-esteem - Low  Goal: Demonstration of positive self-esteem  Outcome: Goal Ongoing     Problem: Health Maintenance - Impaired  Goal: Able to perfom ADL's  Outcome: Goal Ongoing  Goal: Adequate nutritional intake  Outcome: Goal Ongoing  Goal: Establish therapeutic relationships  Outcome: Goal Ongoing  Goal: Knowledge of health maintenance  Outcome: Goal Ongoing

## 2021-10-17 NOTE — Progress Notes
A medication education group was performed today, and Gar Gibbon attended ~82mn of the group.  They occasionally participated in group and demonstrated minimal understanding of material discussed.    Common psychiatric medications were reviewed for indications, side effects, and onset of effect.  Methods for medication adherence were addressed with group.      The patient had no questions but was talking to herself and making hand motions during the session.    Will discuss with team in rounds.      Group Leader:  Jsaon Yoo de WDynegy

## 2021-10-17 NOTE — Progress Notes
Case Management Progress Note    NAME:Kristin Maynard MRN: [id number] DOB:August 19, 1995 AGE: 26 y.o.  ADMISSION DATE: 09/23/2021 DAYS ADMITTED: LOS: 24 days     Date of Service: 10/17/2021  Service Start Time:  11:52am  Service End Time:  11:55am    CM met with patient, along with patient's assigned RN and Agricultural consultant, per patient's request. Patient states she "needs to get out of here by 5pm". CM informed patient that she will not be discharging today, as the treatment team feels she isn't ready. Patient became agitated, making more bizarre and delusional statements. CM and Nursing ended conversation, as patient was unable to verbalize understanding. CM to continue to meet with patient as needed to work on discharge and safety planning.

## 2021-10-17 NOTE — Care Coordination-Inpatient
Care Coordination - Inpatient Note      Patient Name:   Kristin Maynard                        MRN:  [id number]  Admission Date:  09/23/2021    1:42-1:47pm  CM spoke with patient's father/DPOA, Corky Downs [phone]), to provide an update on discharge. CM informed [name] that the patient will not be discharging today, and will hopefully be ready closer to the end of the week. [name] states he saw the patient on Sunday. Per [name], "It started out all good. Then all the sudden she closed her eyes and started scribbling on the table telling us she was writing messages from the voices". CM and [name] processed further. [name] reports the patient is now telling providers "80%" of the truth. [name] states he continued to encourage the patient to relay "100%" of her thoughts, but patient asked him and her brother to leave. CM to continue talking to [place] to provide updates on patient's progress and tentative discharge.     Carmela Rima  10/17/2021

## 2021-10-17 NOTE — Progress Notes
PRN Medication Administered:  Seroquel '100mg'$  PO  Non pharmacological interventions attempted prior to PRN medication administration:  Decrease stimuli, Reorient, remind pt she is safe here    Brief narrative of reason for PRN medication administration: Juliett had been talking with unseen others throughout the night, occasionally coming out in the hall to ask the time. Around 0332 she began screaming, "make it stop!!", in reference to the [facility]. When asked, pt replied there are multiple voices and they are scaring her. Pt is now resting in bed.

## 2021-10-17 NOTE — Group Note
Name: Kristin Maynard   MRN: [id number]     DOB: 1995/10/14      Age: 26 y.o.  Admission Date: 09/23/2021     LOS: 24 days     Date of Service: 10/17/2021      Group Topic: BH Positive Problem Solving  Group Date: 10/17/2021  Start Time: 9150  End Time: 1100  Facilitators: Candie Chroman          Number of Participants: 12  Group Focus: communication and feeling awareness/expression  Treatment Modality: Art Therapy  Interventions utilized were assignment and exploration  Purpose: enhance coping skills and express feelings    Patients were encouraged to explore irrational fears through a guided drawing exercise inspired by the artist Laney Potash. The goal of the activity was to increase comfort and confidence with creative expression, and to decrease judgement and anxiety related to perceived artistic ability.          Name: Kristin Maynard Date of Birth: 02/12/1996   MR: [id number]      Level of Participation: Present in the milieu responding to internal stimuli   Quality of Participation: disruptive and told peer that he should tear up his artwork  Interactions with others: minimal- agitated  Mood/Affect: agitated  Triggers (if applicable): Unsure  Cognition: no insight and not focused  Progress: None  Response: Patient accepted materials and briefly engaged before tearing her paper up. Spent the majority of group talking to herself in an angry tone.   Plan: to continue treatment

## 2021-10-17 NOTE — Progress Notes
PRN Medication Administered:  Atarax '25mg'$  po  Non pharmacological interventions attempted prior to PRN medication administration:  Verbal de-escalation techniques, Decrease stimuli and Increase structure and limit setting    Brief narrative of reason for PRN medication administration: Pt continues to worry about not being able to save people as she claims she is a god. States some of [place] will not exist anymore if she does not get out of here. 1:1 support provided.

## 2021-10-17 NOTE — Care Plan
Problem: Mood - Altered  Goal: Stabilize mood  Flowsheets (Taken 10/16/2021 2243)  Stabilize mood:   Assess depressive symptoms   Assess depression history   Establish therapeutic relationships   Assess coping style   Assess ineffective coping signs and symptoms     Problem: Thought Process - Altered  Goal: Demonstration of organized thought processes  Outcome: Goal Ongoing  Flowsheets (Taken 10/16/2021 2243)  Demonstration of organized thought processes:   Assess cognitive ability   Assess risk for impaired cognition   Assess disturbed thought process signs and symptoms   Assess thought process   Provide environmental regulation - agitation   Assess in reality orientation     Problem: Health Maintenance - Impaired  Goal: Able to perfom ADL's  Outcome: Goal Ongoing  Flowsheets (Taken 10/16/2021 2243)  Able to perform ADLs:   Assess nutritional intake   Assess for pain

## 2021-10-18 MED ORDER — OLANZAPINE 5 MG PO TBDI
5 mg | ORAL | 0 refills | Status: DC | PRN
Start: 2021-10-18 — End: 2021-10-23
  Administered 2021-10-18 – 2021-10-23 (×4): 5 mg via ORAL

## 2021-10-18 MED ORDER — OLANZAPINE 10 MG IM SOLR
5 mg | INTRAMUSCULAR | 0 refills | Status: DC | PRN
Start: 2021-10-18 — End: 2021-10-23

## 2021-10-18 MED ORDER — ARIPIPRAZOLE 10 MG PO TAB
20 mg | Freq: Every day | ORAL | 0 refills | Status: DC
Start: 2021-10-18 — End: 2021-10-20
  Administered 2021-10-19 – 2021-10-20 (×2): 20 mg via ORAL

## 2021-10-18 NOTE — Group Note
Name: Kristin Maynard   MRN: [id number]     DOB: 1995-09-22      Age: 26 y.o.  Admission Date: 09/23/2021     LOS: 25 days     Date of Service: 10/18/2021      Group Topic: BH Coping Skills  Group Date: 10/18/2021  Start Time: 1015  End Time: 1100  Facilitators: Harrell Lark          Number of Participants: 13  Group Focus: communication, concentration, coping skills, feeling awareness/expression, leisure skills, music therapy, problem solving, relaxation, reminiscence, and social skills  Treatment Modality: Music Therapy  Interventions utilized were other Coping Skills Music  Purpose: enhance coping skills, express feelings, improve communication skills, and reinforce self-care    Coping Skills Music: The therapist provided patients with a list of coping skills and blank paper. Patients were encouraged to mark down coping skills that they enjoy/have tried or want to try. On the back side of the paper, patients listed songs that they can listen to as a coping skill. While working on [employer], patients requested specific songs from their list to listen to. The therapist then facilitated a discussion about the different coping skills patients would like to try or currently do. The group also discussed the benefits of listening to music to cope.     Name: [name] Date of Birth: February 06, 1996   MR: [id number]      Level of Participation: moderate   Quality of Participation: cooperative and restless  Interactions with others: gave feedback  Mood/Affect: agitated and flat  Cognition: Responding to internal stimuli  Progress: Moderate  Response: The patient moderately participated within the group. The patient worked on worksheet and interacted with the therapist when prompted. Throughout the group, the patient was responding to internal stimuli; at one point becoming agitated with this stimuli.  Plan: to continue treatment

## 2021-10-18 NOTE — Group Note
Name: Glory Graefe   MRN: [id number]     DOB: [birth year]      Age: 26 y.o.  Admission Date: 09/23/2021     LOS: 25 days     Date of Service: 10/18/2021      Group Topic: [place] Leisure Activities  Group Date: 10/18/2021  Start Time: 0915  End Time: 1000  Facilitators: Earl Lites          Number of Participants: 13  Group Focus: coping skills, leisure skills, self-awareness, self-esteem, and social skills  Treatment Modality: Recreation Therapy  Interventions utilized were group exercise and leisure development  Purpose: enhance coping skills, improve communication skills, regain self-worth, and reinforce self-care    Patients exercised to music. Patients played the game "Sorry." Anytime a patient drew a "Sorry" card the whole table answered a question related to positive psychology. Questions discussed positive tools to help [place] cope effectively including gratitude, acts of kindness and leisure activities we enjoy.       Name: Musette Date of Birth: February 25, 1996   MR: [id number]      Level of Participation: moderate   Quality of Participation: attentive and cooperative  Interactions with others: gave feedback  Mood/Affect: blunted and flat  Cognition: goal directed when in group, patient left group for a while and then returned.  Progress: Moderate  Plan: to continue treatment

## 2021-10-18 NOTE — Group Note
Name: Kristin Maynard   MRN: [id number]     DOB: December 19, 1995      Age: 26 y.o.  Admission Date: 09/23/2021     LOS: 25 days     Date of Service: 10/18/2021      Group Topic: BH Emotion Regulation (DBT Skill)  Group Date: 10/18/2021  Start Time: 1100  End Time: 1150  Facilitators: Marrion Coy          Number of Participants: 10  Group Focus: feeling awareness/expression and self-awareness  Treatment Modality: Psychoeducation  Interventions utilized were exploration and patient education  Purpose: enhance coping skills, express feelings, increase insight, and trigger / craving management          Name: Kristin Maynard Date of Birth: 11-Nov-1995   MR: [id number]      Level of Participation: moderate   Quality of Participation: cooperative, quiet and withdrawn  Interactions with others: interacted minimally with group members  Mood/Affect: blunted, euthymic and incongruent  Triggers (if applicable): none observed   Cognition: not focused  Progress: Minimal  Response: Patient reported her mood as "happy," which she attributed to discharging later today. She appeared to be responding to internal stimuli during group (e.g., talking quietly to herself). She was somewhat attentive to group discussion of emotion identification/expression.   Plan: to continue treatment

## 2021-10-18 NOTE — Care Plan
Problem: Mood - Altered  Goal: Stabilize mood  Outcome: Goal Ongoing  Goal: Knowledge of Altered Mood  Outcome: Goal Ongoing     Problem: Self-esteem - Low  Goal: Demonstration of positive self-esteem  Outcome: Goal Ongoing     Problem: Thought Process - Altered  Goal: Demonstration of organized thought processes  Outcome: Goal Ongoing     Problem: Cognitive-Perceptual Pattern - Impaired  Goal: Demonstration of organized thought processes  Outcome: Goal Ongoing  Goal: Knowledge of prescribed medication  Outcome: Goal Ongoing     Problem: Coping - Ineffective, Family  Goal: Effective Coping  Outcome: Goal Ongoing  Goal: Communicate with support staff  Outcome: Goal Ongoing  Goal: Knowledge of positive coping methods  Outcome: Goal Ongoing

## 2021-10-18 NOTE — Behavioral Health Treatment Team
TREATMENT TEAM NOTE  Name: Lynnett Langlinais          MRN: [id number]              DOB: April 18, 1996          Age: 26 y.o.  Admission Date: 09/23/2021                 Attendees:   Psychiatrist: Arnette Norris, DO  Nurse Supervisor: Cyndia Skeeters, RN, BSN  Nurse: Dede Query, RN  Pharmacist: Dahlia Client, PharmD  Therapist: Dewayne Hatch, [place], [facility]  Case Manager: Carmela Rima, LMSW  Case Manager: Tinnie Gens, LMSW  Case Manager: April Cordero, [place]  Utilization Review: Areatha Keas, RN    Significant Points Discussed: Anxiety=9. Depression 8. HI towards 'anybody that hurts the good voice.' Responding to internal stimuli. Fixated on discharge. Didn't sleep well.      Treatment Plan Discussion: Has med appointment tomorrow to be rescheduled.      Projected Discharge Date: TBD

## 2021-10-18 NOTE — Progress Notes
PRN Medication Administered:  Zyprexa Zydis '5mg'$  po  Non pharmacological interventions attempted prior to PRN medication administration:  Decrease stimuli and Increase structure and limit setting    Brief narrative of reason for PRN medication administration: Pt observed pacing around room internally agitated and cussing at unseen others.

## 2021-10-18 NOTE — Progress Notes
PRN Medication Administered:  Ataras '25mg'$  po  Non pharmacological interventions attempted prior to PRN medication administration:  Decrease stimuli and Increase structure and limit setting    Brief narrative of reason for PRN medication administration: Pt feels worried because she believes [place] will be coming after her and can hear him breaking free.

## 2021-10-18 NOTE — Progress Notes
Psychiatry Progress Note  Name: Kristin Maynard          MRN: [id number] DOB: [birth year]         Age: 26 y.o.  Admission Date: 09/23/2021  LOS: 25 days    Service: [facility] Adult Psych A    Principal Problem:    Schizophrenia (HCC)  Active Problems:    Pituitary macroadenoma (HCC)    Hyperprolactinemia (HCC)    Elevated BP without diagnosis of hypertension    Sinus tachycardia    Leukocytosis    PLAN  1. Continued admission for stabilization  2. Increase Abilify to 20mg  starting on 4/6  3. Will d/c seroquel as PRN and initiate zyprexa q6h PRN for agitation/psychosis as patient has had a better response with zyprexa  4. Continue other medications as currently prescribed      REASONS FOR CONTINUED HOSPITALIZATION  Psychiatric stabilization and medication management.  Continues with significant psychosis.    ______________________________________________________________  SUBJECTIVE  Kristin Maynard is seen in the consult room today.  She had just finished showering and getting ready for the day.  Her night last night was better than the previous one she reported.  She continues to have nightmares that involve evil and good voices.  She denies hearing any hallucinations today.  She describes her mood is good.  She does not feel the need to save everyone today.  She describes this as if she is not able to save herself then she would be able to save others.  She thinks baby Kristin Maynard has grown up and that there are multiple babies now.  She denies SI.  She has been seeing HI of Kristin Maynard the evil God of death. She reports VH of a hydra.     She required multiple as needed medications yesterday when she was told that she would not be going home.  She required Zyprexa, Atarax, Seroquel.  She has been expressing HI towards those that hurt the good voice.  She has continued to be as a responding to stimuli.  She slept for documented 2 hours last night.    REVIEW OF SYSTEMS  Review of Systems   Gastrointestinal: Negative for constipation, diarrhea and vomiting.   Musculoskeletal: Positive for back pain.   Skin: Negative for rash.   Neurological: Negative for dizziness and headaches.   Psychiatric/Behavioral: Positive for hallucinations. Negative for suicidal ideas. The patient is nervous/anxious and has insomnia.      ______________________________________________________________  OBJECTIVE                 Vital Signs:  Current                Vital Signs: 24 Hour Range   BP: 141/99 (04/05 0800)  Temp: 36.3 ?C (97.3 ?F) (04/05 0800)  Pulse: 106 (04/05 0800)  Respirations: 18 PER MINUTE (04/05 0800)  SpO2: 98 % (04/05 0800) BP: (141-150)/(95-99)   Temp:  [36.3 ?C (97.3 ?F)-36.6 ?C (97.9 ?F)]   Pulse:  [106-138]   Respirations:  [18 PER MINUTE]   SpO2:  [96 %-98 %]    Intensity Pain Scale (Self Report): (not recorded)      Scheduled Medications:  [START ON 10/19/2021] ARIPiprazole (ABILIFY) tablet 20 mg, 20 mg, Oral, QDAY  cabergoline (DOSTINEX) (+) tablet 0.5 mg **PATIENT OWN MED**, 0.5 mg, Oral, Once per day on Mon Thu  levonorgestrel-ethinyl estradiol (AVIANE-28, LESSINA-28) tablet 1 tablet, 1 tablet, Oral, QDAY  LORazepam (ATIVAN) tablet 0.25 mg, 0.25 mg, Oral, BID  melatonin  tablet 5 mg, 5 mg, Oral, QHS  metoprolol tartrate tablet 25 mg, 25 mg, Oral, QDAY  prazosin (MINIPRESS) capsule 3 mg, 3 mg, Oral, QHS  QUEtiapine (SEROquel) tablet 800 mg, 800 mg, Oral, QHS  temazepam (RESTORIL) capsule 30 mg, 30 mg, Oral, QHS        PRN Medications:  acetaminophen Q6H PRN 650 mg at 10/16/21 0917, calcium carbonate TID PRN, hydrOXYzine Q6H PRN 25 mg at 10/18/21 0833, OLANZapine Q6H PRN **OR** OLANZapine Q6H PRN, ondansetron Q6H PRN 4 mg at 10/15/21 1343, polyethylene glycol 3350 QDAY PRN, senna/docusate QDAY PRN    MENTAL STATUS EXAMINATION  General/Constitutional: appears stated age, dressed appropriately, good hygiene   Eye Contact: good  Behavior: Calm, cooperative; appropriate for conversation  Speech: RRR with normal volume and tone. Good articulation  Mood: better  Affect: restricted; mood congruent  Thought Process: Linear and goal directed  Thought Content: denies SI, endorses HI. Continued delusions regarding baby Kristin Maynard and also saving the world   Perception: Denies AH and reports VH  Associations: Intact  Insight/Judgment: poor-improving/fluctuating     Physical Exam:  Gait: Ambulates without difficulty. Normal arm swing     ______________________________________________________________  [name], DO

## 2021-10-18 NOTE — Care Plan
Problem: Mood - Altered  Goal: Stabilize mood  Flowsheets (Taken 10/17/2021 2343)  Stabilize mood:   Assess coping style   Assess ineffective coping signs and symptoms   Establish therapeutic relationships     Problem: Thought Process - Altered  Goal: Demonstration of organized thought processes  Flowsheets (Taken 10/17/2021 2343)  Demonstration of organized thought processes:   Assess cognitive ability   Assess thought process   Provide environmental regulation - agitation   Assess in reality orientation     Problem: Coping - Ineffective, Family  Goal: Effective Coping  Flowsheets (Taken 10/17/2021 2343)  Effective coping:   Assess support system   Provide coping support

## 2021-10-18 NOTE — Group Note
Name: Kristin Maynard   MRN: [id number]     DOB: [birth year]      Age: 26 y.o.  Admission Date: 09/23/2021     LOS: 25 days     Date of Service: 10/18/2021      Group Topic: BH Coping Skills  Group Date: 10/18/2021  Start Time: 1300  End Time: [place]  Facilitators: Candie Chroman          Number of Participants: 12  Group Focus: coping skills  Treatment Modality: Art Therapy  Interventions utilized were assignment and exploration  Purpose: enhance coping skills and express feelings    Group members were provided with a variety of papers, glue sticks, and oil pastels and encouraged to create an abstract collage about something they enjoy. Activity is intended to promote creative expression, problem solving, coping skills, and healthy leisure interests.             Name: Kristin Maynard Date of Birth: 02-05-1996   MR: [id number]      Level of Participation: active   Quality of Participation: disruptive  Interactions with others: did not interact  Mood/Affect: agitated  Triggers (if applicable): NA  Cognition: religious preoccupation and responding to internal stimuli  Progress: Minimal  Response: Engaged in art making and responding to internal stimuli in angry tone.  Wrote "screw your sacrifice" and "god" all over her paper. Requested to speak with discharge team.   Plan: to continue treatment

## 2021-10-19 MED ORDER — TEMAZEPAM 7.5 MG PO CAP
15 mg | Freq: Every evening | ORAL | 0 refills | Status: DC
Start: 2021-10-19 — End: 2021-10-20
  Administered 2021-10-20: 02:00:00 15 mg via ORAL

## 2021-10-19 MED ORDER — DIVALPROEX 500 MG PO TB24
1500 mg | Freq: Every evening | ORAL | 0 refills | Status: DC
Start: 2021-10-19 — End: 2021-11-02
  Administered 2021-10-20 – 2021-11-02 (×14): 1500 mg via ORAL

## 2021-10-19 NOTE — Group Note
Name: Kristin Maynard   MRN: [id number]     DOB: 05-26-1996      Age: 26 y.o.  Admission Date: 09/23/2021     LOS: 26 days     Date of Service: 10/19/2021      Group Topic: BH Coping Skills  Group Date: 10/19/2021  Start Time: 0915  End Time: 1015  Facilitators: Harrell Lark          Number of Participants: 17  Group Focus: communication, coping skills, feeling awareness/expression, leisure skills, music therapy, relaxation, reminiscence, and social skills  Treatment Modality: Music Therapy  Interventions utilized were other Lyric Poems  Purpose: enhance coping skills, express feelings, improve communication skills, and reinforce self-care    Lyric Poems -The MT provided the patients with construction paper, portions of preselected song lyrics cut into strips or phrases, and a marker. The MT then instructed the clients to arrange their given strips into any order they feel expresses themselves.  After arranging the strips into their desired layout, the MT provided the clients with tape to secure them. While completing their poems, the therapist played patient preferred music.  Patients then shared with the group their lyric scramblers. The purpose of this intervention is to enhance coping skills, self-care, express feelings, attention skills, and leisure skills      Name: Kristin Maynard Date of Birth: 1996-02-21   MR: [id number]      Level of Participation: active   Quality of Participation: attentive, cooperative and engaged  Interactions with others: gave feedback  Mood/Affect: flat  Cognition: goal directed and Responding to internal stimuli  Progress: Moderate  Response: The patient actively participated within the group. The patient completed and shared multiple lyric poems. Patient expressed enjoyment of the group. Throughout the group, the patient was responding to internal stimuli.  Plan: to continue treatment

## 2021-10-19 NOTE — Group Note
Name: Kristin Maynard   MRN: [id number]     DOB: [birth year]      Age: 26 y.o.  Admission Date: 09/23/2021     LOS: 26 days     Date of Service: 10/19/2021      Group Topic: [place] Leisure Activities  Group Date: 10/19/2021  Start Time: 1350  End Time: 1445  Facilitators: Earl Lites          Number of Participants: 10  Group Focus: coping skills, healthy friendships, leisure skills, self-esteem, and social skills  Treatment Modality: Recreation Therapy  Interventions utilized were group exercise and leisure development  Purpose: enhance coping skills, regain self-worth, and reinforce self-care    Patients enjoyed playing a game called "Buncho." Two teams of 2, sit at a table and all tables are rolling the same number with dice. After six games, the group finds out who rolled the most Buncho's, Wins and Loses. This is a good game for all ages, persons with the use of only one hand, great for working on left or right side neglect after a stroke, speak different languages and just to have fun and get together with family and friends. Patients also get exercise because when their team loses, they move to a different table after each game.       Name: Kristin Maynard Date of Birth: 1995/12/07   MR: [id number]      Level of Participation: active   Quality of Participation: attentive and cooperative  Interactions with others: gave feedback  Mood/Affect: appropriate  Cognition: goal directed  Progress: Moderate  Response: Patient participated fully in the game and seemed to have fun. Patient's affect wasn't as flat.   Plan: to continue treatment

## 2021-10-19 NOTE — Care Plan
Problem: Health Maintenance - Impaired  Goal: Knowledge of health maintenance  Outcome: Goal Ongoing     Problem: Cognitive-Perceptual Pattern - Impaired  Goal: Knowledge of prescribed medication  Outcome: Goal Ongoing     Problem: Coping - Ineffective, Family  Goal: Effective Coping  Outcome: Goal Ongoing

## 2021-10-19 NOTE — Group Note
Name: Kristin Maynard   MRN: [id number]     DOB: [birth year]      Age: 26 y.o.  Admission Date: 09/23/2021     LOS: 26 days     Date of Service: 10/19/2021      Group Topic: BH Emotion Regulation (DBT Skill)  Group Date: 10/19/2021  Start Time: 1245  End Time: 1345  Facilitators: Candie Chroman          Number of Participants: 12  Group Focus: feeling awareness/expression  Treatment Modality: Art Therapy  Interventions utilized were assignment and exploration  Purpose: enhance coping skills and express feelings    Group members were encouraged to use watercolor to explore feeling identification and affect regulation through images of landscapes and weather.          Name: Kristin Maynard Date of Birth: 1995-11-27   MR: [id number]      Level of Participation: active   Quality of Participation: cooperative  Interactions with others: responded to therapist when spoken to  Mood/Affect: flat  Triggers (if applicable): NA  Cognition: rigid  Progress: Minimal  Response: Patient engaged in painting and interacting with internal stimuli  Plan: to continue treatment

## 2021-10-19 NOTE — Progress Notes
Psychiatry Progress Note  Name: Kristin Maynard          MRN: [id number] DOB: 06-30-1996         Age: 26 y.o.  Admission Date: 09/23/2021  LOS: 26 days    Service: [facility] Adult Psych A    Principal Problem:    Schizophrenia (HCC)  Active Problems:    Pituitary macroadenoma (HCC)    Hyperprolactinemia (HCC)    Elevated BP without diagnosis of hypertension    Sinus tachycardia    Leukocytosis  R/o bipolar mood disorder, current episode manic with psychotic features       PLAN  1. Continued admission for stabilization  2. Increase Abilify to 20mg  starting on 4/6  3. Initiate Depakote ER 1500mg  nightly for mood stabilization and sleep  4. Decrease Temazepam to 15mg  nightly   5. Continue other medications as currently prescribed      REASONS FOR CONTINUED HOSPITALIZATION  Psychiatric stabilization and medication management.  Continues with significant psychosis and has been sleeping from 0 to 3 hrs nightly. Patient is not stable at this time for discharge back to home and if were discharged would likely require hospitalization again in the near future.     ______________________________________________________________  SUBJECTIVE  Kristin Maynard is seen in the consult room today. She was observed out in the day room moving her hands in the air as if she were turning an imaginary door.  She reported that her night was not very good last night and continues to think that nightmares are waking her up.  She notes that she barely slept last night but states that she is not tired overall today.  She acknowledges that she has not slept more than a couple hours in several days.  She feels like the devil is after her today and reports that it is the full devil not the small devil.  She does feel protected.  She denies SI/HI/AH.  She reports visual hallucinations of a hydra, multiple.    Vashon required as needed Atarax and Zyprexa yesterday.  She slept for documented 1 hour and had restless sleep.She has continued to be observed responding to stimuli     REVIEW OF SYSTEMS  Review of Systems   Gastrointestinal: Negative for constipation, diarrhea and vomiting.   Musculoskeletal: Positive for back pain.   Skin: Negative for rash.   Neurological: Negative for dizziness and headaches.   Psychiatric/Behavioral: Positive for hallucinations. Negative for suicidal ideas. The patient is nervous/anxious and has insomnia.      ______________________________________________________________  OBJECTIVE                 Vital Signs:  Current                Vital Signs: 24 Hour Range   BP: 145/95 (04/06 0800)  Temp: 36.2 ?C (97.2 ?F) (04/06 0800)  Pulse: 106 (04/06 0800)  Respirations: 16 PER MINUTE (04/06 0800)  SpO2: 98 % (04/06 0800) BP: (117-145)/(88-95)   Temp:  [36.2 ?C (97.2 ?F)-36.3 ?C (97.3 ?F)]   Pulse:  [106-126]   Respirations:  [16 PER MINUTE-20 PER MINUTE]   SpO2:  [98 %]    Intensity Pain Scale (Self Report): (not recorded)      Scheduled Medications:  ARIPiprazole (ABILIFY) tablet 20 mg, 20 mg, Oral, QDAY  cabergoline (DOSTINEX) (+) tablet 0.5 mg **PATIENT OWN MED**, 0.5 mg, Oral, Once per day on Mon Thu  divalproex (DEPAKOTE ER) ER tablet 1,500 mg, 1,500 mg, Oral, QHS  levonorgestrel-ethinyl estradiol (AVIANE-28, LESSINA-28) tablet 1 tablet, 1 tablet, Oral, QDAY  LORazepam (ATIVAN) tablet 0.25 mg, 0.25 mg, Oral, BID  melatonin tablet 5 mg, 5 mg, Oral, QHS  metoprolol tartrate tablet 25 mg, 25 mg, Oral, QDAY  prazosin (MINIPRESS) capsule 3 mg, 3 mg, Oral, QHS  QUEtiapine (SEROquel) tablet 800 mg, 800 mg, Oral, QHS  temazepam (RESTORIL) capsule 15 mg, 15 mg, Oral, QHS        PRN Medications:  acetaminophen Q6H PRN 650 mg at 10/16/21 0917, calcium carbonate TID PRN, hydrOXYzine Q6H PRN 25 mg at 10/18/21 0833, OLANZapine Q6H PRN 5 mg at 10/18/21 1539 **OR** OLANZapine Q6H PRN, ondansetron Q6H PRN 4 mg at 10/15/21 1343, polyethylene glycol 3350 QDAY PRN, senna/docusate QDAY PRN    MENTAL STATUS EXAMINATION  General/Constitutional: appears stated age, dressed appropriately, good hygiene   Eye Contact: intermittent  Behavior: Calm, cooperative; appropriate for conversation  Speech: RRR with normal volume and tone. Good articulation  Mood: really good  Affect: restricted; mood incongruent  Thought Process: Linear and goal directed  Thought Content: denies SI, denies HI. Continued delusions  Perception: Denies AH and reports VH  Associations: Intact  Insight/Judgment: poor-improving/fluctuating     Physical Exam:  Gait: Ambulates without difficulty. Normal arm swing     ______________________________________________________________  [name], DO

## 2021-10-19 NOTE — Care Plan
Problem: Mood - Altered  Goal: Stabilize mood  Flowsheets (Taken 10/17/2021 2343)  Stabilize mood:   Assess coping style   Assess ineffective coping signs and symptoms   Establish therapeutic relationships     Problem: Thought Process - Altered  Goal: Demonstration of organized thought processes  Flowsheets (Taken 10/17/2021 2343)  Demonstration of organized thought processes:   Assess cognitive ability   Assess thought process   Provide environmental regulation - agitation   Assess in reality orientation     Problem: Coping - Ineffective, Family  Goal: Effective Coping  Flowsheets (Taken 10/17/2021 2343)  Effective coping:   Assess support system   Provide coping support

## 2021-10-19 NOTE — Group Note
Name: Atarah Cadogan   MRN: [id number]     DOB: Dec 14, 1995      Age: 26 y.o.  Admission Date: 09/23/2021     LOS: 26 days     Date of Service: 10/19/2021      Group Topic: BH Connection  Group Date: 10/19/2021  Start Time: 1105  End Time: 1200  Facilitators: Dewayne Hatch          Number of Participants: 13  Group Focus: anxiety, coping skills, depression, diagnosis education, feeling awareness/expression, loss/grief issues, personal responsibility, and relapse prevention  Treatment Modality: Patient-Centered Therapy and Psychoeducation  Interventions utilized were clarification, exploration, patient education, and problem solving  Purpose: enhance coping skills, express feelings, increase insight, relapse prevention strategies, and trigger / craving management    Provider asked group for topics to discuss during group that relate to their mental health or mental health as a larger topic. Provider lead group in discussion of the following topics:   Anxiety and Depression   Mania   Racing Thoughts          Name: Lyzbeth Date of Birth: [birth year]   MR: [id number]      Level of Participation: minimal   Quality of Participation: quiet and withdrawn  Interactions with others: did not interact  Mood/Affect: flat  Triggers (if applicable): Pt appeared to be responding to internal stimuli.  Cognition: not focused, religious preoccupation and tracking difficulty  Progress: None  Response: Present.  Plan: to continue treatment

## 2021-10-19 NOTE — Care Coordination-Inpatient
Care Coordination - Inpatient Note      Patient Name:   Kristin Maynard                        MRN:  [id number]  Admission Date:  09/23/2021    12:54-12:56pm  CM contacted the [facility] in [place] [phone]) to reschedule patient's aftercare services, as she is not discharging in time for her appointment tomorrow. Patient's appointments were cancelled. CM to reach out and reschedule when there is an identified discharge date for patient.     Carmela Rima  10/19/2021

## 2021-10-19 NOTE — Behavioral Health Treatment Team
TREATMENT TEAM NOTE  Name: Kristin Maynard          MRN: [id number]              DOB: [birth year]          Age: 27 y.o.  Admission Date: 09/23/2021                 Attendees:   Psychiatrist: Arnette Norris, DO  Psychiatrist: Alfredia Ferguson, APRN  Nurse Supervisor: Cyndia Skeeters, RN, BSN  Nurse: Lucius Conn, RN  Pharmacist: Dahlia Client, PharmD  Therapist: Dewayne Hatch, [place], [place]  Therapist: Neita Carp, [place]  Case Manager: Tinnie Gens, LMSW  Case Manager: April Cordero, [place]  Utilization Review: Areatha Keas, RN        Significant Points Discussed: anxiety=2, depression=1; denies SI/HI; seeing the grim reaper; medication compliant; poor sleep       Treatment Plan Discussion: continue current treatment plan       Projected Discharge Date: next week

## 2021-10-20 MED ORDER — TEMAZEPAM 7.5 MG PO CAP
30 mg | Freq: Every evening | ORAL | 0 refills | Status: DC
Start: 2021-10-20 — End: 2021-10-23
  Administered 2021-10-21 – 2021-10-23 (×3): 30 mg via ORAL

## 2021-10-20 MED ORDER — ARIPIPRAZOLE 15 MG PO TAB
30 mg | Freq: Every day | ORAL | 0 refills | Status: DC
Start: 2021-10-20 — End: 2021-10-23
  Administered 2021-10-21 – 2021-10-23 (×3): 30 mg via ORAL

## 2021-10-20 NOTE — Care Plan
Problem: Mood - Altered  Goal: Stabilize mood  Flowsheets (Taken 10/17/2021 2343)  Stabilize mood:   Assess coping style   Assess ineffective coping signs and symptoms   Establish therapeutic relationships     Problem: Thought Process - Altered  Goal: Demonstration of organized thought processes  Flowsheets (Taken 10/17/2021 2343)  Demonstration of organized thought processes:   Assess cognitive ability   Assess thought process   Provide environmental regulation - agitation   Assess in reality orientation     Problem: Coping - Ineffective, Family  Goal: Effective Coping  Flowsheets (Taken 10/17/2021 2343)  Effective coping:   Assess support system   Provide coping support

## 2021-10-20 NOTE — Group Note
Name: Jaelen Gellerman   MRN: [id number]     DOB: [birth year]      Age: 26 y.o.  Admission Date: 09/23/2021     LOS: 27 days     Date of Service: 10/20/2021      Group Topic: BH Emotion Regulation (DBT Skill)  Group Date: 10/20/2021  Start Time: 0900  End Time: 1000  Facilitators: Candie Chroman          Number of Participants: 9  Group Focus: feeling awareness/expression  Treatment Modality: Art Therapy  Interventions utilized were assignment and exploration  Purpose: enhance coping skills and express feelings    Group members were encouraged to experiment with mark making using acrylic paint and nontraditional painting tools inspired by the Action Art movement. Group members were instructed to focus on the physical and sensory aspects of this type of expression, and observe any changes in mood or energy as a result of engaging in the process.        Name: Blair Date of Birth: 07-Aug-1995   MR: [id number]      Level of Participation: active   Quality of Participation: engaged in art making while actively responding to internal stimuli  Interactions with others: Gave feedback  Mood/Affect: restless  Triggers (if applicable): NA  Cognition: religious preoccupation  Progress: Moderate  Response: Fully participated and seemed less agitated with internal stimuli  Plan: to continue treatment

## 2021-10-20 NOTE — Care Coordination-Inpatient
Care Coordination - Inpatient Note      Patient Name:   Kristin Maynard                        MRN:  [id number]  Admission Date:  09/23/2021    5-5:04pm  CM spoke with patient's father/DPOA, Corky Downs [phone]), to provide an update on patient's progress. CM and [name] discussed patient's progress and remaining symptoms. [name] states the patient called earlier and was able to verbalize still needing 2-3 more days at the hospital. [name] reports he plans on visiting the patient this weekend. CM to follow-up as needed.     5:05pm  CM attempted to contact the [facility] [phone]) to discuss tentative discharge plans. No answer. CM left voicemail with return callback info. Will follow-up on Monday.     Carmela Rima  10/20/2021

## 2021-10-20 NOTE — Group Note
Name: Kristin Maynard   MRN: [id number]     DOB: [birth year]      Age: 25 y.o.  Admission Date: 09/23/2021     LOS: 27 days     Date of Service: 10/20/2021      Group Topic: [place] Emotion Regulation (DBT Skill)  Group Date: 10/20/2021  Start Time: 1400  End Time: 1450  Facilitators: Candie Chroman          Number of Participants: 8  Group Focus: feeling awareness/expression  Treatment Modality: Art Therapy  Interventions utilized were assignment and exploration  Purpose: enhance coping skills and express feelings    Group members experimented with clay to explore feeling identification and affect regulation          Name: Kristin Maynard Date of Birth: 09-27-1995   MR: [id number]      Level of Participation: active   Quality of Participation: cooperative and engaged  Interactions with others: gave feedback  Mood/Affect: closed / guarded and flat  Triggers (if applicable): NA  Cognition: concrete  Progress: Moderate  Response: Engaged in art making and responding to internal stimuli. Did not seem as agitated as in previous groups.  Plan: to continue treatment

## 2021-10-20 NOTE — Behavioral Health Treatment Team
TREATMENT TEAM NOTE  Name: Kristin Maynard          MRN: [id number]              DOB: 29-Mar-1996          Age: 26 y.o.  Admission Date: 09/23/2021                 Attendees:   Psychiatrist: Arnette Norris, DO  Psychiatrist: Alfredia Ferguson, APRN  Nurse Supervisor: Cyndia Skeeters, RN, BSN  Nurse: Dede Query, RN  Pharmacist: Dahlia Client, PharmD  Therapist: Dewayne Hatch, [place], [place]  Therapist: Neita Carp, [place]  Case Manager: Carmela Rima, LMSW  Case Manager: Tinnie Gens, LMSW  Case Manager: April Cordero, [place]  Utilization Review: Areatha Keas, RN        Significant Points Discussed: anxiety=3, depression=2; endorsing AH; denies SI; responding to internal stimuli but not yelling and calmer; no anger outbursts      Treatment Plan Discussion: continue medication adjustments       Projected Discharge Date: next week

## 2021-10-20 NOTE — Progress Notes
Psychiatry Progress Note  Name: Kristin Maynard          MRN: [id number] DOB: [birth year]         Age: 26 y.o.  Admission Date: 09/23/2021  LOS: 27 days    Service: [facility] Adult [facility]    Principal Problem:    Schizophrenia (HCC)  Active Problems:    Pituitary macroadenoma (HCC)    Hyperprolactinemia (HCC)    Elevated BP without diagnosis of hypertension    Sinus tachycardia    Leukocytosis  R/o bipolar mood disorder, current episode manic with psychotic features       PLAN  1. Continued admission for stabilization  2. Increase Abilify to 30mg  starting on 4/7  3. Continue Depakote ER 1500mg  nightly for mood stabilization and sleep (started on 4/6)  1. Valproic acid level ordered for 4/9  4. Increase Temazepam to 30mg  nightly   5. Discontinue Ativan as we have been tapering due to lack of benefit   6. Discontinue prazosin as nightmares have not improved   7. Continue other medications as currently prescribed      REASONS FOR CONTINUED HOSPITALIZATION  Psychiatric stabilization and medication management.  Continues with significant psychosis and has been sleeping from 0 to 3 hrs nightly. Patient is not stable at this time for discharge back to home and if were discharged would likely require hospitalization again in the near future.     ______________________________________________________________  SUBJECTIVE  Kristin Maynard is seen in the hallway today.  She asked if the treatment team could call her father today and let him know that she would be released and what time to pick her up.  I informed her that she would not be leaving today or for several days as her sleep continues to be a concern and she is under a a lot of stress with the ongoing hallucinations.  She verbalized understanding.  She rates her mood as a 7.5 out of 10 with 10 being good.  Her appetite has been good.  Sleep has continued to be poor but she states that she is not tired.  Nightmares have been keeping her up and she believes that praying helps.  She reports that the voices are better today.  She is currently seeing visual hallucinations of the devil.  She denies SI/HI    Kristin Maynard has been more calm has continued to be responding to stimuli.  She has not had any recent outbursts.  She slept for documented 1.5 hours last night.    REVIEW OF SYSTEMS  Review of Systems   Gastrointestinal: Negative for constipation, diarrhea and vomiting.   Musculoskeletal: Positive for back pain.   Skin: Negative for rash.   Neurological: Negative for dizziness and headaches.   Psychiatric/Behavioral: Positive for hallucinations. Negative for suicidal ideas. The patient is nervous/anxious and has insomnia.      ______________________________________________________________  OBJECTIVE                 Vital Signs:  Current                Vital Signs: 24 Hour Range   BP: 135/96 (04/07 0800)  Temp: 36.2 ?C (97.2 ?F) (04/07 0800)  Pulse: 105 (04/07 0800)  Respirations: 18 PER MINUTE (04/07 0800)  SpO2: 96 % (04/07 0800) BP: (135-142)/(94-96)   Temp:  [36.2 ?C (97.2 ?F)-36.6 ?C (97.8 ?F)]   Pulse:  [105-135]   Respirations:  [17 PER MINUTE-18 PER MINUTE]   SpO2:  [96 %]  Intensity Pain Scale (Self Report): (not recorded)      Scheduled Medications:  [START ON 10/21/2021] ARIPiprazole (ABILIFY) tablet 30 mg, 30 mg, Oral, QDAY  cabergoline (DOSTINEX) (+) tablet 0.5 mg **PATIENT OWN MED**, 0.5 mg, Oral, Once per day on Mon Thu  divalproex (DEPAKOTE ER) ER tablet 1,500 mg, 1,500 mg, Oral, QHS  levonorgestrel-ethinyl estradiol (AVIANE-28, LESSINA-28) tablet 1 tablet, 1 tablet, Oral, QDAY  melatonin tablet 5 mg, 5 mg, Oral, QHS  metoprolol tartrate tablet 25 mg, 25 mg, Oral, QDAY  QUEtiapine (SEROquel) tablet 800 mg, 800 mg, Oral, QHS  temazepam (RESTORIL) capsule 30 mg, 30 mg, Oral, QHS        PRN Medications:  acetaminophen Q6H PRN 650 mg at 10/16/21 0917, calcium carbonate TID PRN, hydrOXYzine Q6H PRN 25 mg at 10/18/21 0833, OLANZapine Q6H PRN 5 mg at 10/18/21 1539 **OR** OLANZapine Q6H PRN, ondansetron Q6H PRN 4 mg at 10/15/21 1343, polyethylene glycol 3350 QDAY PRN, senna/docusate QDAY PRN    MENTAL STATUS EXAMINATION  General/Constitutional: appears stated age, dressed appropriately, good hygiene   Eye Contact: good  Behavior: Calm, cooperative; appropriate for conversation  Speech: RRR with normal volume and tone. Good articulation  Mood: good  Affect: brightened; mood congruent  Thought Process: Linear and goal directed  Thought Content: denies SI, denies HI. Continued delusions  Perception: Endorses AH that have improved and VH of the devil   Associations: Intact  Insight/Judgment: poor-improving/fluctuating     Physical Exam:  Gait: Ambulates without difficulty. Normal arm swing     ______________________________________________________________  [name], DO

## 2021-10-20 NOTE — Group Note
Name: Kristin Maynard   MRN: [id number]     DOB: 1996-01-15      Age: 26 y.o.  Admission Date: 09/23/2021     LOS: 27 days     Date of Service: 10/20/2021      Group Topic: BH Goal Setting  Group Date: 10/20/2021  Start Time: 1245  End Time: 1345  Facilitators: Garner Nash          Number of Participants: 10  Group Focus: communication, coping skills, feeling awareness/expression, goals/reality orientation, leisure skills, music therapy, problem solving, relaxation, reminiscence, and social skills  Treatment Modality: Music Therapy  Interventions utilized were other Strength Shields  Purpose: enhance coping skills, express feelings, improve communication skills, increase insight, and reinforce self-care    Fight Songs: The therapist provided patients with the lyrics to Fight Song. The therapist facilitated a discussion about empowerment and internal motivations. Patients then reflected on their own ways of motivation and coping skills. Patients then shared a song that they felt was empowering.  Shield Strength Recognition: The music therapist gave patients a shield worksheet and markers. Within the shield, patients were instructed to list their personal goals, strengths, support system, and coping skills as well as motivating songs. While patients completed their shields, the therapist played music focused on strength and empowerment. Patients then shared their shields with the group. The purpose of this intervention is to enhance self-confidence, self-worth, coping skills, goal setting, and communication/expression of emotions.      Name: Kristin Maynard Date of Birth: [birth year]   MR: [id number]      Level of Participation: minimal   Quality of Participation: distractible and restless  Interactions with others: did not interact  Mood/Affect: anxious and flat  Cognition: confused and responding to internal stimuli  Progress: Minimal  Response: The patient minimally engaged within the group. Throughout the group, the patient was observed responding to internal stimuli and occasionally became restless; patient using hands to mimic motion of a mouth while speaking to internal stimuli. Similar to using two sock puppets to interact with one another. Patient occasionally refocused on group to sing along to familiar songs.   Plan: to continue treatment

## 2021-10-21 NOTE — Group Note
Name: Lilliauna Van   MRN: [id number]     DOB: 04/22/1996      Age: 26 y.o.  Admission Date: 09/23/2021     LOS: 28 days     Date of Service: 10/21/2021      Group Topic: BH Coping Skills  Group Date: 10/21/2021  Start Time: 1025  End Time: 1115  Facilitators: Michelene Heady          Number of Participants: 11  Group Focus: coping skills, relaxation, and self-awareness  Treatment Modality: Group Psychotherapy and Spiritual  Interventions utilized were exploration and group exercise  Purpose: enhance coping skills, express feelings, and increase insight      Group members reviewed and reflected on handout on "The Connection Between Spirituality and Mental Health".  Group members first learnt the definition of each term, expressed what spirituality meant to them and reviewed how spirituality is used as a Technical sales engineer to help improve mental health.       Name: Hilary Date of Birth: February 23, 1996   MR: [id number]      Level of Participation: patient not present for group   Plan: to continue treatment

## 2021-10-21 NOTE — Care Plan
Problem: Mood - Altered  Goal: Stabilize mood  Outcome: Goal Ongoing     Problem: Self-esteem - Low  Goal: Demonstration of positive self-esteem  Outcome: Goal Ongoing     Problem: Thought Process - Altered  Goal: Demonstration of organized thought processes  Outcome: Goal Ongoing

## 2021-10-21 NOTE — Progress Notes
PRN Medication Administered:  Zyprexa    Non pharmacological interventions attempted prior to PRN medication administration:  Decrease stimuli    Brief narrative of reason for PRN medication administration: Patient observed pacing the hallways talking with unseen others. She was progressively getting louder.

## 2021-10-21 NOTE — Group Note
Name: Annalicia Renfrew   MRN: [id number]     DOB: Mar 16, 1996      Age: 26 y.o.  Admission Date: 09/23/2021     LOS: 28 days     Date of Service: 10/21/2021      Group Topic: BH Coping Skills  Group Date: 10/21/2021  Start Time: 0900  End Time: 1000  Facilitators: Harrell Lark          Number of Participants: 9  Group Focus: communication, concentration, coping skills, leisure skills, music therapy, relaxation, and social skills  Treatment Modality: Music Therapy  Interventions utilized were leisure development  Purpose: enhance coping skills, express feelings, and reinforce self-care    Patients were provided music themed word searching and coloring pages. While completing these, the patients requested preferred songs to listen to. These interventions promote mindfulness, leisure skills, communication, and socialization.        Name: Atiyah Date of Birth: 1996-04-28   MR: [id number]      Level of Participation: patient not present for group   Plan: to continue treatment

## 2021-10-21 NOTE — Group Note
Name: Babette Stum   MRN: [id number]     DOB: 23-Jun-1996      Age: 26 y.o.  Admission Date: 09/23/2021     LOS: 28 days     Date of Service: 10/21/2021      Group Topic: BH Coping Skills  Group Date: 10/21/2021  Start Time: 1330  End Time: 1410  Facilitators: Michelene Heady          Number of Participants: 6  Group Focus: coping skills, relaxation, and self-awareness  Treatment Modality: Group Psychotherapy and Spiritual  Interventions utilized were exploration and group exercise  Purpose: enhance coping skills, express feelings, and increase insight      2nd Part: Group members reviewed and reflected on handout on "The Connection Between Spirituality and [place]".  Group members expressed what spirituality meant to them and reviewed the various ways that an individual may explore spirituality and how to incorporate them in our individual lives.  Group members reflected on quotes on spirituality and mental health wellbeing      Name: Joyleen Date of Birth: 05/29/1996   MR: [id number]      Level of Participation: patient not present for group   Plan: to continue treatment

## 2021-10-21 NOTE — Care Plan
Problem: Mood - Altered  Goal: Stabilize mood  Outcome: Goal Ongoing  Flowsheets (Taken 10/20/2021 1938)  Stabilize mood:   Establish therapeutic relationships   Assess coping style   Assess ineffective coping signs and symptoms     Problem: Thought Process - Altered  Goal: Demonstration of organized thought processes  Outcome: Goal Ongoing  Flowsheets (Taken 10/20/2021 1938)  Demonstration of organized thought processes:   Assess cognitive ability   Assess thought process   Provide environmental regulation - agitation   Assess disturbed thought process signs and symptoms     Problem: Health Maintenance - Impaired  Goal: Able to perfom ADL's  Outcome: Goal Ongoing  Flowsheets (Taken 10/20/2021 1938)  Able to perform ADLs:   Assess for pain   Assess ADL ability   Assess fluid volume   Assess nutritional intake   Assess sleep/rest

## 2021-10-21 NOTE — Progress Notes
Psychiatry Progress Note  Name: Kristin Maynard          MRN: [id number] DOB: 1996/03/11         Age: 26 y.o.  Admission Date: 09/23/2021  LOS: 28 days    Service: [facility] Adult [facility]    Principal Problem:    Schizophrenia (HCC)  Active Problems:    Pituitary macroadenoma (HCC)    Hyperprolactinemia (HCC)    Elevated BP without diagnosis of hypertension    Sinus tachycardia    Leukocytosis  R/o bipolar mood disorder, current episode manic with psychotic features       PLAN  1. Continued admission for stabilization  2. Continue Abilify to 30mg  starting on 4/7  3. Continue Depakote ER 1500mg  nightly for mood stabilization and sleep (started on 4/6)  1. Valproic acid level ordered for 4/9  4. Continue temazepam to 30mg  nightly   5. Keep discontinued Ativan as we have been tapering due to lack of benefit   6. Keep discontinued prazosin as nightmares have not improved   7. Continue other medications as currently prescribed      REASONS FOR CONTINUED HOSPITALIZATION  Psychiatric stabilization and medication management.  Continues with significant psychosis and has been sleeping from 0 to 3 hrs nightly. Patient is not stable at this time for discharge back to home and if were discharged would likely require hospitalization again in the near future.     ______________________________________________________________  SUBJECTIVE  Kristin Maynard is seen in the hallway today.  She was pacing the hallway when seen.  She was talking to herself.  When asked who she was talking to her she said my voices.  When asked how many voices she heard she says depend on when and where I am she did not did not talk much about her voices.  She says she has been eating okay.  She says she has been sleeping okay.  She reports no anxiety as of now.  She feels safe on the unit and has no suicidal ideations homicidal ideations or plan.  She does not want any of her medications changed as of today.  She has had no outburst according to nurses.    REVIEW OF SYSTEMS  Review of Systems   Gastrointestinal: Negative for constipation, diarrhea and vomiting.   Musculoskeletal: Negative for back pain.   Skin: Negative for rash.   Neurological: Negative for dizziness and headaches.   Psychiatric/Behavioral: Positive for hallucinations. Negative for suicidal ideas. The patient is nervous/anxious. The patient does not have insomnia.      ______________________________________________________________  OBJECTIVE                 Vital Signs:  Current                Vital Signs: 24 Hour Range   BP: 144/111 (04/08 0853)  Temp: 36.2 ?C (97.2 ?F) (04/08 0800)  Pulse: 117 (04/08 0800)  Respirations: 20 PER MINUTE (04/08 0800)  SpO2: 98 % (04/08 0800)  O2 Device: None (Room air) (04/07 1945) BP: (144-146)/(10-111)   Temp:  [36.1 ?C (97 ?F)-36.2 ?C (97.2 ?F)]   Pulse:  [117]   Respirations:  [20 PER MINUTE]   SpO2:  [97 %-98 %]   O2 Device: None (Room air)   Intensity Pain Scale (Self Report): (not recorded)      Scheduled Medications:  ARIPiprazole (ABILIFY) tablet 30 mg, 30 mg, Oral, QDAY  cabergoline (DOSTINEX) (+) tablet 0.5 mg **PATIENT OWN MED**, 0.5  mg, Oral, Once per day on Mon Thu  divalproex (DEPAKOTE ER) ER tablet 1,500 mg, 1,500 mg, Oral, QHS  levonorgestrel-ethinyl estradiol (AVIANE-28, LESSINA-28) tablet 1 tablet, 1 tablet, Oral, QDAY  melatonin tablet 5 mg, 5 mg, Oral, QHS  metoprolol tartrate tablet 25 mg, 25 mg, Oral, QDAY  QUEtiapine (SEROquel) tablet 800 mg, 800 mg, Oral, QHS  temazepam (RESTORIL) capsule 30 mg, 30 mg, Oral, QHS        PRN Medications:  acetaminophen Q6H PRN 650 mg at 10/16/21 0917, calcium carbonate TID PRN, hydrOXYzine Q6H PRN 25 mg at 10/18/21 0833, OLANZapine Q6H PRN 5 mg at 10/18/21 1539 **OR** OLANZapine Q6H PRN, ondansetron Q6H PRN 4 mg at 10/15/21 1343, polyethylene glycol 3350 QDAY PRN, senna/docusate QDAY PRN    MENTAL STATUS EXAMINATION  General/Constitutional: appears stated age, dressed appropriately, good hygiene   Eye Contact: good  Behavior: Calm, cooperative; appropriate for conversation  Speech: RRR with normal volume and tone. Good articulation  Mood: good  Affect: brightened; mood congruent  Thought Process: Linear and goal directed  Thought Content: denies SI, denies HI. Continued delusions  Perception: Endorses AH that have improved and VH of the devil   Associations: Intact  Insight/Judgment: poor-improving/fluctuating     Physical Exam:  Gait: Ambulates without difficulty. Normal arm swing     ______________________________________________________________  Hardie Pulley, MD

## 2021-10-21 NOTE — Group Note
Name: Kristin Maynard   MRN: [id number]     DOB: December 18, 1995      Age: 26 y.o.  Admission Date: 09/23/2021     LOS: 28 days     Date of Service: 10/21/2021      Group Topic: [place] Leisure Activities  Group Date: 10/21/2021  Start Time: 1115  End Time: 1200  Facilitators: Harrell Lark          Number of Participants: 11  Group Focus: communication, concentration, coping skills, leisure skills, relaxation, and social skills  Treatment Modality: Music Therapy  Interventions utilized were other Music BINGO  Purpose: enhance coping skills, express feelings, improve communication skills, and reinforce self-care    The patients were provided BINGO cards that used songs instead of numbers. When the therapist played the song, patients covered that song title. This intervention promotes leisure skills, self-care, communication, and social skills.       Name: Kristin Maynard Date of Birth: 1995-10-03   MR: [id number]      Level of Participation: patient not present for group   Plan: to continue treatment

## 2021-10-21 NOTE — Care Plan
Problem: Thought Process - Altered  Goal: Demonstration of organized thought processes  Outcome: Goal Ongoing  Flowsheets (Taken 10/20/2021 2340)  Demonstration of organized thought processes:   Assess disturbed thought process signs and symptoms   Provide environmental regulation - agitation   Assess thought process     Problem: Health Maintenance - Impaired  Goal: Establish therapeutic relationships  Outcome: Goal Ongoing  Flowsheets (Taken 10/20/2021 2340)  Establish therapeutic relationships:   Encourage expressions of feelings   Involve patient in decision-making process     Problem: Cognitive-Perceptual Pattern - Impaired  Goal: Demonstration of organized thought processes  Outcome: Goal Ongoing  Flowsheets (Taken 10/20/2021 2340)  Demonstration of organized thought process:   Assess abnormal thought content signs and symptoms   Regulation of the environment   Assess disturbed thought process signs and symptoms   Assess hallucination signs and symptoms

## 2021-10-22 NOTE — Group Note
Name: Kristin Maynard   MRN: [id number]     DOB: Nov 26, 1995      Age: 26 y.o.  Admission Date: 09/23/2021     LOS: 29 days     Date of Service: 10/22/2021      Group Topic: BH Effective Decision Making  Group Date: 10/22/2021  Start Time: 1100  End Time: 1200  Facilitators: Melany Guernsey          Number of Participants: 18  Group Focus: check in, communication, feeling awareness/expression, and problem solving  Treatment Modality: Interpersonal Therapy and Psychoeducation  Interventions utilized were clarification, confrontation, and support  Purpose: enhance coping skills, explore maladaptive thinking, express feelings, and reinforce self-care    The group had an open discussion focused on Self-compassion by answering two questions   1. What do you need right now?  2. What do you long to hear from others?           Name: Kristin Maynard Date of Birth: [birth year]   MR: [id number]      Level of Participation: minimal   Quality of Participation: attentive and distractible  Interactions with others: did not interact  Mood/Affect: flat  Triggers (if applicable):   Cognition: coherent/clear  Progress: Minimal  Response:   Plan: to continue treatment

## 2021-10-22 NOTE — Group Note
Name: Kristin Maynard   MRN: [id number]     DOB: [birth year]      Age: 26 y.o.  Admission Date: 09/23/2021     LOS: 29 days     Date of Service: 10/22/2021      Group Topic: BH Effective Decision Making  Group Date: 10/22/2021  Start Time: 1430  End Time: 1530  Facilitators: Melany Guernsey          Number of Participants: 11  Group Focus: forgiveness  Treatment Modality: Interpersonal Therapy  Interventions utilized were problem solving  Purpose: express feelings and reinforce self-care    This group focused on self-forgiveness. Patients read the 10 ways to self-forgiveness. Patient wrote a self-forgiveness letter to themselves.           Name: Kristin Maynard Date of Birth: 22-Nov-1995   MR: [id number]      Level of Participation: active   Quality of Participation: attentive and cooperative  Interactions with others: gave feedback  Mood/Affect: appropriate  Triggers (if applicable):   Cognition: coherent/clear  Progress: Significant  Response:   Plan: to continue treatment

## 2021-10-22 NOTE — Care Plan
Problem: Mood - Altered  Goal: Stabilize mood  Outcome: Goal Ongoing  Flowsheets (Taken 10/21/2021 2318)  Stabilize mood:   Assess depressive symptoms   Establish therapeutic relationships   Assess coping style     Problem: Thought Process - Altered  Goal: Demonstration of organized thought processes  Outcome: Goal Ongoing  Flowsheets (Taken 10/21/2021 2318)  Demonstration of organized thought processes:   Assess cognitive ability   Provide environmental regulation - agitation   Assess disturbed thought process signs and symptoms   Assess in reality orientation   Assess risk for impaired cognition   Assess thought process     Problem: Cognitive-Perceptual Pattern - Impaired  Goal: Demonstration of organized thought processes  Outcome: Goal Ongoing  Flowsheets (Taken 10/21/2021 2318)  Demonstration of organized thought process:   Assess abnormal thought content signs and symptoms   Assess patient history   Regulation of the environment   Hallucination management   Assess disturbed thought process signs and symptoms   Assess hallucination signs and symptoms

## 2021-10-22 NOTE — Progress Notes
Psychiatry Progress Note  Name: Kristin Maynard          MRN: [id number] DOB: [birth year]         Age: 26 y.o.  Admission Date: 09/23/2021  LOS: 29 days    Service: [facility] Adult Psych A    Principal Problem:    Schizophrenia (HCC)  Active Problems:    Pituitary macroadenoma (HCC)    Hyperprolactinemia (HCC)    Elevated BP without diagnosis of hypertension    Sinus tachycardia    Leukocytosis  R/o bipolar mood disorder, current episode manic with psychotic features       PLAN  1. Continued admission for stabilization  2. No changes in medications as of 10/22/2021  3. Continue Abilify to 30mg  starting on 4/7  4. Continue Depakote ER 1500mg  nightly for mood stabilization and sleep (started on 4/6)  1. Valproic acid level ordered for 4/9  5. Continue temazepam to 30mg  nightly   6. Keep discontinued Ativan as we have been tapering due to lack of benefit   7. Keep discontinued prazosin as nightmares have not improved   8. Continue other medications as currently prescribed      REASONS FOR CONTINUED HOSPITALIZATION  Psychiatric stabilization and medication management.  Continues with significant psychosis and has been sleeping from 0 to 3 hrs nightly. Patient is not stable at this time for discharge back to home and if were discharged would likely require hospitalization again in the near future.     ______________________________________________________________  SUBJECTIVE  Kristin Maynard is seen in her room today she reports that she does not feel as well and thinks that she could be doing better she reports she could not sleep.  She reports she has been having nightmares.  She reports she is eating okay she lives with her dad at home and return back to him  on discharge.    REVIEW OF SYSTEMS  Review of Systems   Gastrointestinal: Negative for constipation, diarrhea and vomiting.   Musculoskeletal: Negative for back pain.   Skin: Negative for rash.   Neurological: Negative for dizziness and headaches.   Psychiatric/Behavioral: Positive for hallucinations. Negative for suicidal ideas. The patient is nervous/anxious. The patient does not have insomnia.      ______________________________________________________________  OBJECTIVE                 Vital Signs:  Current                Vital Signs: 24 Hour Range   BP: 146/108 (04/09 0700)  Temp: 36.2 ?C (97.2 ?F) (04/09 0700)  Pulse: 119 (04/09 0700)  Respirations: 16 PER MINUTE (04/09 0700)  SpO2: 95 % (04/09 0700)  O2 Device: None (Room air) (04/08 1956) BP: (146)/(93-108)   Temp:  [36.2 ?C (97.2 ?F)]   Pulse:  [107-119]   Respirations:  [16 PER MINUTE-20 PER MINUTE]   SpO2:  [95 %-97 %]   O2 Device: None (Room air)   Intensity Pain Scale (Self Report): (not recorded)      Scheduled Medications:  ARIPiprazole (ABILIFY) tablet 30 mg, 30 mg, Oral, QDAY  cabergoline (DOSTINEX) (+) tablet 0.5 mg **PATIENT OWN MED**, 0.5 mg, Oral, Once per day on Mon Thu  divalproex (DEPAKOTE ER) ER tablet 1,500 mg, 1,500 mg, Oral, QHS  levonorgestrel-ethinyl estradiol (AVIANE-28, LESSINA-28) tablet 1 tablet, 1 tablet, Oral, QDAY  melatonin tablet 5 mg, 5 mg, Oral, QHS  metoprolol tartrate tablet 25 mg, 25 mg, Oral, QDAY  QUEtiapine (SEROquel) tablet  800 mg, 800 mg, Oral, QHS  temazepam (RESTORIL) capsule 30 mg, 30 mg, Oral, QHS        PRN Medications:  acetaminophen Q6H PRN 650 mg at 10/22/21 1154, calcium carbonate TID PRN, hydrOXYzine Q6H PRN 25 mg at 10/22/21 0045, OLANZapine Q6H PRN 5 mg at 10/21/21 2114 **OR** OLANZapine Q6H PRN, ondansetron Q6H PRN 4 mg at 10/15/21 1343, polyethylene glycol 3350 QDAY PRN, senna/docusate QDAY PRN    MENTAL STATUS EXAMINATION  General/Constitutional: appears stated age, dressed appropriately, good hygiene   Eye Contact: good  Behavior: Calm, cooperative; appropriate for conversation  Speech: RRR with normal volume and tone. Good articulation  Mood: I am not doing well I am still hearing a lot of voices I am having nightmares  Affect: brightened; mood congruent  Thought Process: Linear and goal directed  Thought Content: denies SI, denies HI. Continued delusions  Perception: Endorses AH that have improved and VH of the devil   Associations: Intact  Insight/Judgment: poor-improving/fluctuating     Physical Exam:  Gait: Ambulates without difficulty. Normal arm swing     ______________________________________________________________  Hardie Pulley, MD

## 2021-10-22 NOTE — Group Note
Name: Kristin Maynard   MRN: [id number]     DOB: January 17, 1996      Age: 26 y.o.  Admission Date: 09/23/2021     LOS: 29 days     Date of Service: 10/22/2021      Group Topic: [place] Leisure Activities  Group Date: 10/22/2021  Start Time: 0930  End Time: 4166  Facilitators: Earl Lites          Number of Participants: 14  Group Focus: leisure skills and social skills  Treatment Modality: Recreation Therapy  Interventions utilized were group exercise and leisure development  Purpose: improve communication skills and reinforce self-care    Patients participated in exercise and a variety of Easter games. As a group patients filled out a Easter crossword puzzle, Ivor Costa rabbit guess who game and word search. Patients played a Easter guess who type game.       Name: Kristin Maynard Date of Birth: November 11, 1995   MR: [id number]      Level of Participation: moderate   Quality of Participation: cooperative  Interactions with others: minimal, patient talked to herself a lot during the first half of group.  Mood/Affect: blunted and flat  Cognition: not focused  Progress: Minimal to moderate  Plan: to continue treatment

## 2021-10-22 NOTE — Progress Notes
PRN Medication Administered:  PO Atarax '25mg'$   Non pharmacological interventions attempted prior to PRN medication administration:  Decrease stimuli, verbal redirection and de-escalation techniques  Brief narrative of reason for PRN medication administration:   Pt was heard screaming loudly in her room, unable to be redirected. On-call Dr. Astrid Divine was notified, no orders were provided. Pt was accepting of prn, checked for cheeking. Pt has been restless all shift, has not slept.

## 2021-10-22 NOTE — Progress Notes
Pt refused labs.

## 2021-10-22 NOTE — Group Note
Name: Kristin Maynard   MRN: [id number]     DOB: 1996-05-21      Age: 26 y.o.  Admission Date: 09/23/2021     LOS: 29 days     Date of Service: 10/22/2021      Group Topic: [place] Leisure Activities  Group Date: 10/22/2021  Start Time: 1300  End Time: 1400  Facilitators: Earl Lites          Number of Participants: 13  Group Focus: leisure skills and social skills  Treatment Modality: Leisure Development  Interventions utilized were group exercise and leisure development  Purpose: enhance coping skills, improve communication skills, and reinforce self-care    Patients learned one to two new card games they can play in their leisure time. Patients formed three groups.       Name: Kristin Maynard Date of Birth: Mar 01, 1996   MR: [id number]      Level of Participation: patient not present for group   Plan: to continue treatment

## 2021-10-22 NOTE — Care Plan
Problem: Skin Integrity  Goal: Skin integrity intact  Outcome: Goal Ongoing     Problem: High Fall Risk  Goal: High Fall Risk  Outcome: Goal Ongoing     Problem: Violence, self/other-directed, Risk of  Goal: Absence of violence  Outcome: Goal Ongoing

## 2021-10-22 NOTE — Progress Notes
Pt refuses to wear wristband despite encouragement.

## 2021-10-23 MED ORDER — LORAZEPAM 2 MG/ML IJ SOLN
2 mg | Freq: Once | INTRAMUSCULAR | 0 refills | Status: CP
Start: 2021-10-23 — End: ?
  Administered 2021-10-23: 15:00:00 2 mg via INTRAMUSCULAR

## 2021-10-23 MED ORDER — DIAZEPAM 5 MG PO TAB
5 mg | Freq: Every evening | ORAL | 0 refills | Status: DC | PRN
Start: 2021-10-23 — End: 2021-10-27
  Administered 2021-10-26: 01:00:00 5 mg via ORAL

## 2021-10-23 MED ORDER — HALOPERIDOL LACTATE 5 MG/ML IJ SOLN
5 mg | INTRAMUSCULAR | 0 refills | Status: DC | PRN
Start: 2021-10-23 — End: 2021-10-27

## 2021-10-23 MED ORDER — LORAZEPAM 1 MG PO TAB
1 mg | Freq: Two times a day (BID) | ORAL | 0 refills | Status: DC
Start: 2021-10-23 — End: 2021-10-23

## 2021-10-23 MED ORDER — ARIPIPRAZOLE 15 MG PO TAB
15 mg | Freq: Every day | ORAL | 0 refills | Status: DC
Start: 2021-10-23 — End: 2021-10-27
  Administered 2021-10-24 – 2021-10-27 (×4): 15 mg via ORAL

## 2021-10-23 MED ORDER — LORAZEPAM 2 MG PO TAB
2 mg | ORAL | 0 refills | Status: DC | PRN
Start: 2021-10-23 — End: 2021-11-02
  Administered 2021-10-24 – 2021-10-28 (×2): 2 mg via ORAL

## 2021-10-23 MED ORDER — HALOPERIDOL LACTATE 5 MG/ML IJ SOLN
5 mg | Freq: Once | INTRAMUSCULAR | 0 refills | Status: CP
Start: 2021-10-23 — End: ?
  Administered 2021-10-23: 15:00:00 5 mg via INTRAMUSCULAR

## 2021-10-23 MED ORDER — LORAZEPAM 2 MG/ML IJ SOLN
2 mg | INTRAMUSCULAR | 0 refills | Status: DC | PRN
Start: 2021-10-23 — End: 2021-11-02

## 2021-10-23 MED ORDER — METOPROLOL TARTRATE 25 MG PO TAB
25 mg | Freq: Two times a day (BID) | ORAL | 0 refills | Status: DC
Start: 2021-10-23 — End: 2021-11-02
  Administered 2021-10-24 – 2021-11-02 (×20): 25 mg via ORAL

## 2021-10-23 MED ORDER — HALOPERIDOL 5 MG PO TAB
5 mg | ORAL | 0 refills | Status: DC | PRN
Start: 2021-10-23 — End: 2021-10-27
  Administered 2021-10-27: 02:00:00 5 mg via ORAL

## 2021-10-23 MED ORDER — DIAZEPAM 5 MG PO TAB
5 mg | Freq: Two times a day (BID) | ORAL | 0 refills | Status: DC
Start: 2021-10-23 — End: 2021-10-27
  Administered 2021-10-23 – 2021-10-27 (×8): 5 mg via ORAL

## 2021-10-23 NOTE — Group Note
Name: Kristin Maynard   MRN: [id number]     DOB: 1995-07-23      Age: 26 y.o.  Admission Date: 09/23/2021     LOS: 30 days     Date of Service: 10/23/2021      Group Topic: BH Relaxation and Self-Soothing  Group Date: 10/23/2021  Start Time: 0915  End Time: 1000  Facilitators: Earl Lites          Number of Participants: 11  Group Focus: coping skills, leisure skills, relaxation, and self-awareness  Treatment Modality: Recreation Therapy  Interventions utilized were group exercise, leisure development, and mental fitness  Purpose: enhance coping skills and reinforce self-care    Patients participated in chair and cardio exercise. Patients participated in breathing meditation and then stretching exercises.       Name: Kristin Maynard Date of Birth: [birth year]   MR: [id number]      Level of Participation: patient declined to participate in group   Response: Patient came to group late and asked to use the bathroom. Patient started talking/yelling in the bathroom, saying that she could not use that bathroom. Patient left and went back to use her bathroom. Patient did not return.   Plan: to continue treatment

## 2021-10-23 NOTE — Care Plan
Problem: Mood - Altered  Goal: Stabilize mood  Outcome: Goal Ongoing  Flowsheets (Taken 10/23/2021 1336)  Stabilize mood: Assess depressive symptoms     Problem: Violence, self/other-directed, Risk of  Goal: Absence of violence  Outcome: Goal Ongoing  Flowsheets (Taken 10/23/2021 1336)  Absence of violence: Environmental safety management     Problem: Health Maintenance - Impaired  Goal: Able to perfom ADL's  Outcome: Goal Ongoing  Flowsheets (Taken 10/23/2021 1336)  Able to perform ADLs: Assess for pain

## 2021-10-23 NOTE — Progress Notes
PRN Medication Administered:  Zyprexa '5mg'$  ODT  Non pharmacological interventions attempted prior to PRN medication administration:  Verbal de-escalation techniques, Decrease stimuli and Increase structure and limit setting    Brief narrative of reason for PRN medication administration:   Agitation - patient yelling at unseen others, tearing up papers in room, staff unable to redirect

## 2021-10-23 NOTE — Progress Notes
PRN Medication Administered:    Atarax '25mg'$   Non pharmacological interventions attempted prior to PRN medication administration    Educated on coping skills, dimmed lighting     Brief narrative of reason for PRN medication administration:     -Pt with increased pacing and agitation in room. Pt becoming louder with verbal self-talk.

## 2021-10-23 NOTE — Progress Notes
Chaplain Note: During Chaplain rounding, Chaplain visited with patient. When Chaplain arrived, patient was in her room yelling and talking with unseen people with medical staff present at the door.     When patient did speak with Chaplain, patient said, "We are going to die and I can't stop it. No one can stop it".     Patient then started tearing up paper and stated that is the only way she can save everyone.     Chaplain remained present while staff made attempts to redirect patient and prayed for patient and staff.    When patient calmed down, Chaplain shared with patient, if she would like to talk with Chaplain to have the Nurse page Chaplain.     The spiritual care team is available as needed, 24/7, through the campus switchboard [phone]). For a response within 24 hours, please submit an order in [facility] for a chaplain consult.    Admit Date: 09/23/2021                                      10/23/2021 11:21 AM Kristin Maynard      PCU 10 PCU

## 2021-10-23 NOTE — Progress Notes
Pharmacy reached out about ongoing elevated BP.     BP today 140/98 around noon.  HR 108.  Since 4/6, consistently elevated and above goal.      Increase metoprolol to 25 mg BID from daily.    Aggie Hacker, MD

## 2021-10-23 NOTE — Care Plan
Problem: Mood - Altered  Goal: Stabilize mood  Flowsheets (Taken 10/21/2021 2318 by Darlin Drop, RN)  Stabilize mood:   Assess depressive symptoms   Establish therapeutic relationships   Assess coping style     Problem: Health Maintenance - Impaired  Goal: Able to perfom ADL's  Flowsheets (Taken 10/20/2021 1938 by Evelina Bucy, RN)  Able to perform ADLs:   Assess for pain   Assess ADL ability   Assess fluid volume   Assess nutritional intake   Assess sleep/rest     Problem: Coping - Ineffective, Family  Goal: Effective Coping  Flowsheets (Taken 10/17/2021 2343)  Effective coping:   Assess support system   Provide coping support

## 2021-10-23 NOTE — Progress Notes
Psychotherapy Progress Note    NAME:Emili Shaye Elling MRN: [id number] DOB:29-Jul-1995 AGE: 26 y.o.  ADMISSION DATE: 09/23/2021 DAYS ADMITTED: LOS: 30 days    Date of Service:  10/23/21   2:47pm    Principal Problem:    Schizophrenia ([place])  Active Problems:    Pituitary macroadenoma ([place])    Hyperprolactinemia (HCC)    Elevated BP without diagnosis of hypertension    Sinus tachycardia    Leukocytosis    Objective:  Meaghan will meet with therapist to discuss progress toward reality orientation, appropriate affect, organized thought, and improved relational skills.    Narrative:  SW attempted to meet with patient to check-in and discuss any therapy needs. Patient was asleep in the day room. SW to try again at a later date, as patient has not been sleeping well at night.     Follow up:  Will continue to follow-up for individual therapy.

## 2021-10-23 NOTE — Behavioral Health Treatment Team
TREATMENT TEAM NOTE  Name: Kristin Maynard          MRN: [id number]              DOB: [birth year]          Age: 26 y.o.  Admission Date: 09/23/2021                 Attendees:   Psychiatrist: Arnette Norris, DO  Nurse Supervisor: Cyndia Skeeters, RN, BSN  Nurse: Dede Query, RN  Pharmacist: Norvel Richards, PharmD  Therapist: Dewayne Hatch, [place], [place]  Therapist: Neita Carp, [place]  Case Manager: Carmela Rima, LMSW  Case Manager: Tinnie Gens, LMSW  Utilization Review: Areatha Keas, RN    Significant Points Discussed: Responding to internal stimuli. Hallucinations of cats. Slept 5 hours. Over-stimulated in lunchroom.      Treatment Plan Discussion: Continue treatment.      Projected Discharge Date: Possible Wed/Thurs

## 2021-10-23 NOTE — Group Note
Name: Kristin Maynard   MRN: [id number]     DOB: 14-Aug-1995      Age: 26 y.o.  Admission Date: 09/23/2021     LOS: 30 days     Date of Service: 10/23/2021      Group Topic: BH Emotion Regulation (DBT Skill)  Group Date: 10/23/2021  Start Time: 1245  End Time: 1400  Facilitators: Candie Chroman          Number of Participants: 13  Group Focus: feeling awareness/expression  Treatment Modality: Art Therapy  Interventions utilized were assignment and exploration  Purpose: enhance coping skills and express feelings    Therapist played calming music and encouraged group members to experiment with watercolor techniques for relaxation. Participants who made a sculpture in our previous session were allowed to paint their clay.          Name: Kristin Maynard Date of Birth: February 02, 1996   MR: [id number]      Level of Participation: patient not present for group   Plan: to continue treatment

## 2021-10-24 NOTE — Behavioral Health Treatment Team
TREATMENT TEAM NOTE  Name: Kristin Maynard          MRN: [id number]              DOB: 1996/02/02          Age: 26 y.o.  Admission Date: 09/23/2021                 Attendees:   Psychiatrist: Arnette Norris, DO  Psychiatrist: Alfredia Ferguson, APRN  Nurse: Lucius Conn, RN  Pharmacist: Norvel Richards, PharmD  Therapist: Dewayne Hatch, [place], [place]  Therapist: Neita Carp, LMSW  Case Manager: Carmela Rima, LMSW  Case Manager: Tinnie Gens, LMSW  Case Manager: April Cordero, [facility]      Significant Points Discussed: anxiety and depression=1000; one episode of agitation, required PD assistance for IM; calmer in PM; slept 5.75 hours       Treatment Plan Discussion: consulted neurosurgery-- will stop taking medications for tumor in order to make more med changes for psychotropics; CM to discuss future guardianship with family       Projected Discharge Date: late this week/early next week

## 2021-10-24 NOTE — Progress Notes
PRN Medication Administered:    -Ativan '2mg'$  po  Non pharmacological interventions attempted prior to PRN medication administration:   -dimmed lighting, deep breathing    Brief narrative of reason for PRN medication administration:     -Pt in room agitated, cursing loudly, pacing unit hallways at times.

## 2021-10-24 NOTE — Group Note
Name: Kristin Maynard   MRN: [id number]     DOB: 1996-06-29      Age: 26 y.o.  Admission Date: 09/23/2021     LOS: 31 days     Date of Service: 10/24/2021      Group Topic: BH Stages of Change  Group Date: 10/24/2021  Start Time: 1100  End Time: 1200  Facilitators: Lendon Ka, PhD          Number of Participants: 8  Group Focus: coping skills  Treatment Modality: Cognitive Behavioral Therapy  Interventions utilized were group exercise  Purpose: enhance coping skills and reinforce self-care          Name: Kristin Maynard Date of Birth: 1996/04/24   MR: [id number]      Level of Participation: minimal   Quality of Participation: supportive and withdrawn  Interactions with others: did not interact  Mood/Affect: blunted  Triggers (if applicable): NA  Cognition: coherent/clear  Progress: Minimal  Response: Limited; Patient reported feeling tired and appeared to be somnolent during session.   Plan: to continue treatment

## 2021-10-24 NOTE — Group Note
Name: Kristin Maynard   MRN: [id number]     DOB: 1996-05-09      Age: 26 y.o.  Admission Date: 09/23/2021     LOS: 31 days     Date of Service: 10/24/2021      Group Topic: BH Leisure Activities  Group Date: 10/24/2021  Start Time: 1250  End Time: 1330  Facilitators: Earl Lites          Number of Participants: 9  Group Focus: coping skills, leisure skills, self-awareness, self-esteem, and social skills  Treatment Modality: Leisure Development  Interventions utilized were leisure development and mental fitness  Purpose: enhance coping skills, improve communication skills, and reinforce self-care    Patients went outside to the garden area and listened to music, danced to music, hula hoop and tossed a ball. Patients enjoyed socializing with each other.      Name: Kristin Maynard Date of Birth: [birth year]   MR: [id number]      Level of Participation: withdrawn   Quality of Participation: passive  Interactions with others: talked to herself  Mood/Affect: blunted and flat  Cognition: loose  Progress: Minimal  Plan: to continue treatment

## 2021-10-24 NOTE — Care Plan
Problem: Health Maintenance - Impaired  Goal: Establish therapeutic relationships  Outcome: Goal Ongoing     Problem: Health Maintenance - Impaired  Goal: Knowledge of health maintenance  Outcome: Goal Ongoing     Problem: Coping - Ineffective, Family  Goal: Effective Coping  Outcome: Goal Ongoing

## 2021-10-24 NOTE — Progress Notes
Psychiatry Progress Note  Name: Kristin Maynard          MRN: [id number] DOB: 12-08-1995         Age: 26 y.o.  Admission Date: 09/23/2021  LOS: 31 days    Service: [facility] Adult Psych A    Principal Problem:    Schizophrenia (HCC)  Active Problems:    Pituitary macroadenoma (HCC)    Hyperprolactinemia (HCC)    Elevated BP without diagnosis of hypertension    Sinus tachycardia    Leukocytosis  R/o bipolar mood disorder, current episode manic with psychotic features       PLAN  1. Continued admission for stabilization  2. Continue Abilify 15mg  (decreased on 4/11)  3. Continue Depakote ER 1500mg  nightly for mood stabilization and sleep (started on 4/6)  1. Valproic acid level ordered for 4/9 and patient refused, ordered again on 4/11  4. Continue Valium 5mg  BID with an additional 5mg  qhs PRN if 5mg  qhs for sleep is ineffective   5. Discontinue cabergoline with last dose being on 4/10 as the benefits of discontinuing at this time exceed the risks in the setting of acute psychosis   6. Continue other medications as currently prescribed         REASONS FOR CONTINUED HOSPITALIZATION  Psychiatric stabilization and medication management.Patient has continued to display agitation, ongoing psychosis and delusions are interfering with her ability to maintain safety.      ______________________________________________________________  SUBJECTIVE  Kristin Maynard is seen in her room today where she was standing up and talking to unseen others. She reported the voices were not really there today and not bothering her. She continues to report VH of hydra. She denies SI/HI. She continues to ask about when discharge might occur but was more calm when told it would not be in the next few days. We discussed the importance of getting a depakote level and she was amenable.     Ativan 2mg  was given at 0410 when pt was agitated, cursing and pacing. No other PRNs have been given since yesterday morning. She slept a documented 5.75 hrs last night. REVIEW OF SYSTEMS  Review of Systems   Gastrointestinal: Negative for constipation, diarrhea and vomiting.   Musculoskeletal: Negative for back pain.   Skin: Negative for rash.   Neurological: Negative for dizziness and headaches.   Psychiatric/Behavioral: Positive for hallucinations. Negative for suicidal ideas. The patient is nervous/anxious. The patient does not have insomnia.      ______________________________________________________________  OBJECTIVE                 Vital Signs:  Current                Vital Signs: 24 Hour Range   BP: 134/98 (04/10 1937)  Temp: 36.2 ?C (97.2 ?F) (04/10 1937)  Pulse: 113 (04/10 1937)  Respirations: 18 PER MINUTE (04/10 1937)  SpO2: 98 % (04/10 1937) BP: (134-140)/(98)   Temp:  [36.2 ?C (97.2 ?F)]   Pulse:  [108-113]   Respirations:  [18 PER MINUTE]   SpO2:  [98 %]    Intensity Pain Scale (Self Report): (not recorded)      Scheduled Medications:  ARIPiprazole (ABILIFY) tablet 15 mg, 15 mg, Oral, QDAY  diazePAM (VALIUM) tablet 5 mg, 5 mg, Oral, BID  divalproex (DEPAKOTE ER) ER tablet 1,500 mg, 1,500 mg, Oral, QHS  levonorgestrel-ethinyl estradiol (AVIANE-28, LESSINA-28) tablet 1 tablet, 1 tablet, Oral, QDAY  melatonin tablet 5 mg, 5 mg, Oral, QHS  metoprolol tartrate tablet 25 mg, 25 mg, Oral, BID  QUEtiapine (SEROquel) tablet 800 mg, 800 mg, Oral, QHS        PRN Medications:  acetaminophen Q6H PRN 650 mg at 10/22/21 1154, calcium carbonate TID PRN, diazePAM QHS PRN, haloperidoL Q6H PRN **OR** haloperidol lactate Q6H PRN, hydrOXYzine Q6H PRN 25 mg at 10/22/21 2238, LORazepam Q6H PRN 2 mg at 10/24/21 0410 **OR** LORazepam  (ATIVAN)  injection Q6H PRN, ondansetron Q6H PRN 4 mg at 10/15/21 1343, polyethylene glycol 3350 QDAY PRN, senna/docusate QDAY PRN    MENTAL STATUS EXAMINATION  General/Constitutional: appears stated age, dressed appropriately, good hygiene   Eye Contact: good, intense   Behavior: mild agitation but directable, cooperative with assessment   Speech: RRR with normal volume and monotone. Good articulation  Mood: good  Affect: blunted   Thought Process: concrete   Thought Content: denies SI, denies HI.  Perception: Endorses VH of hydra and VH. Appears to be responding to stimuli   Associations: Intact  Insight/Judgment: poor/poor    Physical Exam:  Gait: Ambulates without difficulty. Normal arm swing     ______________________________________________________________  [name], DO

## 2021-10-24 NOTE — Care Plan
Problem: Mood - Altered  Goal: Stabilize mood  Flowsheets (Taken 10/23/2021 1336 by Champ Mungo, RN)  Stabilize mood: Assess depressive symptoms     Problem: Thought Process - Altered  Goal: Demonstration of organized thought processes  Flowsheets (Taken 10/21/2021 2318 by Darlin Drop, RN)  Demonstration of organized thought processes:   Assess cognitive ability   Provide environmental regulation - agitation   Assess disturbed thought process signs and symptoms   Assess in reality orientation   Assess risk for impaired cognition   Assess thought process     Problem: Coping - Ineffective, Family  Goal: Effective Coping  Flowsheets (Taken 10/17/2021 2343)  Effective coping:   Assess support system   Provide coping support

## 2021-10-24 NOTE — Progress Notes
A medication education group was performed today, and Kristin Maynard attended the entire duration of the group.  They occasionally participated in group and demonstrated fair understanding of material discussed.    Common psychiatric medications were reviewed for indications, side effects, and onset of effect.  Methods for medication adherence were addressed with group.      The patient had no specific questions     Will discuss with team in rounds.      Group Leader:  Elizabeth Palau

## 2021-10-24 NOTE — Progress Notes
Psychotherapy Progress Note    NAME:Aerilynn Sabrena Gavitt MRN: [id number] DOB:1996-05-12 AGE: 26 y.o.  ADMISSION DATE: 09/23/2021 DAYS ADMITTED: LOS: 31 days    Date of Service:  10/24/21    Principal Problem:    Schizophrenia ([facility])  Active Problems:    Pituitary macroadenoma ([facility])    Hyperprolactinemia ([facility])    Elevated BP without diagnosis of hypertension    Sinus tachycardia    Leukocytosis    Objective:  Lagina will meet with therapist to discuss progress toward reality orientation, appropriate affect, organized thought, and improved relational skills.     Narrative:  SW met with patient in patient's room for individual therapy session. Patient reports feeling slightly better, stating she isn't having any nightmares. Patient states that is one way for her to measure her baseline. SW and patient discussed grounding techniques and reality orientation. Patient states she uses her 5 senses to determine if something is real or not, and gave an example using the towel on her floor, stating she would touch it to ensure it was actually there. Patient states she usually watches movies as a way to cope, or will talk to her best friend, Arrie Eastern. Patient began falling asleep at this time, so session was ended.     Follow up:  Continue to follow-up.

## 2021-10-24 NOTE — Group Note
Name: Kristin Maynard   MRN: [id number]     DOB: 02/12/1996      Age: 26 y.o.  Admission Date: 09/23/2021     LOS: 31 days     Date of Service: 10/24/2021      Group Topic: BH Positive Problem Solving  Group Date: 10/24/2021  Start Time: 1015  End Time: 1100  Facilitators: Candie Chroman          Number of Participants: 13  Group Focus: feeling awareness/expression and problem solving  Treatment Modality: Art Therapy  Interventions utilized were assignment and exploration  Purpose: enhance coping skills and express feelings  Group members were provided with a variety of papers, glue sticks, and oil pastels and encouraged to create an abstract collage about something they enjoy. Activity is intended to promote creative expression, problem solving, coping skills, and healthy leisure interests.           Name: Kristin Maynard Date of Birth: May 09, 1996   MR: [id number]      Level of Participation: moderate   Quality of Participation: cooperative  Interactions with others: pulled from group for clinical purposes  Mood/Affect: flat  Triggers (if applicable): NA  Cognition: no insight  Progress: Moderate  Response: Observed drawing and responding to internal stimuli while present  Plan: to continue treatment

## 2021-10-25 MED ORDER — FOLIC ACID 1 MG PO TAB
1 mg | Freq: Every day | ORAL | 0 refills | Status: DC
Start: 2021-10-25 — End: 2021-11-02
  Administered 2021-10-25 – 2021-11-02 (×9): 1 mg via ORAL

## 2021-10-25 NOTE — Care Plan
Problem: Mood - Altered  Goal: Stabilize mood  Outcome: Goal Ongoing  Flowsheets (Taken 10/25/2021 1002)  Stabilize mood: Assess depressive symptoms     Problem: Health Maintenance - Impaired  Goal: Able to perfom ADL's  Outcome: Goal Ongoing  Flowsheets (Taken 10/25/2021 1002)  Able to perform ADLs: Assess for pain  Goal: Adequate nutritional intake  Outcome: Goal Ongoing  Flowsheets (Taken 10/25/2021 1002)  Adequate nutritional intake: Promote proper nutrition

## 2021-10-25 NOTE — Group Note
Name: Kristin Maynard   MRN: [id number]     DOB: [birth year]      Age: 26 y.o.  Admission Date: 09/23/2021     LOS: 32 days     Date of Service: 10/25/2021      Group Topic: BH Relaxation and Self-Soothing  Group Date: 10/25/2021  Start Time: 1315  End Time: 1420  Facilitators: Candie Chroman          Number of Participants: 10  Group Focus: relaxation  Treatment Modality: Art Therapy  Interventions utilized were assignment and exploration  Purpose: enhance coping skills          Name: [name] Date of Birth: Oct 24, 1995   MR: [id number]      Level of Participation: moderate   Quality of Participation: cooperative  Interactions with others: gave feedback  Mood/Affect: labile  Triggers (if applicable): NA  Cognition: no insight  Progress: Moderate  Response: Patient minimally engaged in art making and was observed talking to self in angry tone.  Plan: to continue treatment

## 2021-10-25 NOTE — Care Coordination-Inpatient
Care Coordination - Inpatient Note      Patient Name:   Kristin Maynard                        MRN:  [id number]  Admission Date:  09/23/2021    11:33-11:40am  CM spoke with patient's mom/DPOA, [name] [phone]), to provide an update on patient's progress. [name] states the patient was talking to her about the good voices and the [place] last week. CM informed [name] that patient is still endorsing some AVH but it has improved. CM and [name] discussed medication changes, specifically regarding her endocrinology medication to ensure her psychotropics are effective. [name] verbalized understanding and asked this writer to tell the patient to call her. CM ensured any additional questions/concerns were addressed prior to ending phone call. Will follow-up as needed.     12:12-12:23pm  CM spoke with patient's dad/DPOA, [name] [phone]), to provide an update on patient's progress and tentative discharge Friday. [name] states he plans on visiting tomorrow night with the patient's brother. CM and [name] discussed tentative discharge around 1:30pm on Friday. CM and [name] discussed medication changes, specifically regarding her endocrinology medication to ensure her psychotropics are effective. [name] asked about the patient's tumor and the need for neurosurgery. CM to reach out to providing attending to discuss further. CM offered to facilitate a family meeting prior to discharge. [name] states he will reach out to patient's mom and will follow-up with this Clinical research associate.     12:34-12:41pm  CM spoke with [name] at the[facility]in [place]?[phone]) to reschedule patient's aftercare services. Patient is scheduled for therapy on 10/31/21 @ 9:30am with [name]. This will be in-person at the [place] office. Patient is scheduled for psychiatry appointment on 11/15/21 @ 9:20am with [name], and this will be via telehealth. CM to inform patient and her parents and will fax to [phone] upon discharge. [name]  10/25/2021

## 2021-10-25 NOTE — Group Note
Name: Akemi Overholser   MRN: [id number]     DOB: January 30, 1996      Age: 26 y.o.  Admission Date: 09/23/2021     LOS: 32 days     Date of Service: 10/25/2021      Group Topic: [place] Leisure Activities  Group Date: 10/25/2021  Start Time: 8206  End Time: 1300  Facilitators: Earl Lites          Number of Participants: 6  Group Focus: coping skills, leisure skills, and social skills  Treatment Modality: Recreation Therapy  Interventions utilized were leisure development  Purpose: enhance coping skills, improve communication skills, and reinforce self-care    Patients participated in Pet therapy. Patients took turns petting the therapy dog and sharing stories about their pets.      Name: Mazie Date of Birth: May 22, 1996   MR: [id number]      Level of Participation: active   Quality of Participation: attentive and cooperative  Interactions with others: gave feedback  Mood/Affect: appropriate and flat  Cognition: goal directed  Progress: Moderate  Response: Patient asked the volunteer questions about her dog and talked about her pets from the past.  Plan: to continue treatment

## 2021-10-25 NOTE — Group Note
Name: Kristin Maynard   MRN: [id number]     DOB: 01/11/1996      Age: 26 y.o.  Admission Date: 09/23/2021     LOS: 32 days     Date of Service: 10/25/2021      Group Topic: BH Coping Skills  Group Date: 10/25/2021  Start Time: 1015  End Time: 1100  Facilitators: Garner Nash          Number of Participants: 11  Group Focus: communication, concentration, coping skills, feeling awareness/expression, leisure skills, music therapy, relaxation, reminiscence, and social skills  Treatment Modality: Music Therapy  Interventions utilized were other Emotions and Music  Purpose: enhance coping skills, express feelings, improve communication skills, increase insight, and reinforce self-care    Emotions and Music: The therapist facilitated a discussion about how music can be used as a coping skill when experiencing and connecting with emotions. The group discussed how music can influence emotions as well. The therapist then provided the patients with circle that had the seven ?main? emotions and a blank outer circle; patients were then instructed to write down songs that reflected these emotions. The group then listened to songs they had written and other group members discussed their emotions surrounding it; the patient who had requested the song then shared their feelings with the song.      Name: Kristin Maynard Date of Birth: 1995-10-20   MR: [id number]      Level of Participation: minimal   Quality of Participation: disruptive, distractible and restless  Interactions with others: did not interact  Mood/Affect: labile  Cognition: confused and Responding to internal stimuli  Progress: None  Response: The patient minimally participated within the group. The patient briefly interacted with materials before tearing materials up and throwing them away. The patient was responding to internal stimuli throughout the group. While in group, the patient exhibited very labile mood - tearful, agitated, anxious, and smiling.  Plan: to continue treatment

## 2021-10-25 NOTE — Behavioral Health Treatment Team
TREATMENT TEAM NOTE  Name: Kristin Maynard          MRN: [id number]              DOB: October 19, 1995          Age: 26 y.o.  Admission Date: 09/23/2021                 Attendees:   Psychiatrist: Arnette Norris, DO  Nurse Supervisor: Cyndia Skeeters, RN, BSN  Nurse: Dede Query, RN  Pharmacist: Norvel Richards, PharmD  Therapist: Dewayne Hatch, [place], [place]  Therapist: Neita Carp, [place]  Case Manager: Carmela Rima, LMSW  Case Manager: April Cordero, [place]  Utilization Review: Areatha Keas, RN    Significant Points Discussed:   AVH  Responding to internal stimuli  No PRNs  Slept 10.25 hours  Depakote level done yesterday    Projected Discharge Date:  Discharge

## 2021-10-25 NOTE — Progress Notes
Case Management Progress Note    NAME:Kristin Maynard MRN: [id number] DOB:18-Jul-1995 AGE: 26 y.o.  ADMISSION DATE: 09/23/2021 DAYS ADMITTED: LOS: 32 days     Date of Service: 10/25/2021  Service Start Time:  3:05pm  Service End Time:  3:11pm    CM met with patient to check-in and discuss case management needs. CM informed patient about phone call with her parents, and them wanting her to call. Patient verbalized understanding and reports she is doing better, as evidenced by her reporting to have a better sleep schedule and eating better. Patient states she is still "kinda" hearing voices. CM asked patient to elaborate on what she means about "mastering" the voices. Per patient, this means that she can tell the voices to be quiet and they listen to her. Patient states her voices are being calmer and nicer. CM and reviewed patient's aftercare appointments and tentative discharge on Friday. Patient reports no additional questions/concerns at this time and was able to return to her room with no issue. Upon entering her room, patient immediately began responding to internal stimuli, stating "What the hell happened in here you guys, I was only gone for five minutes...", and continued talking to unseen others as this writer left the unit. CM to continue meeting with patient to complete safety plan and finalize discharge.

## 2021-10-25 NOTE — Group Note
Name: Rylynn Kobs   MRN: [id number]     DOB: [birth year]      Age: 26 y.o.  Admission Date: 09/23/2021     LOS: 32 days     Date of Service: 10/25/2021      Group Topic: BH Cognitive Distortions  Group Date: 10/25/2021  Start Time: 1100  End Time: 1200  Facilitators: Lendon Ka, PhD          Number of Participants: 8  Group Focus: coping skills  Treatment Modality: Cognitive Behavioral Therapy  Interventions utilized were group exercise  Purpose: enhance coping skills, explore maladaptive thinking, and reinforce self-care          Name: Reanna Date of Birth: 1995-10-28   MR: [id number]      Level of Participation: minimal   Quality of Participation: restless  Interactions with others: did not interact  Mood/Affect: restless  Triggers (if applicable): NA  Cognition: tracking difficulty  Progress: Minimal  Response: Limited; Patient having trouble engaging in session and appearing to respond to internal stimuli/speak to unseen others.   Plan: to continue treatment

## 2021-10-25 NOTE — Group Note
Name: Kristin Maynard   MRN: [id number]     DOB: [birth year]      Age: 26 y.o.  Admission Date: 09/23/2021     LOS: 32 days     Date of Service: 10/25/2021      Group Topic: [place] Leisure Activities  Group Date: 10/25/2021  Start Time: 0915  End Time: 1000  Facilitators: Earl Lites          Number of Participants:10  Group Focus: coping skills, leisure skills, and social skills  Treatment Modality: Recreation Therapy  Interventions utilized were group exercise, leisure development, and mental fitness  Purpose: enhance coping skills, improve communication skills, regain self-worth, and reinforce self-care    Patients exercised to music. Patients divided into groups and learned a new counting card game called "48."       Name: Kristin Maynard Date of Birth: 26-Jan-1996   MR: [id number]      Level of Participation: moderate   Quality of Participation: attentive and cooperative during exercises. Patient likes to exercise. Patient was in and out of group when the group was playing cards.   Interactions with others: minimal and pulled from group for clinical purposes  Mood/Affect: appropriate and flat  Cognition: goal directed and loose at times.  Progress: Moderate  Response: Patient was able to remain in group for most of the time which is good for her. Patient also did not talk to herself.  Plan: to continue treatment

## 2021-10-25 NOTE — Care Plan
Problem: Mood - Altered  Goal: Stabilize mood  Flowsheets (Taken 10/23/2021 1336 by Champ Mungo, RN)  Stabilize mood: Assess depressive symptoms     Problem: Thought Process - Altered  Goal: Demonstration of organized thought processes  Flowsheets (Taken 10/21/2021 2318 by Darlin Drop, RN)  Demonstration of organized thought processes:   Assess cognitive ability   Provide environmental regulation - agitation   Assess disturbed thought process signs and symptoms   Assess in reality orientation   Assess risk for impaired cognition   Assess thought process     Problem: Health Maintenance - Impaired  Goal: Establish therapeutic relationships  Flowsheets (Taken 10/20/2021 2340 by Darlin Drop, RN)  Establish therapeutic relationships:   Encourage expressions of feelings   Involve patient in decision-making process

## 2021-10-25 NOTE — Progress Notes
Psychiatry Progress Note  Name: Kristin Maynard          MRN: [id number] DOB: 1995/09/29         Age: 26 y.o.  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 Continued admission for stabilization  2. Initiate folic acid 1gm daily in setting of pt of child bearing age on Depakote   3. Discussed depo provera injections with patient and she is not amenable at this time due to irritability from it in the past. Educated patient on taking OCP daily and patient denies being sexually active in many years   4. Continue Abilify 15mg  (decreased on 4/11)  5. Continue Depakote ER 1500mg  nightly for mood stabilization and sleep (started on 4/6)  1. Valproic acid level 4/11 was 115 and likely falsely elevated as it was not a true trough   6. Continue Valium 5mg  BID with an additional 5mg  qhs PRN if 5mg  qhs for sleep is ineffective   7. Discontinue cabergoline with last dose being on 4/10 as the benefits of discontinuing at this time exceed the risks in the setting of acute psychosis   8. Continue other medications as currently prescribed         REASONS FOR CONTINUED HOSPITALIZATION  Psychiatric stabilization and medication management.Patient has continued to display agitation, ongoing psychosis and delusions are interfering with her ability to maintain safety.      ______________________________________________________________  SUBJECTIVE  Kristin Maynard is seen in the consult room today.  She reports feeling pretty good today.  She describes the voices as being more manageable and that she has mastered all of them.  When asked if she is currently experiencing them she reports not really.  She denies SI/HI/AVH.    We discussed the need for birth control in the setting of being on Depakote.  She reports having good compliance with taking her OCP.  We discussed the Depo shot and she reported having it years ago but it made her angry.  She was unwilling to try it again at this time.  She states she has not been sexually active in years.    Kristin Maynard has been reporting visual hallucinations of unicorns to others on the team.  She continues to be observed responding to stimuli.  She did not require any as needed medications yesterday and slept a documented 10.25 hours.  She did not have any nightmares and did reasonably well with reality testing yesterday.    REVIEW OF SYSTEMS  Review of Systems   Gastrointestinal: Negative for constipation, diarrhea and vomiting.   Musculoskeletal: Negative for back pain.   Skin: Negative for rash.   Neurological: Negative for dizziness and headaches.   Psychiatric/Behavioral: Positive for hallucinations. Negative for suicidal ideas. The patient does not have insomnia.      ______________________________________________________________  OBJECTIVE                 Vital Signs:  Current                Vital Signs: 24 Hour Range   BP: 116/71 (04/12 0800)  Temp:  36.2 ?C (97.1 ?F) (04/12 0800)  Pulse: 103 (04/12 0800)  Respirations: 16 PER MINUTE (04/12 0800)  SpO2: 100 % (04/12 0800) BP: (116-135)/(71-91)   Temp:  [36.2 ?C (97.1 ?F)-36.7 ?C (98.1 ?F)]   Pulse:  [103-122]   Respirations:  [16 PER MINUTE-20 PER MINUTE]   SpO2:  [98 %-100 %]    Intensity Pain Scale (Self Report): (not recorded)      Scheduled Medications:  ARIPiprazole (ABILIFY) tablet 15 mg, 15 mg, Oral, QDAY  diazePAM (VALIUM) tablet 5 mg, 5 mg, Oral, BID  divalproex (DEPAKOTE ER) ER tablet 1,500 mg, 1,500 mg, Oral, QHS  folic acid (FOLVITE) tablet 1 mg, 1 mg, Oral, QDAY  levonorgestrel-ethinyl estradiol (AVIANE-28, LESSINA-28) tablet 1 tablet, 1 tablet, Oral, QDAY  melatonin tablet 5 mg, 5 mg, Oral, QHS  metoprolol tartrate tablet 25 mg, 25 mg, Oral, BID  QUEtiapine (SEROquel) tablet 800 mg, 800 mg, Oral, QHS        PRN Medications:  acetaminophen Q6H PRN 650 mg at 10/22/21 1154, calcium carbonate TID PRN, diazePAM QHS PRN, haloperidoL Q6H PRN **OR** haloperidol lactate Q6H PRN, hydrOXYzine Q6H PRN 25 mg at 10/22/21 2238, LORazepam Q6H PRN 2 mg at 10/24/21 0410 **OR** LORazepam  (ATIVAN)  injection Q6H PRN, ondansetron Q6H PRN 4 mg at 10/15/21 1343, polyethylene glycol 3350 QDAY PRN, senna/docusate QDAY PRN    MENTAL STATUS EXAMINATION  General/Constitutional: appears stated age, dressed appropriately, good hygiene   Eye Contact: good  Behavior: calm and cooperative   Speech: RRR with normal volume and monotone. Good articulation  Mood: good  Affect: restricted   Thought Process: concrete   Thought Content: denies SI, denies HI.  Perception: Denies VH. Reports AH as not really  Associations: Intact  Insight/Judgment: improving/fair    Physical Exam:  Gait: Ambulates without difficulty. Normal arm swing     ______________________________________________________________  Kristin Main, DO

## 2021-10-26 MED ORDER — ARIPIPRAZOLE 15 MG PO TAB
15 mg | ORAL_TABLET | Freq: Every day | ORAL | 0 refills | Status: CN
Start: 2021-10-26 — End: ?

## 2021-10-26 MED ORDER — DIAZEPAM 5 MG PO TAB
5 mg | ORAL_TABLET | Freq: Two times a day (BID) | ORAL | 0 refills | Status: CN
Start: 2021-10-26 — End: ?

## 2021-10-26 MED ORDER — VOLNEA (28) 0.15-0.02 MGX21 /0.01 MG X 5 PO TAB
1 | ORAL_TABLET | Freq: Every day | ORAL | 0 refills | Status: CN
Start: 2021-10-26 — End: ?

## 2021-10-26 NOTE — Care Plan
Problem: Thought Process - Altered  Goal: Demonstration of organized thought processes  Outcome: Goal Ongoing  Flowsheets (Taken 10/26/2021 1013)  Demonstration of organized thought processes: Assess thought process     Problem: Health Maintenance - Impaired  Goal: Able to perfom ADL's  Outcome: Goal Ongoing  Flowsheets (Taken 10/26/2021 1013)  Able to perform ADLs: Assess for pain  Goal: Adequate nutritional intake  Outcome: Goal Ongoing  Flowsheets (Taken 10/26/2021 1013)  Adequate nutritional intake: Promote proper nutrition

## 2021-10-26 NOTE — Behavioral Health Treatment Team
TREATMENT TEAM NOTE  Name: Kristin Maynard          MRN: [id number]              DOB: Sep 06, 1995          Age: 26 y.o.  Admission Date: 09/23/2021                 Attendees:   Psychiatrist: Arnette Norris, DO  Nurse Supervisor: Cyndia Skeeters, RN, BSN  Nurse: Lucius Conn, RN  Pharmacist: Norvel Richards, PharmD  Therapist: Dewayne Hatch, [place], [place]  Therapist: Neita Carp, [place]  Case Manager: Carmela Rima, LMSW  Case Manager: Tinnie Gens, LMSW  Case Manager: April Cordero, [place]  Utilization Review: Areatha Keas, RN      Significant Points Discussed: anxiety and depression=2; HI towards a monster; AVH of 4-5 bodyguards; responding to internal stimuli; pacing and talking to unseen others       Treatment Plan Discussion: continue treatment       Projected Discharge Date: Friday

## 2021-10-26 NOTE — Group Note
Name: Kristin Maynard   MRN: [id number]     DOB: August 05, 1995      Age: 26 y.o.  Admission Date: 09/23/2021     LOS: 33 days     Date of Service: 10/26/2021      Group Topic: BH Distress Tolerance (DBT Skill)  Group Date: 10/26/2021  Start Time: 1105  End Time: 1200  Facilitators: Dewayne Hatch          Number of Participants: 12  Group Focus: coping skills, feeling awareness/expression, personal responsibility, and self-awareness  Treatment Modality: Dialectical Behavioral Therapy, Patient-Centered Therapy, and Psychoeducation  Interventions utilized were clarification, exploration, patient education, and story telling  Purpose: enhance coping skills, explore maladaptive thinking, increase insight, and regain self-worth    Provider asked group for topics to discuss during group that relate to their mental health or mental health as a larger topic. Provider lead group in discussion of the following topics:   Coping skills   Distress tolerance   Feeling better   Grounding          Name: Kristin Maynard Date of Birth: 1995/11/20   MR: [id number]      Level of Participation: minimal   Quality of Participation: withdrawn  Interactions with others: did not interact  Mood/Affect: appropriate  Triggers (if applicable): None  Cognition: confused and not focused  Progress: Minimal  Response: Present for last couple minutes.  Plan: to continue treatment

## 2021-10-26 NOTE — Progress Notes
PRN Medication Administered:  valium  Non pharmacological interventions attempted prior to PRN medication administration:  Pt was encouraged to use relaxation techniques.     Brief narrative of reason for PRN medication administration: Pt was given prn Valium po at 2010, at stating that she could not sleep at night and she wanted to go to bed.

## 2021-10-26 NOTE — Group Note
Name: Galilea Quito   MRN: [id number]     DOB: 07-26-1995      Age: 26 y.o.  Admission Date: 09/23/2021     LOS: 33 days     Date of Service: 10/26/2021      Group Topic: BH Leisure Activities  Group Date: 10/26/2021  Start Time: 1345  End Time: 1445  Facilitators: Earl Lites          Number of Participants: 11  Group Focus: coping skills, leisure skills, self-esteem, and social skills  Treatment Modality: Recreation Therapy  Interventions utilized were group exercise and leisure development  Purpose: enhance coping skills, improve communication skills, and reinforce self-care    Patients divided into teams and played team Sequence. Patients enjoyed the game.      Name: Katerra Date of Birth: February 21, 1996   MR: [id number]      Level of Participation: minimal   Quality of Participation: restless  Interactions with others: gave feedback  Mood/Affect: restless  Cognition: loose and not focused  Progress: Minimal  Response: Patient started playing the game and after about 10-15 minutes she got up and left the group.  Plan: to continue treatment

## 2021-10-26 NOTE — Care Plan
Problem: Harm to self, high risk of suicide  Goal: Absence of Harm to Self  Flowsheets (Taken 10/25/2021 2142)  Absence of harm to self:   Assess and report significant changes in mood and affect   Complete the environmental risk assessment   Complete safety plan   Initiate constant observation until cleared by psychiatry   Maintain a safe environment   Reassess for suicide risk daily     Problem: Mood - Altered  Goal: Stabilize mood  Flowsheets (Taken 10/25/2021 2142)  Stabilize mood:   Assess coping style   Assess ineffective coping signs and symptoms   Assess depression history   Establish therapeutic relationships   Assess depressive symptoms     Problem: Violence, self/other-directed, Risk of  Goal: Absence of violence  Flowsheets (Taken 10/25/2021 2142)  Absence of violence:   Suicide assessment   Suicide precautions   Violent behavior risk assessment   Environmental safety management   Supervise medication intake     Problem: Cognitive-Perceptual Pattern - Impaired  Goal: Knowledge of prescribed medication  Flowsheets (Taken 10/25/2021 2142)  Knowledge of prescribed medication:   Provide prescribed medication education   Adherence to medication schedule

## 2021-10-26 NOTE — Group Note
Name: Kristin Maynard   MRN: [id number]     DOB: 11-21-1995      Age: 26 y.o.  Admission Date: 09/23/2021     LOS: 33 days     Date of Service: 10/26/2021      Group Topic: BH Leisure Activities  Group Date: 10/26/2021  Start Time: 0915  End Time: 1015  Facilitators: Harrell Lark          Number of Participants: 11  Group Focus: communication, coping skills, feeling awareness/expression, healthy friendships, leisure skills, music therapy, problem solving, relaxation, reminiscence, and social skills  Treatment Modality: Music Therapy  Interventions utilized were other Mixtape  Purpose: enhance coping skills, express feelings, improve communication skills, increase insight, and reinforce self-care    Mixtape: The patients played the game "Mixtape" in which patients drew a scenario card and chose a song fitting that scenario. The purpose of this intervention was to increase coping skills, leisure skills, communication, and feeling awareness/expression.      Name: [name] Date of Birth: 1995-09-24   MR: [id number]      Level of Participation: moderate   Quality of Participation: cooperative, passive and responding to internal stimuli  Interactions with others: gave feedback and pulled from group for clinical purposes  Mood/Affect: labile  Cognition: confused, not focused and Responding to internal stimuli  Progress: Moderate  Response: The patient moderately engaged within the group. The patient provided a song within the group. During the group, the patient was pulled for clinical purposes. Throughout the group, the patient was observed responding to internal stimuli and becoming aggitated from stimuli  Plan: to continue treatment

## 2021-10-27 MED ORDER — DIAZEPAM 5 MG PO TAB
10 mg | Freq: Every evening | ORAL | 0 refills | Status: DC
Start: 2021-10-27 — End: 2021-11-02
  Administered 2021-10-28 – 2021-11-02 (×6): 10 mg via ORAL

## 2021-10-27 MED ORDER — FLUPHENAZINE HCL 2.5 MG/ML IJ SOLN
1.25 mg | INTRAMUSCULAR | 0 refills | Status: DC | PRN
Start: 2021-10-27 — End: 2021-10-29

## 2021-10-27 MED ORDER — FLUPHENAZINE HCL 5 MG PO TAB
2.5 mg | ORAL | 0 refills | Status: DC | PRN
Start: 2021-10-27 — End: 2021-10-29

## 2021-10-27 MED ORDER — DIAZEPAM 5 MG PO TAB
5 mg | Freq: Every day | ORAL | 0 refills | Status: DC
Start: 2021-10-27 — End: 2021-11-02
  Administered 2021-10-28 – 2021-11-02 (×6): 5 mg via ORAL

## 2021-10-27 MED ORDER — FLUPHENAZINE HCL 5 MG PO TAB
2.5 mg | Freq: Every evening | ORAL | 0 refills | Status: DC
Start: 2021-10-27 — End: 2021-10-30
  Administered 2021-10-28 – 2021-10-30 (×3): 2.5 mg via ORAL

## 2021-10-27 NOTE — Progress Notes
PRN Medication Administered:  Haldol '5mg'$  PO  Non pharmacological interventions attempted prior to PRN medication administration:  Verbal de-escalation techniques and Decrease stimuli    Brief narrative of reason for PRN medication administration:   At start of shift, Mishayla was responding to internal stimuli during her visit with her father. After the visit, she continued intensely speaking with unseen others while pacing the hall and making gestures that coincided with her conversations. She was then heard speaking with unseen others loudly in her room as well as kind of pushing on the wall. Kelsee previously declined a PRN when asked, however when phrased, "Can I give you a medication to help?", she was agreeable with no issues. After med administration, pt was encouraged to get more water from the cafeteria. Upon leaving the room, pt stated to unseen other, "I know this is important, but I think I should get water, I'll be right back". However, she then only made it halfway down the hall where she continued her intense conversation and gestures with unseen others. Pt is now semi-quietly singing outside of UC station in the hallway.

## 2021-10-27 NOTE — Care Plan
Problem: Mood - Altered  Goal: Stabilize mood  Flowsheets (Taken 10/25/2021 2142 by Lesia Sago, RN)  Stabilize mood:   Assess coping style   Assess ineffective coping signs and symptoms   Assess depression history   Establish therapeutic relationships   Assess depressive symptoms     Problem: Thought Process - Altered  Goal: Demonstration of organized thought processes  Flowsheets (Taken 10/26/2021 1013 by Champ Mungo, RN)  Demonstration of organized thought processes: Assess thought process     Problem: Health Maintenance - Impaired  Goal: Establish therapeutic relationships  Flowsheets (Taken 10/20/2021 2340 by Darlin Drop, RN)  Establish therapeutic relationships:   Encourage expressions of feelings   Involve patient in decision-making process

## 2021-10-27 NOTE — Care Plan
Problem: Mood - Altered  Goal: Stabilize mood  Outcome: Goal Ongoing     Problem: Mood - Altered  Goal: Knowledge of Altered Mood  Outcome: Goal Ongoing     Problem: Health Maintenance - Impaired  Goal: Adequate nutritional intake  Outcome: Goal Ongoing

## 2021-10-27 NOTE — Group Note
Name: Kristin Maynard   MRN: [id number]     DOB: Mar 13, 1996      Age: 26 y.o.  Admission Date: 09/23/2021     LOS: 34 days     Date of Service: 10/27/2021      Group Topic: BH Connection  Group Date: 10/27/2021  Start Time: 1245  End Time: 1340  Facilitators: Garner Nash          Number of Participants: 12  Group Focus: communication, concentration, coping skills, feeling awareness/expression, healthy friendships, leisure skills, relaxation, reminiscence, and social skills  Treatment Modality: Music Therapy  Interventions utilized were other Musical Guess Who  Purpose: enhance coping skills, express feelings, improve communication skills, and reinforce self-care    Musical Guess Who - The MT supplied the patients will pens and paper. On it, the MT had the patients write down a strength, types of music they enjoy and one song that they feel describes them. The MT then put these pieces of paper into the cup and drew one at random. The MT then read the card, played the song and afterwards invited the clients to guess who they thought selected the song. The MT then facilitated a discussion about making connections with others and why each person selected the song they did. The purpose of this intervention was to enhance social skills, self-care, leisure development, and make connections.      Name: Kristin Maynard Date of Birth: Nov 18, 1995   MR: [id number]      Level of Participation: minimal   Quality of Participation: cooperative, disruptive, distractible and restless  Interactions with others: gave feedback  Mood/Affect: labile  Cognition: confused and not focused  Progress: Minimal  Response: The patient minimally engaged within the group. The patient provided completed paper to participate within group intervention and provided feedback when directly prompted. Throughout the group, the patient was responding to internal stimuli, occasionally becoming agitated or tearful - occasionally paced the milieu; responses occasionally became disruptive to concentration of others.  Plan: to continue treatment

## 2021-10-27 NOTE — Group Note
Name: Kristin Maynard   MRN: [id number]     DOB: 01-27-1996      Age: 26 y.o.  Admission Date: 09/23/2021     LOS: 34 days     Date of Service: 10/27/2021      Group Topic: BH Mindfulness  Group Date: 10/27/2021  Start Time: 0900  End Time: 1030  Facilitators: Candie Chroman          Number of Participants: 10  Group Focus: coping skills  Treatment Modality: Art Therapy  Interventions utilized were assignment and exploration  Purpose: enhance coping skills    Group members were provided education on [employer], and encouraged to experiment with mark-making and value scales to create a landscape painting.           Name: [name] Date of Birth: 1995/08/22   MR: [id number]      Level of Participation: active   Quality of Participation: disruptive  Interactions with others: when spoken to  Mood/Affect: labile  Triggers (if applicable): NA  Cognition: responding to internal stimuli for the duration of group in an angry tone at times  Progress: Moderate  Response: Engaged in art making while responding to internal stimuli.  Plan: to continue treatment

## 2021-10-27 NOTE — Care Coordination-Inpatient
Care Coordination - Inpatient Note      Patient Name:   Kristin Maynard                        MRN:  [id number]  Admission Date:  09/23/2021    9:26-9:39am  CM and Psychiatrist spoke with patient's dad/DPOA, Kristin Maynard [phone]), to get an update on his visit with patient and discuss discharge. Per Kristin Maynard, during the visit patient was screaming at unicorns in the bathroom stating she must be discharged tomorrow or you will be doomed. Kristin Maynard states the patient became agitated and began cursing at him. Kristin Maynard states he told the patient she cannot discharge in this state. Patient reports to Kristin Maynard that she will be fine when I get home but also told him she won't take anymore than 9 pills. Patient told Kristin Maynard she has been telling them what they need to know but also told him she needs to stay here longer. Psychiatrist reviewed with Kristin Maynard the medication changes, including stopping the tumor medications and adding Depakote. CM and Psychiatrist discuss patient's ability to have a linear conversation with providers but continuing to respond to internal stimuli. Patient appears to have more insight into her need of inpatient hospitalization. Kristin Maynard states the patient had been at [facility] for 2-3 months, and provided verbal consent for this writer to request patient's records. Kristin Maynard reports the patient isn't talking about the demons so much, but now is talking the unicorns asking if I'm going go take it away. CM and Psychiatrist processed further. Kristin Maynard reminded providers that patient's younger sisters are moving in. Kristin Maynard states the patient won't hurt anyone but does have unsafe behaviors, such as running away. CM and Psychiatrist dicussed holding off discharge until patient is more stabilized and ensured any additional questions/concerns were addressed prior to ending phone call. Kristin Maynard to inform patient's mom. CM to follow-up as needed.     9:43am  CM attempted to fax a records request to [facility] (f: [phone]) for patient's records from previous admissions. Fax was busy. Will try again later.     9:51am  CM emailed [employer] ([email or web address]) for a copy of patient's records from her previous admission at [facility]. Will follow-up.     10:58am  CM received return fax from Kristin Maynard stating the following, Kristin Maynard, I have received your request for medical records for this patient. We no longer have a contract with the [facility] so I will a ROI signed by the patient to send any records. Please contact me with any questions.. CM to follow-up.     1:22-1:27pm  CM spoke with Kristin Maynard [phone]) from OSH to discuss patient's records request. CM informed Kristin Maynard that patient's father/DPOA provided verbal consent for this writer to request patient's records. Per Kristin Maynard, she will need to send this information to their legal team to confirm the validity of the DPOA prior to sending records, so will need patient's father to sign ROI. CM asked if verbal consent is okay with witness signatures. Kristin Maynard asked her supervisor who reports that should work. CM to follow-up.     1:37pm  CM faxed an updated records request to [facility] (f: [phone]) for patient's records from previous admissions. Patient's DPOA paperwork and signed ROI were attached. Will follow-up.     1:39pm  CM sent updated email to [facility] [email or web address]) for a copy of patient's records from her previous admission at [facility]. Patient's  DPOA paperwork and signed ROI were attached. Will follow-up.     2:09pm  CM received the following return email from Kristin Maynard, [place] Kristin Maynard. I sent this DPOA to my legal team and they noticed page 3 has a different patient label attached. Is there a page missing?. CM to follow-up.     2:12pm  CM emailed Kristin Maynard to inform her that the DPOA paperwork may have been completed during a different admission, so has a different label. Will continue to follow-up.     [name]  10/27/2021

## 2021-10-27 NOTE — Progress Notes
Psychiatry Progress Note  Name: Kristin Maynard          MRN: [id number] DOB: 19-Sep-1995         Age: 26 y.o.  Admission Date: 09/23/2021  LOS: 34 days    Service: [facility] Adult Psych [facility]    Principal Problem:    Schizophrenia (HCC)  Active Problems:    Pituitary macroadenoma (HCC)    Hyperprolactinemia (HCC)    Elevated BP without diagnosis of hypertension    Sinus tachycardia    Leukocytosis  R/o bipolar mood disorder, current episode manic with psychotic features       PLAN  1. Continued admission for stabilization  2. Continue folic acid 1gm daily in setting of pt of child bearing age on Depakote   3. Continue Valium 5mg  qday and increase to Valium 10mg  qhs   4. Initiate fluphenazine 2.5mg  qhs and change PRNs to reflect this change  5. Discontinue Abilify as benefit has not been demonstrated and in spite of continued use prolactin level continues to increase   6. Continue Depakote ER 1500mg  nightly for mood stabilization and sleep (started on 4/6)  1. Valproic acid level 4/11 was 115 and likely falsely elevated as it was not a true trough   7. Discontinue cabergoline with last dose being on 4/10 as the benefits of discontinuing at this time exceed the risks in the setting of acute psychosis   8. Continue other medications as currently prescribed   9. Updated patients father on 4/14  10. Referral to medical legal for pursuing guardianship in the future   11. Records requested from [place] admission         REASONS FOR CONTINUED HOSPITALIZATION  Psychiatric stabilization and medication management.Patient has continued to display agitation, ongoing psychosis and delusions are interfering with her ability to maintain safety. Patient is a medically complex case and symptoms have been resistant to multiple medication trials. With ongoing delusions she has the potential to harm herself or others if discharged at this time.      ______________________________________________________________  SUBJECTIVE  Kristin Maynard is seen in the consult room today.  She reports that she did not sleep well last night and is unsure why.  She did see that she continued to have nightmares last night.  She reports her appetite is good.  She endorses HI towards a dark female who is imaginary. She reports getting upset last night because she felt like she needed to choose between her father or the unicorn.  She reports that the unicorn is comforting to her.  She denies SI and denies AH at this time.  She notes that auditory hallucinations and visual hallucinations typically are worse at nighttime.  She acknowledges that she was not stable for discharge at this time.    She required as needed Haldol and slept a documented 3 hours.  Additional Valium as needed was not given last night.  She had a difficult visit with her father last night and became agitated. She required the haldol at that time and continued to pace the halls and was pushing on the wall.     REVIEW OF SYSTEMS  Review of Systems   Gastrointestinal: Negative for constipation, diarrhea and vomiting.   Musculoskeletal: Negative for back pain.   Skin: Negative for rash.   Neurological: Negative for dizziness and headaches.   Psychiatric/Behavioral: Positive for hallucinations. Negative for suicidal ideas. The patient does not have insomnia.      ______________________________________________________________  OBJECTIVE  Vital Signs:  Current                Vital Signs: 24 Hour Range   BP: 132/88 (04/14 0800)  Temp: 36.2 ?C (97.1 ?F) (04/14 0800)  Pulse: 99 (04/14 0800)  Respirations: 16 PER MINUTE (04/14 0800)  SpO2: 97 % (04/14 0800) BP: (132-135)/(88-95)   Temp:  [36.2 ?C (97.1 ?F)-36.2 ?C (97.2 ?F)]   Pulse:  [99]   Respirations:  [16 PER MINUTE-18 PER MINUTE]   SpO2:  [97 %-100 %]    Intensity Pain Scale (Self Report): (not recorded)      Scheduled Medications:  diazePAM (VALIUM) tablet 10 mg, 10 mg, Oral, QHS  [START ON 10/28/2021] diazePAM (VALIUM) tablet 5 mg, 5 mg, Oral, QDAY  divalproex (DEPAKOTE ER) ER tablet 1,500 mg, 1,500 mg, Oral, QHS  fluPHENAZine HCL (PROLIXIN) tablet 2.5 mg, 2.5 mg, Oral, QHS  folic acid (FOLVITE) tablet 1 mg, 1 mg, Oral, QDAY  levonorgestrel-ethinyl estradiol (AVIANE-28, LESSINA-28) tablet 1 tablet, 1 tablet, Oral, QDAY  melatonin tablet 5 mg, 5 mg, Oral, QHS  metoprolol tartrate tablet 25 mg, 25 mg, Oral, BID  QUEtiapine (SEROquel) tablet 800 mg, 800 mg, Oral, QHS        PRN Medications:  acetaminophen Q6H PRN 650 mg at 10/26/21 1412, calcium carbonate TID PRN, fluPHENAZine Q6H PRN **OR** fluPHENAZine Q6H PRN, hydrOXYzine Q6H PRN 25 mg at 10/22/21 2238, LORazepam Q6H PRN 2 mg at 10/24/21 0410 **OR** LORazepam  (ATIVAN)  injection Q6H PRN, ondansetron Q6H PRN 4 mg at 10/15/21 1343, polyethylene glycol 3350 QDAY PRN, senna/docusate QDAY PRN    MENTAL STATUS EXAMINATION  General/Constitutional: appears stated age, dressed appropriately, good hygiene   Eye Contact: poor - looking down   Behavior: calm and cooperative   Speech: RRR with normal volume and monotone. Good articulation  Mood: good  Affect: restricted   Thought Process: concrete   Thought Content: denies SI, endorses HI.  Perception: endorses VH, denies AH. While entering room was responding to internal stimuli   Associations: Intact  Insight/Judgment: poor but improving/improving    Physical Exam:  Gait: Ambulates without difficulty. Normal arm swing     ______________________________________________________________  [name], DO

## 2021-10-27 NOTE — Behavioral Health Treatment Team
TREATMENT TEAM NOTE  Name: Chantel Teti          MRN: [id number]              DOB: 1996/06/13          Age: 26 y.o.  Admission Date: 09/23/2021                 Attendees:   Psychiatrist: Arnette Norris, DO  Nurse Supervisor: Cyndia Skeeters, RN, BSN  Nurse: Dede Query, RN  Pharmacist: Norvel Richards, PharmD  Therapist: Dewayne Hatch, [place], [employer]  Therapist: Neita Carp, [place]  Case Manager: Carmela Rima, LMSW  Case Manager: Tinnie Gens, LMSW  Case Manager: April Cordero, [place]  Utilization Review: Areatha Keas, RN    Significant Points Discussed:  Still endorsing AVH  HI towards "the bad people"  PRNs were given, slept 3 hours  Outpatient appointments are scheduled  CM to follow up with dad on how visit went    Projected Discharge Date:  Discharge today

## 2021-10-28 NOTE — Group Note
Name: Kristin Maynard   MRN: [id number]     DOB: [birth year]      Age: 25 y.o.  Admission Date: 09/23/2021     LOS: 35 days     Date of Service: 10/28/2021      Group Topic: BH Coping Skills  Group Date: 10/28/2021  Start Time: 1115  End Time: 1200  Facilitators: Harrell Lark          Number of Participants: 10  Group Focus: communication, concentration, coping skills, leisure skills, music therapy, problem solving, relaxation, reminiscence, and social skills  Treatment Modality: Music Therapy  Interventions utilized were other Life Album  Purpose: enhance coping skills, express feelings, improve communication skills, increase insight, and reinforce self-care    Life Album: The patients were given papers with the mental health play list worksheet and colored pencils. The therapist instructed clients to write down a song within each category of the play list while playing soft accompanying music. The patients then used the back of the play list and the colored pencils to draw their album cover art. The therapist then gave the clients the opportunity to share within the group and listened to their listed songs.      Name: Kristin Maynard Date of Birth: Aug 09, 1995   MR: [id number]      Level of Participation: patient not present for group   Plan: to continue treatment

## 2021-10-28 NOTE — Care Plan
Problem: Harm to self, high risk of suicide  Goal: Absence of Harm to Self  Outcome: Goal Ongoing  Flowsheets (Taken 10/27/2021 2319)  Absence of harm to self:   Assess and report significant changes in mood and affect   Complete the environmental risk assessment   Complete safety plan   Maintain a safe environment   Reassess for suicide risk daily     Problem: Mood - Altered  Goal: Stabilize mood  Outcome: Goal Ongoing  Flowsheets (Taken 10/27/2021 2319)  Stabilize mood:   Assess coping style   Assess depression history   Assess depressive symptoms   Assess ineffective coping signs and symptoms     Problem: Health Maintenance - Impaired  Goal: Able to perfom ADL's  Outcome: Goal Ongoing  Flowsheets (Taken 10/27/2021 2319)  Able to perform ADLs:   Assess ADL ability   Assess for pain   Assess sleep/rest   Assess nutritional intake

## 2021-10-28 NOTE — Group Note
Name: Kristin Maynard   MRN: [id number]     DOB: [birth year]      Age: 26 y.o.  Admission Date: 09/23/2021     LOS: 35 days     Date of Service: 10/28/2021      Group Topic: BH Goal Setting  Group Date: 10/28/2021  Start Time: 1300  End Time: 1400  Facilitators: Michelene Heady          Number of Participants: 10  Group Focus: goals/reality orientation, personal responsibility, and problem solving  Treatment Modality: Group Psychotherapy  Interventions utilized were clarification, exploration, group exercise, and story telling  Purpose: enhance coping skills, increase insight, and regain self-worth        Group members reviewed handout on Motivation: What is Motivation? Types of Motivation: Extrinsic and Intrinsic Motivation and Why Motivation is Important.  Each group member shared what motivation meant to them, shared about a time when they did something as a result of extrinsic and/ or intrinsic motivation; Components of Motivation; Tips for Improving Motivation;  and Causes of Low Motivation. Group members also reflected on a motivational quote that they identified with.            Name: Kristin Maynard Date of Birth: 08/31/1995   MR: [id number]      Level of Participation: active   Quality of Participation: attentive and engaged  Interactions with others: gave feedback  Mood/Affect: appropriate  Cognition: coherent/clear  Progress: Moderate  Response: Pt participated in group activities and gave feedback  Plan: to continue treatment

## 2021-10-28 NOTE — Progress Notes
Pt pulse was elevated when vital signs where taken 121. Gave metoprolol that pt had ordered and rechecked pulse after one hour pulse rate 101.

## 2021-10-28 NOTE — Progress Notes
Psychiatry Progress Note  Name: Kristin Maynard          MRN: [id number] DOB: 26-Dec-1995         Age: 26 y.o.  Admission Date: 09/23/2021  LOS: 35 days    Service: [facility] Adult Psych A    Principal Problem:    Schizophrenia (HCC)  Active Problems:    Pituitary macroadenoma (HCC)    Hyperprolactinemia (HCC)    Elevated BP without diagnosis of hypertension    Sinus tachycardia    Leukocytosis    PLAN  1. No medication changes made 10/28/2021    2. Continued admission to [facility]  3. Continue Valium 5mg  PO Daily and 10mg  PO QHS  4. Continue Prolixin 2.5mg  PO QHS and 1.25mg  PO Q6H PRN  5. Continue Depakote ER 1500mg  PO QHS  6. Continue Folate 1mg  PO Daily  7. Continue Seroquel 800mg  PO QHS       REASONS FOR CONTINUED HOSPITALIZATION  Psychiatric stabilization and medication management. Patient with decreased social support and at risk for recidivism if discharged at this time.    ______________________________________________________________  SUBJECTIVE  Overnight Events: NAEO  Pt seen in her room this morning and reports that her sleep is pretty good her appetite is good.  She denies any depression reports her anxiety is a 3/10 with 10 being the worst.  She reports her mood is pretty good denies any SI/HI or VH.  She does endorse muffled auditory hallucinations but cannot make out what the voices are saying.Marland Kitchen    REVIEW OF SYSTEMS  Review of Systems   Constitutional: Negative for appetite change, chills, fatigue, fever and unexpected weight change.   HENT: Negative for congestion, rhinorrhea and sore throat.    Respiratory: Negative for shortness of breath.    Cardiovascular: Negative for chest pain and palpitations.   Gastrointestinal: Negative for abdominal pain, constipation, diarrhea, nausea and vomiting.   Musculoskeletal: Negative for arthralgias and myalgias.   Skin: Negative for rash.   Neurological: Negative for dizziness, light-headedness and numbness.   Psychiatric/Behavioral: Positive for hallucinations. Negative for confusion, decreased concentration, dysphoric mood, self-injury, sleep disturbance and suicidal ideas. The patient is nervous/anxious.      ______________________________________________________________  OBJECTIVE                 Vital Signs:  Current                Vital Signs: 24 Hour Range   BP: 131/59 (04/15 0800)  Temp: 36.1 ?C (97 ?F) (04/15 0800)  Pulse: 98 (04/15 0800)  Respirations: 16 PER MINUTE (04/15 0800)  SpO2: 97 % (04/15 0800) BP: (127-131)/(59-84)   Temp:  [36.1 ?C (97 ?F)-36.6 ?C (97.9 ?F)]   Pulse:  [98-121]   Respirations:  [16 PER MINUTE-18 PER MINUTE]   SpO2:  [97 %]    Intensity Pain Scale (Self Report): (not recorded)      Scheduled Medications:  diazePAM (VALIUM) tablet 10 mg, 10 mg, Oral, QHS  diazePAM (VALIUM) tablet 5 mg, 5 mg, Oral, QDAY  divalproex (DEPAKOTE ER) ER tablet 1,500 mg, 1,500 mg, Oral, QHS  fluPHENAZine HCL (PROLIXIN) tablet 2.5 mg, 2.5 mg, Oral, QHS  folic acid (FOLVITE) tablet 1 mg, 1 mg, Oral, QDAY  levonorgestrel-ethinyl estradiol (AVIANE-28, LESSINA-28) tablet 1 tablet, 1 tablet, Oral, QDAY  melatonin tablet 5 mg, 5 mg, Oral, QHS  metoprolol tartrate tablet 25 mg, 25 mg, Oral, BID  QUEtiapine (SEROquel) tablet 800 mg, 800 mg, Oral, QHS  PRN Medications:  acetaminophen Q6H PRN 650 mg at 10/26/21 1412, calcium carbonate TID PRN, fluPHENAZine Q6H PRN **OR** fluPHENAZine Q6H PRN, hydrOXYzine Q6H PRN 25 mg at 10/22/21 2238, LORazepam Q6H PRN 2 mg at 10/24/21 0410 **OR** LORazepam  (ATIVAN)  injection Q6H PRN, ondansetron Q6H PRN 4 mg at 10/15/21 1343, polyethylene glycol 3350 QDAY PRN, senna/docusate QDAY PRN    Mental Status Exam:  Mental Status Evaluation:    General/Constitutional: Appears stated age, in hospital attire, good hygiene and grooming.   Eye Contact: Fair  Behavior: Appropriate and cooperative  Speech: regular rate and rhythm, normal tone and volume.   Mood: Pretty good  Affect: Content, mood congruent  Thought Process: Linear, organized, easy to follow and understand.  Thought Content: Denies SI/HI. No evidence of paranoia, delusions, or illusions.   Perception: Endorses AH.  Denies VH  Associations: Intact  Insight/Judgment: Limited/limited    Focused Physical Exam:  Physical Exam  Pulmonary:      Effort: Pulmonary effort is normal. No respiratory distress.   Musculoskeletal:      Right lower leg: No edema.      Left lower leg: No edema.   Neurological:      Mental Status: She is alert.      Motor: No atrophy.      Gait: Gait normal.          ______________________________________________________________  Rae Mar, MD

## 2021-10-28 NOTE — Group Note
Name: Karesa Maultsby   MRN: [id number]     DOB: 1995/11/22      Age: 26 y.o.  Admission Date: 09/23/2021     LOS: 35 days     Date of Service: 10/28/2021      Group Topic: BH Emotion Regulation (DBT Skill)  Group Date: 10/28/2021  Start Time: 9937  End Time: 1115  Facilitators: Daiva Nakayama, [place]          Number of Participants: 11  Group Focus: coping skills, feeling awareness/expression, goals/reality orientation, and self-awareness  Treatment Modality: Dialectical Behavioral Therapy  Interventions utilized were assignment and exploration  Purpose: enhance coping skills, express feelings, increase insight, regain self-worth, and relapse prevention strategies          Name: Ciela Date of Birth: 03/07/1996   MR: [id number]      Level of Participation: patient not present for group     Plan: to continue treatment

## 2021-10-28 NOTE — Progress Notes
PRN Medication Administered:  Ativan '2mg'$   Non pharmacological interventions attempted prior to PRN medication administration:  Verbal de-escalation techniques and Decrease stimuli    Brief narrative of reason for PRN medication administration: Pt becoming agitated with internal stimuli.

## 2021-10-28 NOTE — Care Plan
Problem: Coping - Ineffective, Family  Goal: Effective Coping  Outcome: Goal Ongoing     Problem: Coping - Ineffective, Family  Goal: Communicate with support staff  Outcome: Goal Ongoing     Problem: Cognitive-Perceptual Pattern - Impaired  Goal: Knowledge of prescribed medication  Outcome: Goal Ongoing

## 2021-10-29 MED ORDER — FLUPHENAZINE HCL 5 MG PO TAB
5 mg | ORAL | 0 refills | Status: DC | PRN
Start: 2021-10-29 — End: 2021-11-02

## 2021-10-29 MED ORDER — FLUPHENAZINE HCL 2.5 MG/ML IJ SOLN
1.25 mg | INTRAMUSCULAR | 0 refills | Status: DC | PRN
Start: 2021-10-29 — End: 2021-11-02

## 2021-10-29 NOTE — Group Note
Name: Janyth Riera   MRN: [id number]     DOB: 08/12/1995      Age: 26 y.o.  Admission Date: 09/23/2021     LOS: 36 days     Date of Service: 10/29/2021      Group Topic: [place] Goal Setting  Group Date: 10/29/2021  Start Time: 1500  End Time: 1600  Facilitators: Michelene Heady          Number of Participants: 6  Group Focus: concentration, goals/reality orientation, personal responsibility, and problem solving  Treatment Modality: Group Psychotherapy  Interventions utilized were clarification, group exercise, and patient education  Purpose: enhance coping skills and increase insight        Group members reviewed handout on Motivation vs Determination: learning about the similarities and differences as well as the role each play in accomplishing goals. Group member were given a handout with bullet points to complete reflecting their individual determinations:    * I am determined to       * I am determined I will    * I am determined no one will         Name: Lilyth Date of Birth: 1995-11-28   MR: [id number]      Level of Participation: patient not present for group   Plan: to continue treatment

## 2021-10-29 NOTE — Group Note
Name: Meeya Goldin   MRN: [id number]     DOB: 07-17-1995      Age: 26 y.o.  Admission Date: 09/23/2021     LOS: 36 days     Date of Service: 10/29/2021      Group Topic: [place] Goal Setting  Group Date: 10/29/2021  Start Time: 1100  End Time: 1200  Facilitators: Michelene Heady          Number of Participants: 8  Group Focus: concentration, coping skills, problem solving, self-awareness, and self-esteem  Treatment Modality: Group Psychotherapy  Interventions utilized were exploration, group exercise, and problem solving  Purpose: enhance coping skills and increase insight      Group members reviewed and reflected on handout on 'Determination': Why Determination is Important; the Benefits of Determination. They also reflected on inspirational quotes on determination.        Name: Kirbie Date of Birth: Jun 23, 1996   MR: [id number]      Level of Participation: patient not present for group   Plan: to continue treatment

## 2021-10-29 NOTE — Progress Notes
PRN Medication Administered:  Atarax '25mg'$   Non pharmacological interventions attempted prior to PRN medication administration:  Decrease stimuli    Brief narrative of reason for PRN medication administration: Pt is increasingly getting anxious and responding to internal stimuli.

## 2021-10-29 NOTE — Care Plan
Problem: Mood - Altered  Goal: Knowledge of Altered Mood  Outcome: Goal Ongoing     Problem: Self-esteem - Low  Goal: Demonstration of positive self-esteem  Outcome: Goal Ongoing     Problem: Violence, self/other-directed, Risk of  Goal: Absence of violence  Outcome: Goal Ongoing

## 2021-10-29 NOTE — Progress Notes
Psychiatry Progress Note  Name: Kristin Maynard          MRN: [id number] DOB: December 16, 1995         Age: 26 y.o.  Admission Date: 09/23/2021  LOS: 36 days    Service: [facility] Adult Psych A    Principal Problem:    Schizophrenia (HCC)  Active Problems:    Pituitary macroadenoma (HCC)    Hyperprolactinemia (HCC)    Elevated BP without diagnosis of hypertension    Sinus tachycardia    Leukocytosis    PLAN  1. Continued admission to [facility]  2. Continue Valium 5mg  PO Daily and 10mg  PO QHS  3. Increase Prolixin to 5mg  PO QHS   4. Continue 1.25mg  PO Q6H PRN  5. Continue Depakote ER 1500mg  PO QHS  6. Continue Folate 1mg  PO Daily  7. Continue Seroquel 800mg  PO QHS       REASONS FOR CONTINUED HOSPITALIZATION  Psychiatric stabilization and medication management. Patient with decreased social support and at risk for recidivism if discharged at this time.    ______________________________________________________________  SUBJECTIVE  Overnight Events: Patient given Ativan 2 mg p.o. x1 for agitation and anxiety  Pt seen in the hallway outside of her room this morning and reports that she slept pretty good that her appetite is good she continues to report anxiety that is a 2/10 with 10 being the worst and denies any depression.  She reports auditory hallucinations that are muffled voices denies any visual hallucinations denies any paranoia.  However patient was noted to be wandering around the unit talking to herself and appeared to be religiously preoccupied.Marland Kitchen    REVIEW OF SYSTEMS  Review of Systems   Constitutional: Negative for appetite change, chills, fatigue, fever and unexpected weight change.   HENT: Negative for congestion, rhinorrhea and sore throat.    Respiratory: Negative for shortness of breath.    Cardiovascular: Negative for chest pain and palpitations.   Gastrointestinal: Negative for abdominal pain, constipation, diarrhea, nausea and vomiting.   Musculoskeletal: Negative for arthralgias and myalgias.   Skin: Negative for rash.   Neurological: Negative for dizziness, light-headedness and numbness.   Psychiatric/Behavioral: Positive for hallucinations. Negative for confusion, decreased concentration, dysphoric mood, self-injury, sleep disturbance and suicidal ideas. The patient is nervous/anxious.      ______________________________________________________________  OBJECTIVE                 Vital Signs:  Current                Vital Signs: 24 Hour Range   BP: 141/96 (04/16 0800)  Temp: 36.7 ?C (98 ?F) (04/16 0800)  Pulse: 101 (04/16 0800)  Respirations: 16 PER MINUTE (04/16 0800)  SpO2: 100 % (04/16 0800) BP: (138-141)/(76-96)   Temp:  [36.1 ?C (97 ?F)-36.7 ?C (98 ?F)]   Pulse:  [101-110]   Respirations:  [16 PER MINUTE-20 PER MINUTE]   SpO2:  [98 %-100 %]    Intensity Pain Scale (Self Report): (not recorded)      Scheduled Medications:  diazePAM (VALIUM) tablet 10 mg, 10 mg, Oral, QHS  diazePAM (VALIUM) tablet 5 mg, 5 mg, Oral, QDAY  divalproex (DEPAKOTE ER) ER tablet 1,500 mg, 1,500 mg, Oral, QHS  fluPHENAZine HCL (PROLIXIN) tablet 2.5 mg, 2.5 mg, Oral, QHS  folic acid (FOLVITE) tablet 1 mg, 1 mg, Oral, QDAY  levonorgestrel-ethinyl estradiol (AVIANE-28, LESSINA-28) tablet 1 tablet, 1 tablet, Oral, QDAY  melatonin tablet 5 mg, 5 mg, Oral, QHS  metoprolol tartrate  tablet 25 mg, 25 mg, Oral, BID  QUEtiapine (SEROquel) tablet 800 mg, 800 mg, Oral, QHS        PRN Medications:  acetaminophen Q6H PRN 650 mg at 10/26/21 1412, calcium carbonate TID PRN, fluPHENAZine Q6H PRN **OR** fluPHENAZine Q6H PRN, hydrOXYzine Q6H PRN 25 mg at 10/22/21 2238, LORazepam Q6H PRN 2 mg at 10/28/21 1840 **OR** LORazepam  (ATIVAN)  injection Q6H PRN, ondansetron Q6H PRN 4 mg at 10/15/21 1343, polyethylene glycol 3350 QDAY PRN, senna/docusate QDAY PRN    Mental Status Exam:  Mental Status Evaluation:    General/Constitutional: Appears stated age, in hospital attire, good hygiene and grooming.   Eye Contact: Fair  Behavior: Appropriate and cooperative  Speech: regular rate and rhythm, normal tone and volume.   Mood: Good  Affect: Content, mood congruent  Thought Process: Linear, organized, easy to follow and understand.  Thought Content: Denies SI/HI. No evidence of paranoia, delusions, or illusions.   Perception: Endorses AH.  Denies VH  Associations: Intact  Insight/Judgment: Limited/limited    Focused Physical Exam:  Physical Exam  Pulmonary:      Effort: Pulmonary effort is normal. No respiratory distress.   Musculoskeletal:      Right lower leg: No edema.      Left lower leg: No edema.   Neurological:      Mental Status: She is alert.      Motor: No atrophy.      Gait: Gait normal.          ______________________________________________________________  Rae Mar, MD

## 2021-10-29 NOTE — Group Note
Name: Kristin Maynard   MRN: [id number]     DOB: 08/19/1995      Age: 26 y.o.  Admission Date: 09/23/2021     LOS: 36 days     Date of Service: 10/29/2021      Group Topic: BH Leisure Activities  Group Date: 10/29/2021  Start Time: 0930  End Time: 4076  Facilitators: Earl Lites          Number of Participants: 10  Group Focus: coping skills and leisure skills  Treatment Modality: Recreation Therapy  Interventions utilized were leisure development  Purpose: enhance coping skills, improve communication skills, and reinforce self-care    Patients exercised to music, shared their name and what they are grateful for. Patients were divided into three groups and played team Scaterggories. Patients then participated in Pet therapy, sharing stories about their pets as they pet the therapy dog.       Name: Kristin Maynard Date of Birth: [birth year]   MR: [id number]      Level of Participation: active   Quality of Participation: attentive, cooperative and engaged  Interactions with others: gave feedback  Mood/Affect: appropriate  Cognition: goal directed  Progress: Moderate  Response: Patient was talking to herself but stopped once the exercises started. Patient enjoyed petting the dog and playing the game.  Plan: to continue treatment

## 2021-10-29 NOTE — Care Plan
Problem: Mood - Altered  Goal: Stabilize mood  Outcome: Goal Ongoing  Flowsheets (Taken 10/29/2021 0009)  Stabilize mood:   Assess coping style   Assess depression history   Assess depressive symptoms   Assess ineffective coping signs and symptoms   Establish therapeutic relationships  Goal: Knowledge of Altered Mood  Outcome: Goal Ongoing  Flowsheets (Taken 10/29/2021 0009)  Knowledge of altered mood:   Provide psychosocial intervention education   Provide self-help education   Provide relaxation techniques education     Problem: Thought Process - Altered  Goal: Demonstration of organized thought processes  Outcome: Goal Ongoing  Flowsheets (Taken 10/29/2021 0009)  Demonstration of organized thought processes:   Assess cognitive ability   Assess risk for impaired cognition   Assess disturbed thought process signs and symptoms   Assess thought process   Provide environmental regulation - agitation   Assess in reality orientation

## 2021-10-29 NOTE — Progress Notes
Case Management Progress Note    NAME:Kristin Maynard MRN: [id number] DOB:[birth year] AGE: 26 y.o.  ADMISSION DATE: 09/23/2021 DAYS ADMITTED: LOS: 36 days     Date of Service: 10/29/2021  Service Start Time: 1300     Service End Time:      CM met with patient to discuss progress. Patient reported feeling fine, patient reported what she had for lunch and stated she ate enough and feels fine. Patient reported she no longer wanted to talk due to going to group. Patient walked away and sat in the day room.

## 2021-10-30 MED ORDER — FLUPHENAZINE HCL 5 MG PO TAB
5 mg | Freq: Every evening | ORAL | 0 refills | Status: DC
Start: 2021-10-30 — End: 2021-11-02
  Administered 2021-10-31 – 2021-11-02 (×3): 5 mg via ORAL

## 2021-10-30 NOTE — Discharge Instructions
Case Management Discharge Instructions:  Patient will discharge by car to her home in [place]. Discharge address is [address] [place], [place] [address].    -Follow up with scheduled appointments as recommended.  -Take medications as prescribed  -Follow up with medication management as recommended.  -Utilize individual and group therapy as needed.  -Continue using daily coping skills.   -In case of crisis contact 911 or nearest hospital emergency room.    Appointment Information:  Patient encouraged to follow at the [facility] for outpatient services.     [facility]  [address], [address]  [place], [place] [address]  Phone: [phone]     Patient has therapy appointment on 11/14/21 @ 10:30am with [name]. This will be in-person at the center.    Patient has medication appointment on 11/15/21 @ 9:20am with Chinita Pester. This will be a telehealth appointment. Patient has been sent the Zoom link.     Other Follow Up Information:  [place]: [phone] or you can text [phone].   We can all help prevent suicide. The Lifeline provides 24/7, free and confidential support for people in distress, prevention and crisis resources for you or your loved ones, and best practices for professionals.     CrisisText Line: Text HELLO or HOME to [place] serves anyone, in any type of crisis, providing access to free, 24/7 support and information via a medium people already use and trust: text.    [facility] 24 hours per day, 7 days per week for those needing immediate attention.  After-hours services are available to clients and referral sources for consultation, problem resolution and, when needed, face-to-face intervention. To contact a member of the Emergency Services team:   During the day call: [phone]  After hours call: [phone]

## 2021-10-30 NOTE — Progress Notes
Case Management Progress Note    NAME:Rida Stacee Earp MRN: [id number] DOB:October 08, 1995 AGE: 26 y.o.  ADMISSION DATE: 09/23/2021 DAYS ADMITTED: LOS: 37 days     Date of Service: 10/30/2021  Service Start Time:  10:05am  Service End Time:  10:05am    Student intern attempted to hold CM group. Patient decided to participate in healthy leisure time instead.

## 2021-10-30 NOTE — Progress Notes
Psychiatry Progress Note  Name: Kristin Maynard          MRN: [id number] DOB: 12-31-1995         Age: 26 y.o.  Admission Date: 09/23/2021  LOS: 37 days    Service: [facility] Adult Psych A    Principal Problem:    Schizophrenia (HCC)  Active Problems:    Pituitary macroadenoma (HCC)    Hyperprolactinemia (HCC)    Elevated BP without diagnosis of hypertension    Sinus tachycardia    Leukocytosis    PLAN  1. Continued admission to [facility]  2. Continue Valium 5mg  PO Daily and 10mg  PO QHS  3. Increase Prolixin to 5mg  PO QHS   4. Continue Depakote ER 1500mg  PO QHS  5. Continue Folate 1mg  PO Daily  6. Continue Seroquel 800mg  PO QHS       REASONS FOR CONTINUED HOSPITALIZATION  Psychiatric stabilization and medication management.Patient has continued to display agitation, ongoing psychosis and delusions are interfering with her ability to maintain safety. Patient is a medically complex case and symptoms have been resistant to multiple medication trials. With ongoing delusions she has the potential to harm herself or others if discharged at this time.      ______________________________________________________________  Kristin Maynard was seen for follow up today in the consult room. She reports sleeping better last night.  She states that the auditory hallucinations have improved. She denies SI/HI. AH of F you Kristin Maynard. She reports VH of exodio from [place] who is a powerful card and then goes onto describe how the game is played.     She has continued to pace and experience racing thoughts.  She continues to be observed responding to stimuli.  She slept for documented 2 hours last night.  She has not required any as needed medications.        REVIEW OF SYSTEMS  Review of Systems   Constitutional: Negative for appetite change, chills, fatigue and fever.   HENT: Negative for congestion.    Respiratory: Negative for shortness of breath.    Gastrointestinal: Negative for abdominal pain, constipation, diarrhea, nausea and vomiting.   Musculoskeletal: Negative for myalgias.   Skin: Negative for rash.   Neurological: Negative for dizziness, light-headedness and numbness.   Psychiatric/Behavioral: Positive for hallucinations. Negative for confusion, decreased concentration, dysphoric mood, self-injury, sleep disturbance and suicidal ideas. The patient is nervous/anxious.      ______________________________________________________________  OBJECTIVE                 Vital Signs:  Current                Vital Signs: 24 Hour Range   BP: 107/103 (04/17 0800)  Temp: 36.1 ?C (97 ?F) (04/17 0800)  Pulse: 93 (04/17 0800)  Respirations: 18 PER MINUTE (04/17 0800)  SpO2: 100 % (04/17 0800) BP: (107-136)/(89-103)   Temp:  [36.1 ?C (97 ?F)-36.4 ?C (97.6 ?F)]   Pulse:  [93-102]   Respirations:  [18 PER MINUTE]   SpO2:  [99 %-100 %]    Intensity Pain Scale (Self Report): (not recorded)      Scheduled Medications:  diazePAM (VALIUM) tablet 10 mg, 10 mg, Oral, QHS  diazePAM (VALIUM) tablet 5 mg, 5 mg, Oral, QDAY  divalproex (DEPAKOTE ER) ER tablet 1,500 mg, 1,500 mg, Oral, QHS  fluPHENAZine HCL (PROLIXIN) tablet 2.5 mg, 2.5 mg, Oral, QHS  folic acid (FOLVITE) tablet 1 mg, 1 mg, Oral, QDAY  levonorgestrel-ethinyl estradiol (AVIANE-28, LESSINA-28) tablet  1 tablet, 1 tablet, Oral, QDAY  melatonin tablet 5 mg, 5 mg, Oral, QHS  metoprolol tartrate tablet 25 mg, 25 mg, Oral, BID  QUEtiapine (SEROquel) tablet 800 mg, 800 mg, Oral, QHS        PRN Medications:  acetaminophen Q6H PRN 650 mg at 10/26/21 1412, calcium carbonate TID PRN, fluPHENAZine Q6H PRN **OR** fluPHENAZine Q6H PRN, hydrOXYzine Q6H PRN 25 mg at 10/29/21 1411, LORazepam Q6H PRN 2 mg at 10/28/21 1840 **OR** LORazepam  (ATIVAN)  injection Q6H PRN, ondansetron Q6H PRN 4 mg at 10/15/21 1343, polyethylene glycol 3350 QDAY PRN, senna/docusate QDAY PRN    MENTAL STATUS EXAMINATION  General/Constitutional: appears stated age, dressed appropriately, good hygiene   Eye Contact: good  Behavior: Calm, cooperative; appropriate for conversation  Speech: RRR with normal volume and tone. Good articulation  Mood: better  Affect: brightened; mood congruent  Thought Process: circumstantial  Thought Content: denies SI, HI. Continued evidence of delusions   Perception: Endorses AH of voices, endorses VH of [place]  Associations: Intact  Insight/Judgment: improving/improving     Physical Exam:  Gait: Ambulates without difficulty. Normal arm swing   ______________________________________________________________  [name], DO

## 2021-10-30 NOTE — Care Plan
Problem: Mood - Altered  Goal: Stabilize mood  Outcome: Goal Ongoing  Flowsheets (Taken 10/29/2021 2135)  Stabilize mood:   Assess depression history   Assess depressive symptoms   Assess coping style   Assess ineffective coping signs and symptoms   Establish therapeutic relationships     Problem: Health Maintenance - Impaired  Goal: Able to perfom ADL's  Outcome: Goal Ongoing  Flowsheets (Taken 10/29/2021 2135)  Able to perform ADLs:   Assess sleep/rest   Assess for pain   Assess nutritional intake   Assess fluid volume   Assess ADL ability     Problem: Coping - Ineffective, Family  Goal: Knowledge of positive coping methods  Outcome: Goal Ongoing  Flowsheets (Taken 10/29/2021 2135)  Knowledge of positive coping methods:   Provide disease process education   Provide positive coping methods education

## 2021-10-30 NOTE — Progress Notes
Psychotherapy Progress Note    NAME:Kristin Maynard MRN: [id number] DOB:08-14-1995 AGE: 26 y.o.  ADMISSION DATE: 09/23/2021 DAYS ADMITTED: LOS: 37 days    Date of Service:  10/30/21    Principal Problem:    Schizophrenia ([place])  Active Problems:    Pituitary macroadenoma ([place])    Hyperprolactinemia (HCC)    Elevated BP without diagnosis of hypertension    Sinus tachycardia    Leukocytosis    Objective:  Kristin Maynard will report a decrease in psychoses at least 24 hours prior to discharge.    Narrative:  SW met with patient for individual session to assess patient's progress. Patient reports her sleep is "okay" but states she is having trouble staying asleep during the night. SW encouraged patient to discuss this with the psychiatrist. Patient states she is still talking to "the good voices" and uses them as a way to "fight the bad voices". Patient states at her baseline she hears "a little" of the voices but they are mostly good. Patient continues to report her symptoms worsen at night. SW and patient processed further. SW and patient discussed her most recent visit with her dad. Per patient, "It could have gone better". SW and patient discussed further. SW informed patient that she is scheduled for therapy and psychiatry at the [facility] in May. Patient verbalized understanding. SW ensured any additional questions/concerns were addressed prior to ending session. Patient shows some progress towards treatment goals.     Follow up:  Will continue to meet with patient to discuss progress and work on [place].

## 2021-10-30 NOTE — Group Note
Name: Kristin Maynard   MRN: [id number]     DOB: 11/26/1995      Age: 26 y.o.  Admission Date: 09/23/2021     LOS: 37 days     Date of Service: 10/30/2021      Group Topic: BH Personal Values  Group Date: 10/30/2021  Start Time: 1100  End Time: 1150  Facilitators: Kary Kos          Number of Participants: 10  Group Focus: coping skills, leisure skills, and problem solving  Treatment Modality: Psychoeducation  Interventions utilized were exploration, leisure development, and patient education  Purpose: enhance coping skills, increase insight, and reinforce self-care          Name: Kristin Maynard Date of Birth: 04-23-1996   MR: [id number]      Level of Participation: minimal   Quality of Participation: distracting to others and withdrawn  Interactions with others: did not interact  Mood/Affect: agitated and blunted  Triggers (if applicable): none observed  Cognition: not focused  Progress: Minimal  Response: Patient did not actively participate in group, and left group for an extended period of time. She was observed responding to internal stimuli (e.g., verbalizing to herself) frequently throughout group.   Plan: to continue treatment

## 2021-10-30 NOTE — Care Plan
Problem: Mood - Altered  Goal: Stabilize mood  Outcome: Goal Ongoing     Problem: Mood - Altered  Goal: Knowledge of Altered Mood  Outcome: Goal Ongoing     Problem: Violence, self/other-directed, Risk of  Goal: Knowledge of risk for violence, self/other-directed  Outcome: Goal Ongoing

## 2021-10-30 NOTE — Care Coordination-Inpatient
Care Coordination - Inpatient Note      Patient Name:   Kristin Maynard                        MRN:  [id number]  Admission Date:  09/23/2021    12:05-12:08pm  [facility] [phone])to reschedule patient's aftercare services. Patient will now have a therapy appointment on 11/14/21 @ 10:30am with [name]. This will be in-person at the [place] office. CM to inform patient and her parents and will follow-up as needed.     Carmela Rima  10/30/2021

## 2021-10-30 NOTE — Behavioral Health Treatment Team
TREATMENT TEAM NOTE  Name: Kristin Maynard          MRN: [id number]              DOB: [birth year]          Age: 26 y.o.  Admission Date: 09/23/2021                 Attendees:   Psychiatrist: Arnette Norris, DO  Psychiatrist: Alfredia Ferguson, APRN  Nurse Supervisor: Cyndia Skeeters, RN, BSN  Nurse: Benay Spice, RN  Pharmacist: Dahlia Client, PharmD  Therapist: Dewayne Hatch, [place], [place]  Therapist: Neita Carp, [place]  Case Manager: Carmela Rima, LMSW  Case Manager: Tinnie Gens, LMSW  Utilization Review: Areatha Keas, RN    Significant Points Discussed:   Pacing, AVH, responding to internal stimuli  Racing thoughts  Slept 2 hours, no PRNs    Projected Discharge Date:  Discharge Thurs

## 2021-10-30 NOTE — Group Note
Name: Aaniyah Strohm   MRN: [id number]     DOB: July 28, 1995      Age: 26 y.o.  Admission Date: 09/23/2021     LOS: 37 days     Date of Service: 10/30/2021      Group Topic: BH Relaxation and Self-Soothing  Group Date: 10/30/2021  Start Time: 0915  End Time: 1000  Facilitators: Earl Lites          Number of Participants: 11  Group Focus: coping skills, leisure skills, relaxation, and self-awareness  Treatment Modality: Recreation Therapy  Interventions utilized were leisure development and mental fitness  Purpose: enhance coping skills, increase insight, regain self-worth, and reinforce self-care    Patients participated in cardio exercise to music. Patients introduced them selves and said something that was going well for them today. Patients ended the group with meditation.       Name: Cerena Date of Birth: 1995/12/02   MR: [id number]      Level of Participation: active   Quality of Participation: attentive and cooperative  Interactions with others: gave feedback  Mood/Affect: appropriate  Cognition: goal directed  Progress: Moderate  Response: When group started, patient participated fully, she was attentive and gave good feedback and did not talk to herself.  Before group started she was talking to herself loudly.   Plan: to continue treatment

## 2021-10-31 NOTE — Group Note
Name: Briauna Gilmartin   MRN: [id number]     DOB: 1996-02-28      Age: 26 y.o.  Admission Date: 09/23/2021     LOS: 38 days     Date of Service: 10/31/2021      Group Topic: [place]ly Goal Setting  Group Date: 10/31/2021  Start Time: 1100  End Time: 1200  Facilitators: Michelene Heady          Number of Participants: 7  Group Focus: coping skills, goals/reality orientation, personal responsibility, and problem solving  Treatment Modality: Group Psychotherapy  Interventions utilized were exploration, group exercise, and patient education  Purpose: enhance coping skills, explore maladaptive thinking, and increase insight      Group members reflected and discussed handout: "What is Mindset and Why it Matters" focusing on Fixed vs Growth Mindset examples and determining which mindset group members identified with. Group members also reviewed how to unfix a fixed mindset. Group members also reflected on quotes on mindset.      Name: Shaianne Date of Birth: July 28, 1995   MR: [id number]      Level of Participation: patient not present for group   Plan: to continue treatment

## 2021-10-31 NOTE — Progress Notes
Psychiatry Progress Note  Name: Kristin Maynard          MRN: [id number] DOB: March 18, 1996         Age: 26 y.o.  Admission Date: 09/23/2021  LOS: 38 days    Service: [facility] Adult Psych A    Principal Problem:    Schizophrenia (HCC)  Active Problems:    Pituitary macroadenoma (HCC)    Hyperprolactinemia (HCC)    Elevated BP without diagnosis of hypertension    Sinus tachycardia    Leukocytosis    PLAN  1. Continued admission to [facility]  2. No medication changes made on 4/18  3. Continue Valium 5mg  PO Daily and 10mg  PO QHS  4. Continue Prolixin 5mg  PO QHS (increased on 4/17)  5. Continue Depakote ER 1500mg  PO QHS  6. Continue Folate 1mg  PO Daily  7. Continue Seroquel 800mg  PO QHS       REASONS FOR CONTINUED HOSPITALIZATION  Psychiatric stabilization and medication management.Patient has continued to display agitation, ongoing psychosis and delusions are interfering with her ability to maintain safety. Patient is a medically complex case and symptoms have been resistant to multiple medication trials. With ongoing delusions she has the potential to harm herself or others if discharged at this time.      ______________________________________________________________  Kristin Maynard was seen for follow up today in the consult room. She reports having a good night last night with few voices  And no nightmares. She reports her mood is a 8/10 and she is feeling more ready for discharge. She denies SI/HI/AVH.     She continues to be observed responding to stimuli at times but has not had any recent outbursts. She has not required PRNs in the last 24 hrs. She slept a documented 4.75 hrs.         REVIEW OF SYSTEMS  Review of Systems   Constitutional: Negative for appetite change, chills, fatigue and fever.   HENT: Negative for congestion.    Respiratory: Negative for shortness of breath.    Gastrointestinal: Negative for abdominal pain, constipation, diarrhea, nausea and vomiting.   Musculoskeletal: Negative for myalgias. Skin: Negative for rash.   Neurological: Negative for dizziness.   Psychiatric/Behavioral: Negative for confusion, decreased concentration, dysphoric mood, self-injury, sleep disturbance and suicidal ideas.     ______________________________________________________________  OBJECTIVE                 Vital Signs:  Current                Vital Signs: 24 Hour Range   BP: 137/96 (04/18 0800)  Temp: 36.2 ?C (97.1 ?F) (04/18 0800)  Pulse: 100 (04/18 0800)  Respirations: 18 PER MINUTE (04/18 0800)  SpO2: 100 % (04/18 0800) BP: (135-137)/(80-96)   Temp:  [36.2 ?C (97.1 ?F)-36.2 ?C (97.2 ?F)]   Pulse:  [100-104]   Respirations:  [16 PER MINUTE-18 PER MINUTE]   SpO2:  [100 %]    Intensity Pain Scale (Self Report): (not recorded)      Scheduled Medications:  diazePAM (VALIUM) tablet 10 mg, 10 mg, Oral, QHS  diazePAM (VALIUM) tablet 5 mg, 5 mg, Oral, QDAY  divalproex (DEPAKOTE ER) ER tablet 1,500 mg, 1,500 mg, Oral, QHS  fluPHENAZine HCL (PROLIXIN) tablet 5 mg, 5 mg, Oral, QHS  folic acid (FOLVITE) tablet 1 mg, 1 mg, Oral, QDAY  levonorgestrel-ethinyl estradiol (AVIANE-28, LESSINA-28) tablet 1 tablet, 1 tablet, Oral, QDAY  melatonin tablet 5 mg, 5 mg, Oral, QHS  metoprolol  tartrate tablet 25 mg, 25 mg, Oral, BID  QUEtiapine (SEROquel) tablet 800 mg, 800 mg, Oral, QHS        PRN Medications:  acetaminophen Q6H PRN 650 mg at 10/26/21 1412, calcium carbonate TID PRN, fluPHENAZine Q6H PRN **OR** fluPHENAZine Q6H PRN, hydrOXYzine Q6H PRN 25 mg at 10/29/21 1411, LORazepam Q6H PRN 2 mg at 10/28/21 1840 **OR** LORazepam  (ATIVAN)  injection Q6H PRN, ondansetron Q6H PRN 4 mg at 10/15/21 1343, polyethylene glycol 3350 QDAY PRN, senna/docusate QDAY PRN    MENTAL STATUS EXAMINATION  General/Constitutional: appears stated age, dressed appropriately, good hygiene   Eye Contact: good  Behavior: Calm, cooperative; appropriate for conversation  Speech: RRR with normal volume and tone. Good articulation  Mood: good  Affect: appropriate to conversation; mood congruent  Thought Process: mostly linear   Thought Content: denies SI, HI. No overt delusions noted   Perception: Denies AVH  Associations: Intact  Insight/Judgment: improving/improving     Physical Exam:  Gait: Ambulates without difficulty. Normal arm swing   ______________________________________________________________  [name], DO

## 2021-10-31 NOTE — Group Note
Name: Kristin Maynard   MRN: [id number]     DOB: Oct 06, 1995      Age: 26 y.o.  Admission Date: 09/23/2021     LOS: 38 days     Date of Service: 10/31/2021      Group Topic: BH Self-Compassion  Group Date: 10/31/2021  Start Time: 0915  End Time: 1000  Facilitators: Garner Nash          Number of Participants: 9  Group Focus: communication, coping skills, feeling awareness/expression, goals/reality orientation, healthy friendships, leisure skills, music therapy, problem solving, relaxation, reminiscence, self-esteem, and social skills  Treatment Modality: Music Therapy  Interventions utilized were other [employer]; About Me; Important to Me  Purpose: enhance coping skills, express feelings, improve communication skills, increase insight, regain self-worth, and reinforce self-care    Lyric Analysis - This is Me: The therapist facilitated a lyric analysis of the song, This is Me. The patient were provided lyrics and encouraged to mark things that stood out, they connected with, any feelings, and themes within the song. The group then discussed these things, having self-confidence, and being unique. The group then transitioned to About Me and Important to Me worksheets  About Me: The therapist provided patients with ?About Me? fill in the blank worksheet while quiet music played in the background. This worksheet had patients complete prompts about their own lives, choices, a life theme song, and reflect on action steps.  Important to Me: The therapist provided patients with the Important to Me worksheet. Within the blank heart, patients were encouraged to write positive words that describe themselves, things or people that are important to them, or draw these things instead. The group the discussed what they had written and discussed how this impacted their sense of identity.  The purposes of these interventions were to enhance coping skills, increase insight, increase self-worth, enhance social skills, increase communication, provide goals/reality orientation and provide validation.        Name: Kristin Maynard Date of Birth: 1996-02-09   MR: [id number]      Level of Participation: active   Quality of Participation: attentive, cooperative and engaged  Interactions with others: gave feedback and pulled from group for clinical purposes  Mood/Affect: flat  Cognition: goal directed  Progress: Moderate  Response: The patient arrived late and was provided materials to actively participate within group. The patient was engaged with group materials and actively interacted during group discussions. During the group, the patient was pulled for clinical purposes and returned to continue previous levels of engagement.  Plan: to continue treatment

## 2021-10-31 NOTE — Group Note
Name: Kristin Maynard   MRN: [id number]     DOB: 06-16-1996      Age: 26 y.o.  Admission Date: 09/23/2021     LOS: 38 days     Date of Service: 10/31/2021      Group Topic: BH Coping Skills  Group Date: 10/31/2021  Start Time: 1015  End Time: 1100  Facilitators: Candie Chroman          Number of Participants: 10  Group Focus: Healthy Activities  Treatment Modality: Art Therapy  Interventions utilized were assignment and exploration  Purpose: enhance coping skills    Group members were encouraged to draw pictures representing healthy activities they enjoy. The purpose of the activity is to provide opportunity for creative expression and reinforce positive coping skills.          Name: Kristin Maynard Date of Birth: April 25, 1996   MR: [id number]      Level of Participation: moderate   Quality of Participation: cooperative and drowsy  Interactions with others: gave feedback  Mood/Affect: flat  Triggers (if applicable): NA   Cognition: logical  Progress: Moderate  Response: Patient was observed on several occasions to be falling asleep while sitting upright trying to draw.  Plan: to continue treatment

## 2021-10-31 NOTE — Progress Notes
Psychotherapy Progress Note    NAME:Kristin Maynard MRN: [id number] DOB:1995-09-03 AGE: 26 y.o.  ADMISSION DATE: 09/23/2021 DAYS ADMITTED: LOS: 38 days    Date of Service:  10/31/21    Principal Problem:    Schizophrenia (HCC)  Active Problems:    Pituitary macroadenoma (HCC)    Hyperprolactinemia (HCC)    Elevated BP without diagnosis of hypertension    Sinus tachycardia    Leukocytosis    Objective:  Kristin Maynard will identify at least two symptoms caused by mental illness and include in safety plan. Kristin Maynard will report a decrease in psychoses at least 24 hours prior to discharge.    Narrative:  SW met with patient in consult room to assess patient's progress and complete safety plan. Patient states she is feeling much better, that the voices are minimal, and feels she would be ready for discharge on Thursday. SW reviewed patient's aftercare services at the [facility] and ensured any questions related to discharge were addressed. SW and patient completed safety plan. Patient shows improved insight into triggers and warning signs. Patient continues to endorse some delusional thoughts but they appear to be coincide with patient's baseline behaviors. Patient states sometimes her voices can be triggering, but mostly reports feeling scared at night and in the dark, as that is when the dark forces appear. SW and patient processed further. SW and patient discussed patient's symptoms when she isn't properly medicated. Patient reports she kind of remembers when she was screaming at the voices and when she was thinking she was God and had to save the world. Patient states she has had to sacrifice something the past two nights so she could sleep well. SW and patient processed further. Patient states she slept without her blanket as a way to avoid the dark forces. Patient states she has to sacrifice something that would make her sleep more comfortable (pillow, blanket, etc.) in order to sleep through the night with no nightmares. Patient also discussed writing down rules in her journal for her voices to read and listen to, as if she is manifesting good behaviors from the negative voices. Patient then asked if this is weird. SW encouraged patient to engage in coping skills that she feels is helpful and allows her to manage her symptoms, but to also report any concerning thoughts or behaviors to her family to maintain open communication. Patient verbalized understanding. SW and patient discussed additional coping skills patient can use when in distress. Patient reports talking to the good voices helps combat the bad voices. SW again reminded her to inform her family about this, as they may express concern. Patient states is it most helpful for her to write down these rules in the journal and asked for one journal upon discharge. SW reiterated to patient the importance of her taking medications as prescribed and engaging in outpatient services. Patient verbalized understanding and shows significant progress towards her treatment goals. SW ensured any additional questions/concerns were addressed prior to ending session.     Follow up:  Will follow-up as needed.

## 2021-10-31 NOTE — Care Plan
Problem: High Fall Risk  Goal: High Fall Risk  Outcome: Goal Ongoing     Problem: Mood - Altered  Goal: Stabilize mood  Outcome: Goal Ongoing     Problem: Mood - Altered  Goal: Knowledge of Altered Mood  Outcome: Goal Ongoing

## 2021-10-31 NOTE — Care Coordination-Inpatient
Care Coordination - Inpatient Note      Patient Name:   Kristin Maynard                        MRN:  [id number]  Admission Date:  09/23/2021    12:38-12:44pm  CM spoke with patient's dad/DPOA,Dennis Corinna Capra [phone]), to discuss tentative discharge on Thursday. CM and [name] discussed patient's baseline, as patient reports she still responds to internal stimuli when at baseline. [name] states this is accurate and also reports the patient "sounds tired". CM informed [name] that patient reported having trouble staying asleep. [name] verbalized understanding. CM reviewed with [name] patient's aftercare services at the [facility]. CM and [name] discussed discharge between 1-1:30pm. [name] states he will call an outpatient endocrinologist to coordinate follow-up care. CM ensured any additional questions/concerns were addressed prior to ending phone call. Will follow-up as needed.     Carmela Rima  10/31/2021

## 2021-10-31 NOTE — Care Plan
Problem: Harm to self, high risk of suicide  Goal: Absence of Harm to Self  Outcome: Goal Ongoing  Flowsheets (Taken 10/30/2021 2214)  Absence of harm to self:   Assess and report significant changes in mood and affect   Complete the environmental risk assessment   Maintain a safe environment   Reassess for suicide risk daily     Problem: Mood - Altered  Goal: Knowledge of Altered Mood  Outcome: Goal Ongoing     Problem: Violence, self/other-directed, Risk of  Goal: Absence of violence  Outcome: Goal Ongoing  Flowsheets (Taken 10/30/2021 2214)  Absence of violence:   Suicide assessment   Environmental safety management   Violent behavior risk assessment

## 2021-10-31 NOTE — Behavioral Health Treatment Team
TREATMENT TEAM NOTE  Name: Kristin Maynard          MRN: [id number]              DOB: 11-21-1995          Age: 26 y.o.  Admission Date: 09/23/2021                 Attendees:   Psychiatrist: Arnette Norris, DO  Psychiatrist: Alfredia Ferguson, APRN  Nurse: Dede Query, RN  Pharmacist: Dahlia Client, PharmD  Therapist: Dewayne Hatch, [place], [place]  Therapist: Neita Carp, LMSW  Case Manager: Tinnie Gens, LMSW  Case Manager: April Cordero, [place]  Utilization Review: Areatha Keas, RN      Significant Points Discussed: visibly responds to internal stimuli; delusional thoughts; paces at times; no issues on the unit; slept 4.75 hours       Treatment Plan Discussion: no med changes       Projected Discharge Date: Thursday

## 2021-10-31 NOTE — Group Note
Name: Kristin Maynard   MRN: [id number]     DOB: December 20, 1995      Age: 26 y.o.  Admission Date: 09/23/2021     LOS: 38 days     Date of Service: 10/31/2021      Group Topic: [place] Leisure Activities  Group Date: 10/31/2021  Start Time: 1173  End Time: 1300  Facilitators: Earl Lites          Number of Participants: 11  Group Focus: leisure skills and social skills  Treatment Modality: Recreation Therapy  Interventions utilized were group exercise and leisure development  Purpose: enhance coping skills, improve communication skills, and reinforce self-care    Patients exercised to music. Patients shared their name and what a good day would look like in their life. Patients played an alphabet game, once using leisure skills and the other time using TV and movies.       Name: Kristin Maynard Date of Birth: 09-04-1995   MR: [id number]      Level of Participation: active   Quality of Participation: attentive, cooperative and engaged  Interactions with others: gave feedback, patient was talking to herself before group started but stopped as soon as the exercised started.  Mood/Affect: appropriate  Cognition: goal directed  Progress: Moderate  Response: Patient lead one of the exercises today and did very well. Patient seemed pleased when the other patients complimented her. Patient likes to exercise.  Plan: to continue treatment

## 2021-10-31 NOTE — Other
Safety Plan   Name: Kristin Maynard          MRN: [id number]              DOB: 12/28/1995        Age: 26 y.o.  Admission Date: 09/23/2021             Step 1: WHAT I LOOK FORWARD TO IN MY FUTURE are (future accomplishments): seeing my family; going back to work; getting back to a normal life; maybe going back to church     Step 2: What are my protective factors? (loved ones, pets, things you look forward to, faith, job, supportive people, fear of death)   Medications; family; outpatient services; coping skills; journal    Step 3: What are TRIGGERS that I can avoid that may result in thoughts of harm to myself or others? (situations, people, behaviors, places, substance)  1. Nighttime   2. Sudden loud noises   3. Darkness   4. Lack of medications   5. Unexpected things   6. Sometimes the bad voices     Step 4: What are my internal WARNING SIGNS of distress? (thoughts, images, moods, memories)  1. Fear/scared   2. Getting a gut feeling   3. Increase in voices and they become meaner  4. Delusional thoughts   5. Paranoia   6. Crying spells   7. Responding to internal stimuli  8. Nightmares   9. Religious preoccupation     Step 5: INTERNAL COPING STRATEGIES - What can I do on my own to help prevent myself from acting out on my thoughts to harm myself or others?     1. Pacing or walking   2. Talking to the good voices   3. Prayer   4. Cooking   5. Reading books   6. Watching movies  7. Playing video games  8. Write in the journal   9. Art projects   10. Hang out with Kristin Maynard     Step 6: How can I attempt to have safer surroundings in my environment to prevent a crisis?  1. Take care of physical health-- eat right, drink water, and get rest   2. Create a daily and nightly routine   3. Take medications as prescribed   4. Engage in outpatient services   5. Communicate openly with positive supports   6. Practice coping skills regularly     Step 7: Among my supports, whom can I reach out to for help during a crisis?   Dad: [phone]  Mom: [phone]  Kristin Maynard:     [facility]: [phone]  Mental Health Crisis Line: 505-243-1394 / 8382175293    [place]: [phone]      NATIONAL SUICIDE PREVENTION [employer]: 1-800-SUICIDE (1.253-129-9660 or 1.401-711-3398 for deaf & hard of hearing available 24 hours/7days/week) Or text 988  If a crisis develops, and/or I want to harm myself or others, and using the above safety plan is NOT EFFECTIVE, I agree to immediately do the following:     1. Either remove myself from the presence of weapons, medications, or other means of harming myself, or ask a trusted friend/family member to remove such things from my home.   2. Call 911 or go the nearest hospital emergency room.    Additional Contacts:   The Veterans Suicide Hotline (Veterans Crisis Line): 7145868985, press 1 or text to 863-592-5116 (available 24 hours a day, seven days a week):  o [employer] &  online chat: https://sampson.info/  o Lesbian, Gay, Bisexual, Transgender and Questioning (LGBTQ) Suicide Hotline (the [employer]):   367-319-1601 available 24 hours a day, seven days a week)  o [employer] online chat: DyeTool.be (Available 7 days a week (3:00 p.m. - 9:00 p.m. ET / 12:00 p.m. - 6:00 p.m. PT) Text the word Beryle Beams to 1-902-677-3574 (Available on Fridays (4:00 p.m. - 8:00 p.m. ET / 1:00 p.m. - 5:00 p.m. PT)   o [employer] National Helpline - 1-800-662-HELP 628-653-0772)  o The Disaster Distress Helpline 701-197-0750 or text TalkWithUs to (334)186-9533  o Sexual assault hotline: 1.(212)602-4299 available 24/7  o Domestic Violence hotline: 1-800-799-SAFE 302-417-8227) available 24/7.

## 2021-11-01 ENCOUNTER — Encounter: Admit: 2021-11-01 | Discharge: 2021-11-01 | Payer: BC Managed Care – HMO

## 2021-11-01 MED ORDER — DIAZEPAM 5 MG PO TAB
ORAL_TABLET | ORAL | 0 refills | 7.00000 days | Status: AC
Start: 2021-11-01 — End: ?
  Filled 2021-11-01: qty 90, 30d supply, fill #1

## 2021-11-01 MED ORDER — QUETIAPINE 400 MG PO TAB
800 mg | ORAL_TABLET | Freq: Every evening | ORAL | 0 refills | Status: AC
Start: 2021-11-01 — End: ?
  Filled 2021-11-01: qty 60, 30d supply, fill #1

## 2021-11-01 MED ORDER — METOPROLOL TARTRATE 25 MG PO TAB
25 mg | ORAL_TABLET | Freq: Two times a day (BID) | ORAL | 0 refills | 90.00000 days | Status: AC
Start: 2021-11-01 — End: ?
  Filled 2021-11-01: qty 60, 30d supply, fill #1

## 2021-11-01 MED ORDER — DESOG-E.ESTRADIOL/E.ESTRADIOL 0.15-0.02 MGX21 /0.01 MG X 5 PO TAB
1 | ORAL_TABLET | Freq: Every day | ORAL | 0 refills | 30.00000 days | Status: AC
Start: 2021-11-01 — End: ?

## 2021-11-01 MED ORDER — DIVALPROEX 500 MG PO TB24
1500 mg | ORAL_TABLET | Freq: Every evening | ORAL | 0 refills | 30.00000 days | Status: AC
Start: 2021-11-01 — End: ?
  Filled 2021-11-01: qty 90, 30d supply, fill #1

## 2021-11-01 MED ORDER — MELATONIN 5 MG PO TAB
5 mg | ORAL_TABLET | Freq: Every evening | ORAL | 0 refills | 28.00000 days | Status: AC
Start: 2021-11-01 — End: ?

## 2021-11-01 MED ORDER — HYDROXYZINE HCL 25 MG PO TAB
25 mg | ORAL_TABLET | Freq: Three times a day (TID) | ORAL | 0 refills | 30.00000 days | Status: AC | PRN
Start: 2021-11-01 — End: ?
  Filled 2021-11-01: qty 30, 10d supply, fill #1

## 2021-11-01 MED ORDER — FOLIC ACID 1 MG PO TAB
1 mg | ORAL_TABLET | Freq: Every day | ORAL | 0 refills | Status: AC
Start: 2021-11-01 — End: ?
  Filled 2021-11-01: qty 30, 30d supply, fill #1

## 2021-11-01 MED ORDER — FLUPHENAZINE HCL 5 MG PO TAB
5 mg | ORAL_TABLET | Freq: Every evening | ORAL | 0 refills | Status: AC
Start: 2021-11-01 — End: ?
  Filled 2021-11-01: qty 30, 30d supply, fill #1

## 2021-11-01 NOTE — Care Plan
Problem: Mood - Altered  Goal: Stabilize mood  Flowsheets (Taken 10/29/2021 2135 by Louis Meckel, RN)  Stabilize mood:   Assess depression history   Assess depressive symptoms   Assess coping style   Assess ineffective coping signs and symptoms   Establish therapeutic relationships     Problem: Thought Process - Altered  Goal: Demonstration of organized thought processes  Flowsheets (Taken 10/29/2021 0009 by Louis Meckel, RN)  Demonstration of organized thought processes:   Assess cognitive ability   Assess risk for impaired cognition   Assess disturbed thought process signs and symptoms   Assess thought process   Provide environmental regulation - agitation   Assess in reality orientation     Problem: Health Maintenance - Impaired  Goal: Establish therapeutic relationships  Flowsheets (Taken 10/20/2021 2340 by Darlin Drop, RN)  Establish therapeutic relationships:   Encourage expressions of feelings   Involve patient in decision-making process

## 2021-11-01 NOTE — Group Note
Name: Sarayah Bacchi   MRN: [id number]     DOB: 04-08-1996      Age: 26 y.o.  Admission Date: 09/23/2021     LOS: 39 days     Date of Service: 11/01/2021      Group Topic: [place] Leisure Activities  Group Date: 11/01/2021  Start Time: 0915  End Time: 1000  Facilitators: Earl Lites          Number of Participants: 12  Group Focus: coping skills, leisure skills, self-esteem, and social skills  Treatment Modality: Recreation Therapy  Interventions utilized were group exercise and leisure development  Purpose: enhance coping skills, improve communication skills, regain self-worth, and reinforce self-care    Patients exercised to music. Patients shared their name and shared something that was going well for them today or expect to go well. Patients divided into three teams and learned a card game they could play in their leisure time.       Name: Daliyah Date of Birth: 05-11-1996   MR: [id number]      Level of Participation: active   Quality of Participation: attentive, cooperative and drowsy  Interactions with others: gave feedback  Mood/Affect: appropriate  Cognition: goal directed  Progress: Moderate  Response: Patient became very drowsy in the card game and was falling asleep. Patient eventually left the group to go and lay down. Patient participated fully before the card game started.  Plan: to continue treatment

## 2021-11-01 NOTE — Group Note
Name: Kristin Maynard   MRN: [id number]     DOB: April 12, 1996      Age: 26 y.o.  Admission Date: 09/23/2021     LOS: 39 days     Date of Service: 11/01/2021      Group Topic: BH Coping Skills  Group Date: 11/01/2021  Start Time: 1300  End Time: [place]  Facilitators: Candie Chroman          Number of Participants: 12  Group Focus: relaxation  Treatment Modality: Art Therapy  Interventions utilized were assignment and exploration  Purpose: enhance coping skills and express feelings    Group members were encouraged to experiment with watercolor techniques for relaxation and affect regulation.        Name: Kristin Maynard Date of Birth: 11-Sep-1995   MR: [id number]      Level of Participation: active   Quality of Participation: cooperative  Interactions with others: gave feedback  Mood/Affect: flat  Triggers (if applicable): NA  Cognition: loose  Progress: Moderate  Response: Fully participated  Plan: to continue treatment

## 2021-11-01 NOTE — Group Note
Name: Kristin Maynard   MRN: [id number]     DOB: 04/22/1996      Age: 26 y.o.  Admission Date: 09/23/2021     LOS: 39 days     Date of Service: 11/01/2021      Group Topic: BH Coping Skills  Group Date: 11/01/2021  Start Time: 1100  End Time: 1140  Facilitators: Julieta Bellini          Number of Participants: 14  Group Focus: coping skills  Treatment Modality: Group Psychotherapy  Interventions utilized were assignment, exploration, and patient education  Purpose: enhance coping skills, increase insight, and reinforce self-care          Name: Kristin Maynard Date of Birth: 1995-12-18   MR: [id number]      Level of Participation: moderate   Quality of Participation: attentive  Interactions with others: gave feedback  Mood/Affect: appropriate  Triggers (if applicable): none  Cognition: coherent/clear  Progress: Minimal  Response: Pt was attentive at times but also asleep at times.  Plan: to continue treatment

## 2021-11-01 NOTE — Progress Notes
Psychiatry Progress Note  Name: Kristin Maynard          MRN: [id number] DOB: June 23, 1996         Age: 26 y.o.  Admission Date: 09/23/2021  LOS: 39 days    Service: [facility] Adult Psych A    Principal Problem:    Schizophrenia (HCC)  Active Problems:    Pituitary macroadenoma (HCC)    Hyperprolactinemia (HCC)    Elevated BP without diagnosis of hypertension    Sinus tachycardia    Leukocytosis    PLAN  1. Continued admission to [facility]  2. No medication changes made on 4/19  3. Continue Valium 5mg  PO Daily and 10mg  PO QHS  4. Continue Prolixin 5mg  PO QHS (increased on 4/17)  5. Continue Depakote ER 1500mg  PO QHS  6. Continue Folate 1mg  PO Daily  7. Continue Seroquel 800mg  PO QHS       REASONS FOR CONTINUED HOSPITALIZATION  Psychiatric stabilization and medication management.      ______________________________________________________________  Kristin Maynard was seen for follow up today in the consult room.  She reports feeling pretty good today.  She had great sleep last night and denies any difficulty falling asleep or staying asleep.  She denies auditory or visual hallucinations.  She reports mild experiencing auditory hallucinations yesterday but could not make out what the voices were saying.  She describes her mood is good today.  She denies any paranoia or worrying about that she needs to save the world.  She denies SI/HI.    She continues to be observed responding to stimuli but has been calmer and been able to attend/participate in groups. She slept a documented 7 hrs yesterday and did not require any as needed medications.        REVIEW OF SYSTEMS  Review of Systems   Constitutional: Negative for appetite change, chills, fatigue and fever.   HENT: Negative for congestion.    Respiratory: Positive for cough. Negative for shortness of breath.    Gastrointestinal: Negative for nausea and vomiting.   Musculoskeletal: Negative for myalgias.   Skin: Negative for rash.   Neurological: Negative for dizziness. Psychiatric/Behavioral: Negative for confusion, decreased concentration, dysphoric mood, self-injury, sleep disturbance and suicidal ideas.     ______________________________________________________________  OBJECTIVE                 Vital Signs:  Current                Vital Signs: 24 Hour Range   BP: 136/85 (04/19 0800)  Temp: 36.2 ?C (97.1 ?F) (04/19 0800)  Pulse: 108 (04/19 0800)  Respirations: 16 PER MINUTE (04/19 0800)  SpO2: 98 % (04/19 0800) BP: (136-138)/(85-93)   Temp:  [36.2 ?C (97.1 ?F)-36.3 ?C (97.3 ?F)]   Pulse:  [108-116]   Respirations:  [16 PER MINUTE-18 PER MINUTE]   SpO2:  [98 %-100 %]    Intensity Pain Scale (Self Report): (not recorded)      Scheduled Medications:  diazePAM (VALIUM) tablet 10 mg, 10 mg, Oral, QHS  diazePAM (VALIUM) tablet 5 mg, 5 mg, Oral, QDAY  divalproex (DEPAKOTE ER) ER tablet 1,500 mg, 1,500 mg, Oral, QHS  fluPHENAZine HCL (PROLIXIN) tablet 5 mg, 5 mg, Oral, QHS  folic acid (FOLVITE) tablet 1 mg, 1 mg, Oral, QDAY  levonorgestrel-ethinyl estradiol (AVIANE-28, LESSINA-28) tablet 1 tablet, 1 tablet, Oral, QDAY  melatonin tablet 5 mg, 5 mg, Oral, QHS  metoprolol tartrate tablet 25 mg, 25 mg, Oral, BID  QUEtiapine (SEROquel) tablet 800 mg, 800 mg, Oral, QHS        PRN Medications:  acetaminophen Q6H PRN 650 mg at 10/26/21 1412, calcium carbonate TID PRN, fluPHENAZine Q6H PRN **OR** fluPHENAZine Q6H PRN, hydrOXYzine Q6H PRN 25 mg at 10/29/21 1411, LORazepam Q6H PRN 2 mg at 10/28/21 1840 **OR** LORazepam  (ATIVAN)  injection Q6H PRN, ondansetron Q6H PRN 4 mg at 10/15/21 1343, polyethylene glycol 3350 QDAY PRN, senna/docusate QDAY PRN    MENTAL STATUS EXAMINATION  General/Constitutional: appears stated age, dressed appropriately, good hygiene   Eye Contact: good  Behavior: Calm, cooperative; appropriate for conversation  Speech: RRR with normal volume and tone. Good articulation  Mood: good  Affect: appropriate to conversation; mood congruent  Thought Process: linear and goal directed  Thought Content: denies SI, HI. No overt delusions noted   Perception: Denies AVH. Does not appear to be responding to stimuli   Associations: Intact  Insight/Judgment: fair/fair     Physical Exam:  Gait: Ambulates without difficulty. Normal arm swing   ______________________________________________________________  [name], DO

## 2021-11-01 NOTE — Group Note
Name: Kristin Maynard   MRN: [id number]     DOB: 08-17-1995      Age: 26 y.o.  Admission Date: 09/23/2021     LOS: 39 days     Date of Service: 11/01/2021      Group Topic: [place] Goal Setting  Group Date: 11/01/2021  Start Time: 1015  End Time: 1100  Facilitators: Harrell Lark          Number of Participants: 10  Group Focus: communication, concentration, coping skills, feeling awareness/expression, goals/reality orientation, leisure skills, music therapy, problem solving, relaxation, reminiscence, and social skills  Treatment Modality: Music Therapy  Interventions utilized were other Goal Bridges  Purpose: enhance coping skills, express feelings, improve communication skills, regain self-worth, and reinforce self-care    Goal Bridges: The therapist provided the patients with a bridge illustration and a marker. Patients were instructed to write where they are currently at one end, and their goal at the other end. Between the two ends patients provided coping skills they can use, motivational songs, and the steps needed to complete their goal. While completing these bridges, the therapist played requested motivating songs provided by the patients. Patients then shared their bridges if comfortable.      Name: Kristin Maynard Date of Birth: 26-Mar-1996   MR: [id number]      Level of Participation: patient not present for group   Plan: to continue treatment

## 2021-11-01 NOTE — Behavioral Health Treatment Team
TREATMENT TEAM NOTE  Name: Kristin Maynard          MRN: [id number]              DOB: 1995/08/08          Age: 26 y.o.  Admission Date: 09/23/2021                 Attendees:   Psychiatrist: Arnette Norris, DO  Psychiatrist: Alfredia Ferguson, APRN  Nurse Supervisor: Cyndia Skeeters, RN, BSN  Nurse: Dede Query, RN  Pharmacist: Dahlia Client, PharmD  Therapist: Dewayne Hatch, [place], [place]  Therapist: Neita Carp, [place]  Case Manager: Tinnie Gens, LMSW  Case Manager: April Cordero, [place]  Utilization Review: Areatha Keas, RN    Significant Points Discussed:  Still responding to internal stimuli, still pacing unit  Calmer in groups  No PRNs given, slept 7 hours    Projected Discharge Date:  Discharge tomorrow

## 2021-11-01 NOTE — Care Plan
Problem: Harm to self, high risk of suicide  Goal: Absence of Harm to Self  Outcome: Goal Ongoing     Problem: Health Maintenance - Impaired  Goal: Knowledge of health maintenance  Outcome: Goal Ongoing     Problem: Cognitive-Perceptual Pattern - Impaired  Goal: Demonstration of organized thought processes  Outcome: Goal Ongoing

## 2021-11-02 ENCOUNTER — Encounter: Admit: 2021-11-02 | Discharge: 2021-11-02 | Payer: BC Managed Care – HMO

## 2021-11-02 NOTE — Progress Notes
Name: Kristin Maynard   MRN: [id number]     DOB: 02-09-1996      Age: 26 y.o.  Admission Date: 09/23/2021     LOS: 40 days     Date of Service: 11/02/2021        Discharge Note:    Condition    Patient's mood and though contents have improved and are appropriate for discharge.    Disposition    Patient is leaving with father to return to personal residence. All items from room, belonging bin, medication room, and safe are returned to the patients. Pt with no personal cell phone. Medications are provided.     Discharge Instructions    Discharge instructions and psychiatry summary of care were discussed and provided. A copy will be transmitted to the next level of care provider within 24 hours after discharge. Patient expresses understanding of all information provided and voices no concerns at time of discharge.

## 2021-11-02 NOTE — Care Coordination-Inpatient
Care Coordination - Inpatient Note      Patient Name:   Kristin Maynard    MRN:  [id number]    8:24am-8:26am  Writer called patient's dad, [name] [phone] to inform him that we are still moving forward with discharge today. [name] will be here at around 1:00pm.     Demaris Callander  11/02/2021

## 2021-11-02 NOTE — Discharge Instructions - Supplementary Instructions
Therapy Discharge Recommendations:  Therapy Aftercare Recommendations: Patient would benefit from medication management, case management, and outpatient individual counseling to address ongoing mental health concerns.    Any outpatient services can be conducted by tele-video/tele-health to prevent accidental spread and/ exposure to COVID-19, also known as the coronavirus.    If the patient does not have access to secure HIPAA-compliant tele-video/tele-health platforms and/or the Internet, please let your outpatient provider know.    The U.S. Department Health and Human Services is allowing tele-video/tele-health to be conducted with non-secure means, such as Apple FaceTime, [employer], [employer], and Skype, if patient cannot use any of the other listed platforms.  Please ask your outpatient counselor about the [place] "Notice of Enforcement Discretion for Telehealth Remote Communications During the KWIOX-[employer] Public Health Emergency".

## 2021-11-02 NOTE — Group Note
Name: Kristin Maynard   MRN: [id number]     DOB: Oct 10, 1995      Age: 26 y.o.  Admission Date: 09/23/2021     LOS: 40 days     Date of Service: 11/02/2021      Group Topic: BH Positive Problem Solving  Group Date: 11/02/2021  Start Time: 1245  End Time: 1345  Facilitators: Candie Chroman          Number of Participants: 8  Group Focus: problem solving  Treatment Modality: Art Therapy  Interventions utilized were assignment and exploration  Purpose: enhance coping skills and express feelings    Group members were provided with a variety of papers, glue sticks, and oil pastels and encouraged to create an abstract collage about something they enjoy. Activity is intended to promote creative expression, problem solving, coping skills, and healthy leisure interests.             Name: Kristin Maynard Date of Birth: 1995-09-29   MR: [id number]      Level of Participation: patient not present for group   Plan: to continue treatment

## 2021-11-02 NOTE — Group Note
Name: Kristin Maynard   MRN: [id number]     DOB: [birth year]      Age: 27 y.o.  Admission Date: 09/23/2021     LOS: 40 days     Date of Service: 11/02/2021      Group Topic: BH Self-Care  Group Date: 11/02/2021  Start Time: 0915  End Time: 1015  Facilitators: Garner Nash          Number of Participants: 13  Group Focus: communication, concentration, coping skills, feeling awareness/expression, leisure skills, music therapy, other positive thinking, problem solving, reminiscence, self-esteem, and social skills  Treatment Modality: Music Therapy  Interventions utilized were other Positive Experiences  Purpose: enhance coping skills, express feelings, improve communication skills, regain self-worth, reinforce self-care, and promote positive thinking    Positive Experiences: The therapist provided patients with a Positive Experiences (aid via therapistaid.com), worksheet. Patients were instructed to complete the experiences to the best of their abilities, and then on the back of the aid patients wrote positive songs. While completing these, patients shared their positive songs. The group then discussed their positive experiences. The purpose of this intervention was to promote positive thinking, coping skills, expression, communication, and self-care.    Name: Kristin Maynard Date of Birth: July 30, 1995   MR: [id number]      Level of Participation: active   Quality of Participation: attentive, cooperative and engaged  Interactions with others: gave feedback and pulled from group for clinical purposes  Mood/Affect: appropriate  Cognition: coherent/clear  Progress: Moderate  Response: The patient arrived late and was provided materials to actively participate within the group. The patient actively engaged with group materials and provided a song for group. Throughout the group, the patient sang along to familiar songs and moved to the music. The patient minimally responded to internal stimuli. During the group, the patient was pulled for clinical purposes and returned to continue previous levels of engagement.  Plan: to continue treatment

## 2021-11-02 NOTE — Progress Notes
Psychiatry Progress Note  Name: Kristin Maynard          MRN: [id number] DOB: 08/12/1995         Age: 26 y.o.  Admission Date: 09/23/2021  LOS: 40 days    Service: [facility] Adult Psych A    Principal Problem:    Schizophrenia (HCC)  Active Problems:    Pituitary macroadenoma (HCC)    Hyperprolactinemia (HCC)    Elevated BP without diagnosis of hypertension    Sinus tachycardia    Leukocytosis    PLAN  1. Discharge to home on 11/02/21  2. No medication changes made on 4/20  3. Continue Valium 5mg  PO Daily and 10mg  PO QHS  4. Continue Prolixin 5mg  PO QHS (increased on 4/17)  5. Continue Depakote ER 1500mg  PO QHS  6. Continue Folate 1mg  PO Daily  7. Continue Seroquel 800mg  PO QHS      ______________________________________________________________  SUBJECTIVE  Kristin Maynard was seen for follow up today where she reports feeling really good. She is sleeping and eating well. She woke up 1-2 times last night but easily fell back asleep. She had some questions about cost of medication and worsened memory that she experienced with Abilify. We reviewed her concerns and she verbalized understanding. She is looking forward to seeing her family. She reports AH as barely. She denies SI/HI/VH.     Kristin Maynard was drowsy during groups yesterday. She did participate in those that she attended. She continues to be observed responding to stimuli at times. She became agitated overnight but was redirectable and did not require PRN medications. She slept 6.75 hrs per documentation.         REVIEW OF SYSTEMS  Review of Systems   Constitutional: Negative for appetite change, chills, fatigue and fever.   HENT: Negative for congestion.    Respiratory: Negative for shortness of breath.    Gastrointestinal: Negative for constipation, diarrhea, nausea and vomiting.   Musculoskeletal: Negative for myalgias.   Skin: Negative for rash.   Neurological: Negative for dizziness.   Psychiatric/Behavioral: Negative for decreased concentration, dysphoric mood, sleep disturbance and suicidal ideas. The patient is not nervous/anxious.      ______________________________________________________________  OBJECTIVE                 Vital Signs:  Current                Vital Signs: 24 Hour Range   BP: 135/84 (04/19 1939)  Temp: 36.4 ?C (97.6 ?F) (04/19 1939)  Pulse: 102 (04/19 1939)  Respirations: 18 PER MINUTE (04/19 1939)  SpO2: 98 % (04/19 1939) BP: (135)/(84)   Temp:  [36.4 ?C (97.6 ?F)]   Pulse:  [102]   Respirations:  [18 PER MINUTE]   SpO2:  [98 %]    Intensity Pain Scale (Self Report): (not recorded)      Scheduled Medications:  diazePAM (VALIUM) tablet 10 mg, 10 mg, Oral, QHS  diazePAM (VALIUM) tablet 5 mg, 5 mg, Oral, QDAY  divalproex (DEPAKOTE ER) ER tablet 1,500 mg, 1,500 mg, Oral, QHS  fluPHENAZine HCL (PROLIXIN) tablet 5 mg, 5 mg, Oral, QHS  folic acid (FOLVITE) tablet 1 mg, 1 mg, Oral, QDAY  levonorgestrel-ethinyl estradiol (AVIANE-28, LESSINA-28) tablet 1 tablet, 1 tablet, Oral, QDAY  melatonin tablet 5 mg, 5 mg, Oral, QHS  metoprolol tartrate tablet 25 mg, 25 mg, Oral, BID  QUEtiapine (SEROquel) tablet 800 mg, 800 mg, Oral, QHS        PRN Medications:  acetaminophen Q6H  PRN 650 mg at 10/26/21 1412, calcium carbonate TID PRN, fluPHENAZine Q6H PRN **OR** fluPHENAZine Q6H PRN, hydrOXYzine Q6H PRN 25 mg at 10/29/21 1411, LORazepam Q6H PRN 2 mg at 10/28/21 1840 **OR** LORazepam  (ATIVAN)  injection Q6H PRN, ondansetron Q6H PRN 4 mg at 10/15/21 1343, polyethylene glycol 3350 QDAY PRN, senna/docusate QDAY PRN    MENTAL STATUS EXAMINATION  General/Constitutional: appears stated age, dressed appropriately, good hygiene   Eye Contact: good  Behavior: Calm, cooperative; appropriate for conversation  Speech: RRR with normal volume and tone. Good articulation  Mood: good  Affect: appropriate to conversation; mood congruent  Thought Process: linear and goal directed  Thought Content: denies SI, HI. No overt delusions noted   Perception: Endorses AH, denies VH Does not appear to be responding to stimuli   Associations: Intact  Insight/Judgment: fair/fair     Physical Exam:  Gait: Ambulates without difficulty. Normal arm swing   ______________________________________________________________  [name], DO

## 2021-11-02 NOTE — Group Note
Name: Rihanna Marseille   MRN: [id number]     DOB: 1996/01/22      Age: 26 y.o.  Admission Date: 09/23/2021     LOS: 40 days     Date of Service: 11/02/2021      Group Topic: BH Leisure Activities  Group Date: 11/02/2021  Start Time: 1345  End Time: 1445  Facilitators: Earl Lites          Number of Participants: 8  Group Focus: healthy friendships, leisure skills, self-esteem, and social skills  Treatment Modality: Recreation Therapy  Interventions utilized were group exercise, leisure development, and mental fitness  Purpose: improve communication skills and reinforce self-care    Patients played team Sequence game. Patients were divided into three teams. The first team to get two, five in a row with their cards wins the game.       Name: Dayonna Date of Birth: 1996-06-14   MR: [id number]      Level of Participation: patient not present for group   Plan: to continue treatment

## 2021-11-02 NOTE — Care Plan
Problem: Mood - Altered  Goal: Stabilize mood  Flowsheets (Taken 10/29/2021 2135 by Louis Meckel, RN)  Stabilize mood:   Assess depression history   Assess depressive symptoms   Assess coping style   Assess ineffective coping signs and symptoms   Establish therapeutic relationships     Problem: Health Maintenance - Impaired  Goal: Establish therapeutic relationships  Flowsheets (Taken 10/20/2021 2340 by Darlin Drop, RN)  Establish therapeutic relationships:   Encourage expressions of feelings   Involve patient in decision-making process     Problem: Coping - Ineffective, Family  Goal: Effective Coping  Flowsheets (Taken 10/17/2021 2343)  Effective coping:   Assess support system   Provide coping support

## 2021-11-02 NOTE — Behavioral Health Treatment Team
TREATMENT TEAM NOTE  Name: Kristin Maynard          MRN: [id number]              DOB: 09-Nov-1995          Age: 26 y.o.  Admission Date: 09/23/2021                 Attendees:   Psychiatrist: Arnette Norris, DO  Psychiatrist: Alfredia Ferguson, APRN  Nurse Supervisor: Cyndia Skeeters, RN, BSN  Nurse: Dede Query, RN  Pharmacist: Lurlean Nanny, PharmD  Therapist: Dewayne Hatch, [place], [place]  Therapist: Neita Carp, [place]  Case Manager: Tinnie Gens, LMSW  Case Manager: April Cordero, [place]  Utilization Review: Areatha Keas, RN        Significant Points Discussed: Medication and treatment compliant. Attends groups. More agitated on night shift. Slept 6.75 hours.       Projected Discharge Date: Today

## 2021-11-02 NOTE — Group Note
Name: Kristin Maynard   MRN: [id number]     DOB: 01/23/1996      Age: 26 y.o.  Admission Date: 09/23/2021     LOS: 40 days     Date of Service: 11/02/2021      Group Topic: BH Boundaries  Group Date: 11/02/2021  Start Time: 1100  End Time: 1200  Facilitators: Dewayne Hatch          Number of Participants: 12  Group Focus: anxiety, coping skills, depression, family, medication education, personal responsibility, and self-awareness  Treatment Modality: Patient-Centered Therapy and Psychoeducation  Interventions utilized were clarification, exploration, patient education, problem solving, and support  Purpose: enhance coping skills, express feelings, improve communication skills, increase insight, and reinforce self-care    Provider asked group for topics to discuss during group that relate to their mental health or mental health as a larger topic. Provider lead group in discussion of the following topics:   Communication with others that don't fully understand mental illness   Boundaries   Common medication side effects   Restarting old habits and establishing new ones          Name: Kristin Maynard Date of Birth: Mar 29, 1996   MR: [id number]      Level of Participation: moderate   Quality of Participation: attentive and drowsy  Interactions with others: gave feedback  Mood/Affect: appropriate  Triggers (if applicable): None  Cognition: concrete  Progress: Moderate  Response: Present. Midway through group, sleeping/snoring  Plan: to continue treatment

## 2022-01-19 IMAGING — MR BRAINWW
18 of 22 series · 33 of 48 positions shown · non-contrast
Comparison: none

[Series 5: T1 · sagittal · 5.0mm · 0.80mm/px · 2 of 27 slices shown (1 of 9)]
[im 1/27]
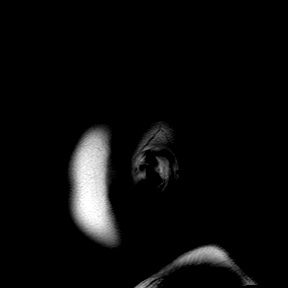
[im 27/27]
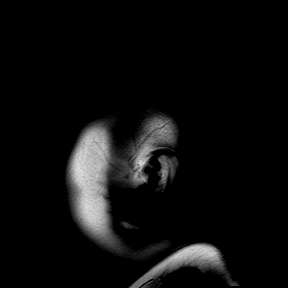

[Series 6: DWI · axial · 5.0mm · 1.44mm/px · z∈[-105,+50]mm · 3 of 27 slices shown (1 of 3)]
[im 1/27]
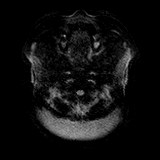
[im 14/27]
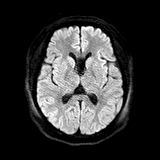
[im 27/27]
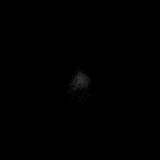

[Series 6: DWI · axial · 5.0mm · 1.44mm/px · z∈[-105,+50]mm · 3 of 27 slices shown (2 of 3)]
[im 1/27]
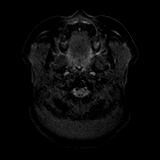
[im 14/27]
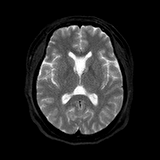
[im 27/27]
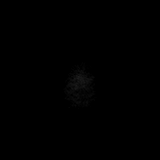

[Series 7: DWI · axial · 5.0mm · 1.44mm/px · z∈[-105,+44]mm · 3 of 26 slices shown (3 of 3)]
[im 1/26]
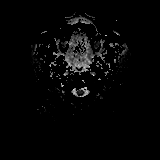
[im 13/26]
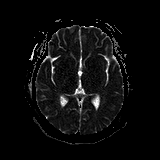
[im 26/26]
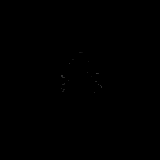

[Series 8: FLAIR · axial · 5.0mm · 0.72mm/px · z∈[-105,+49]mm · 3 of 27 slices shown]
[im 1/27]
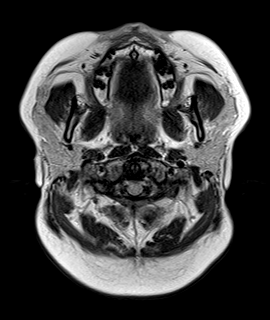
[im 14/27]
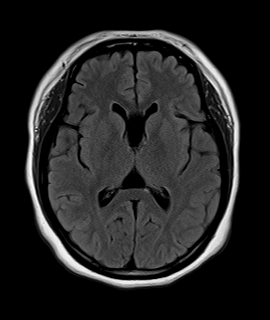
[im 27/27]
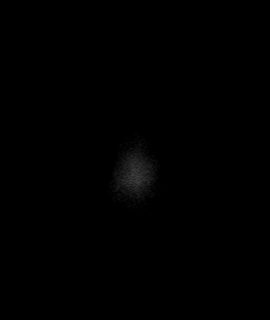

[Series 9: T2 · axial · 5.0mm · 0.45mm/px · z∈[-105,+49]mm · 3 of 27 slices shown (1 of 2)]
[im 1/27]
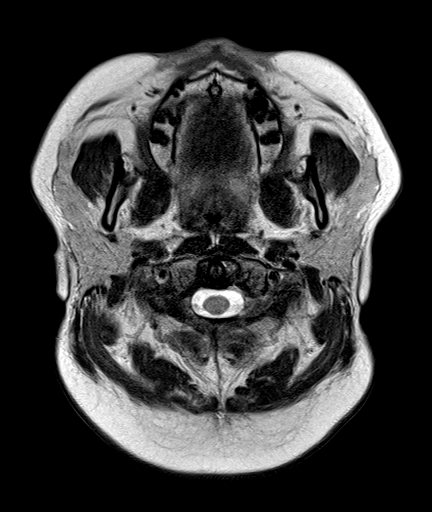
[im 14/27]
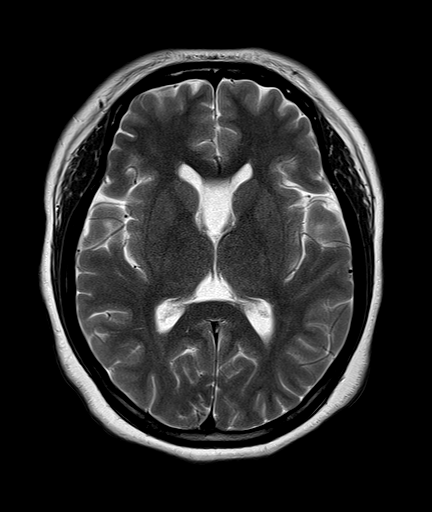
[im 27/27]
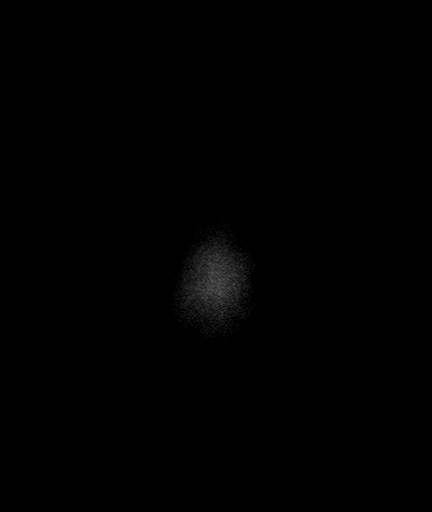

[Series 10: T1 · axial · 5.0mm · 0.72mm/px · z∈[-105,+49]mm · 3 of 27 slices shown (2 of 9)]
[im 1/27]
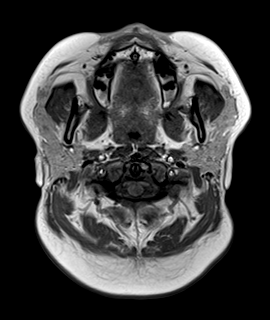
[im 14/27]
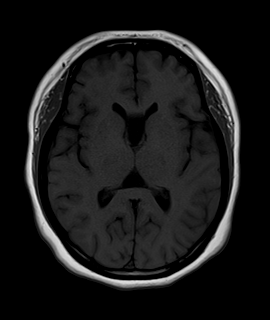
[im 27/27]
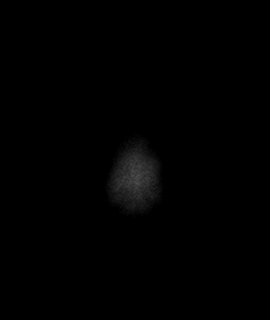

[Series 15: T1 · sagittal · 3.0mm · 0.59mm/px · 1 of 12 slices shown (3 of 9)]
[im 1/12]
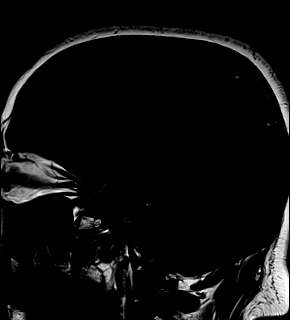

[Series 16: T1 · coronal · 3.0mm · 0.56mm/px · 1 of 15 slices shown (4 of 9)]
[im 1/15]
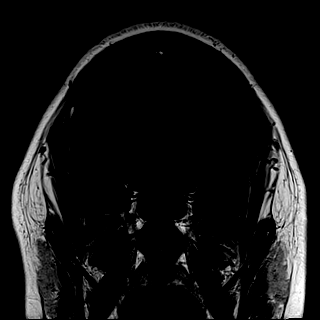

[Series 17: T2 · coronal · 3.0mm · 0.31mm/px · 1 of 15 slices shown (2 of 2)]
[im 1/15]
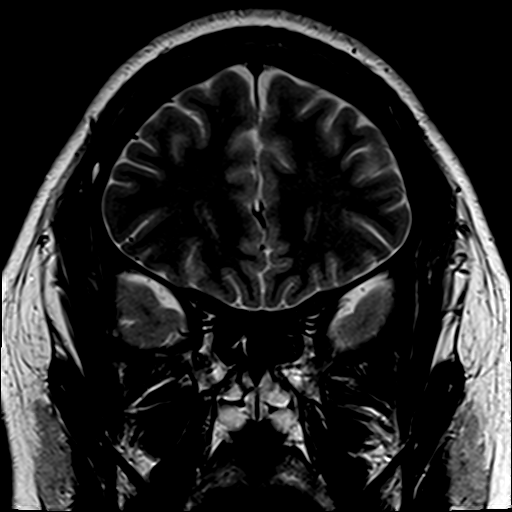

[Series 18: T1 · coronal · 3.0mm · 0.49mm/px · 1 of 12 slices shown (5 of 9)]
[im 1/12]
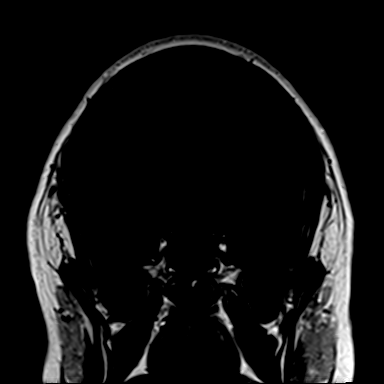

[Series 19: T1 · coronal · 3.0mm · 0.49mm/px · 1 of 12 slices shown (6 of 9)]
[im 1/12]
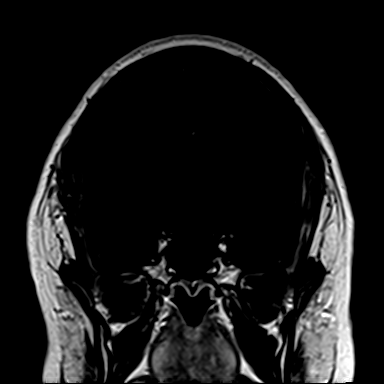

[Series 20: T1 · coronal · 3.0mm · 0.49mm/px · 1 of 12 slices shown (7 of 9)]
[im 1/12]
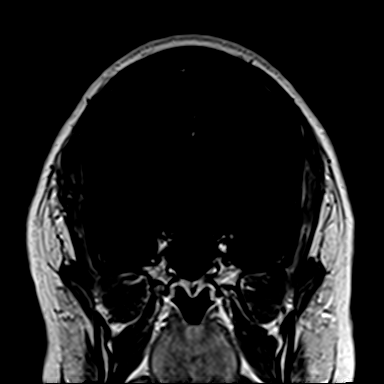

[Series 21: T1 · coronal · 3.0mm · 0.49mm/px · 1 of 12 slices shown (8 of 9)]
[im 1/12]
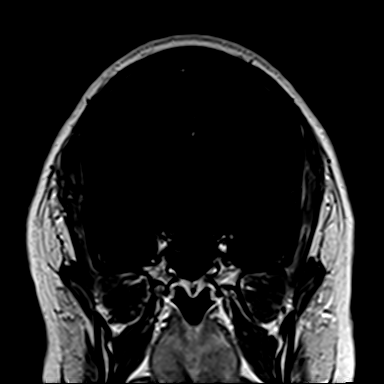

[Series 22: T1 · coronal · 3.0mm · 0.49mm/px · 1 of 12 slices shown (9 of 9)]
[im 1/12]
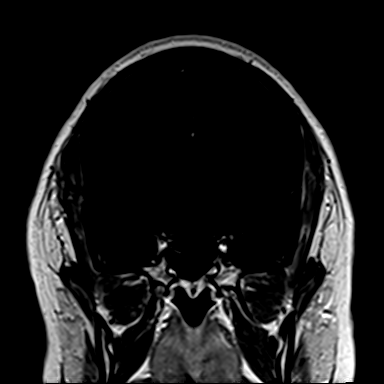

[Series 23: T1 post-contrast · sagittal · 3.0mm · 0.59mm/px · 1 of 12 slices shown (1 of 2)]
[im 1/12]
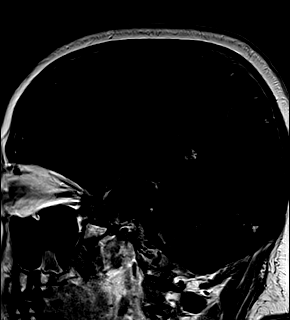

[Series 24: T1 post-contrast · coronal · 3.0mm · 0.56mm/px · 1 of 15 slices shown (2 of 2)]
[im 1/15]
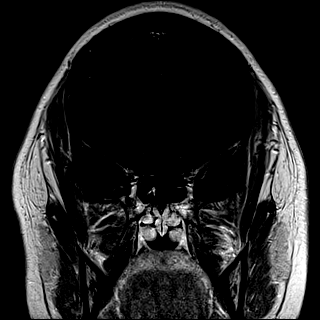

[Series 25: T1 fat-sat post-contrast · axial · 5.0mm · 0.72mm/px · z∈[-101,+54]mm · 3 of 27 slices shown]
[im 1/27]
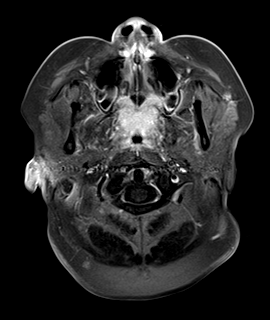
[im 14/27]
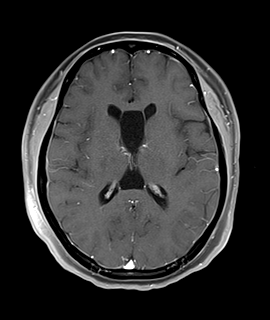
[im 27/27]
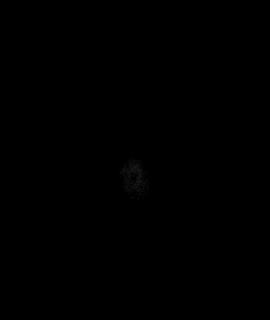

[33 of 48 positions shown; findings below may reference images not displayed]

EXAM

MR head/brain wo/w con (pituitary protocol)

INDICATION

Pituitary adenoma

TECHNIQUE

Multiplanar, multisequence imaging of the brain (pituitary protocol) was performed with and without
contrast contrast.

COMPARISONS

None available at the time of dictation.

FINDINGS

Parenchyma: No diffusion restriction, acute hemorrhage, midline shift, or herniation. Large
pituitary mass with significant suprasellar extension and superior mass effect and displacement of
the of the chiasm. Lesion measures approximately 1.5 x 1.7 x 2.4 cm. Mass effect against the right
hypothalamus with superior ascent. Abutment without encasement of bilateral cavernous sinuses, right
greater than left. Intrinsic areas of nonenhancing T2 hyperintensity. No associated susceptibility
artifact.

Ventricles: No hydrocephalus. Cavum septum pellucidum and vergae.

Extra-axial spaces: No extra-axial collection.

Flow voids: Intact.

Other: Bony calvarium is intact. The visualized paranasal sinuses and mastoid air cells are
essentially clear.

IMPRESSION
1. Extensive pituitary maicroadenoma measuring up to 2.4 cm craniocaudal.
2. Concurrent mass effect against the optic chiasm and right hypothalamus. No cavernous sinus
invasion.

Tech Notes:

FU PITUTARY ANDENOMA. LAST SCAN [id number].  WEIGHT GAIN, MEDICATION NOT WORKING.  15 ML GADAVIST RG

## 2022-01-29 ENCOUNTER — Encounter: Admit: 2022-01-29 | Discharge: 2022-01-29 | Payer: BC Managed Care – HMO

## 2022-01-29 ENCOUNTER — Ambulatory Visit: Admit: 2022-01-29 | Discharge: 2022-01-29 | Payer: BC Managed Care – HMO

## 2022-01-29 DIAGNOSIS — E8881 Metabolic syndrome: Secondary | ICD-10-CM

## 2022-01-29 DIAGNOSIS — D352 Benign neoplasm of pituitary gland: Secondary | ICD-10-CM

## 2022-01-29 DIAGNOSIS — E221 Hyperprolactinemia: Secondary | ICD-10-CM

## 2022-01-29 DIAGNOSIS — F209 Schizophrenia, unspecified: Secondary | ICD-10-CM

## 2022-01-29 DIAGNOSIS — N911 Secondary amenorrhea: Secondary | ICD-10-CM

## 2022-01-31 ENCOUNTER — Encounter: Admit: 2022-01-31 | Discharge: 2022-01-31 | Payer: BC Managed Care – HMO

## 2022-01-31 DIAGNOSIS — D352 Benign neoplasm of pituitary gland: Secondary | ICD-10-CM

## 2022-01-31 MED ORDER — CEFAZOLIN INJ 1GM IVP
2 g | INTRAVENOUS | 0 refills
Start: 2022-01-31 — End: ?

## 2022-01-31 MED ORDER — CEFAZOLIN INJ 1GM IVP
2 g | Freq: Once | INTRAVENOUS | 0 refills
Start: 2022-01-31 — End: ?

## 2022-02-01 ENCOUNTER — Encounter: Admit: 2022-02-01 | Discharge: 2022-02-01 | Payer: BC Managed Care – HMO

## 2022-02-01 NOTE — Progress Notes
This encounter was created in error. Please disregard.

## 2022-02-05 ENCOUNTER — Ambulatory Visit: Admit: 2022-02-05 | Discharge: 2022-02-05 | Payer: BC Managed Care – HMO

## 2022-02-05 ENCOUNTER — Inpatient Hospital Stay: Admit: 2022-02-05 | Discharge: 2022-02-05 | Payer: BC Managed Care – HMO

## 2022-02-05 ENCOUNTER — Encounter: Admit: 2022-02-05 | Discharge: 2022-02-05 | Payer: BC Managed Care – HMO

## 2022-02-05 DIAGNOSIS — F209 Schizophrenia, unspecified: Secondary | ICD-10-CM

## 2022-02-05 DIAGNOSIS — H5347 Heteronymous bilateral field defects: Secondary | ICD-10-CM

## 2022-02-05 DIAGNOSIS — N911 Secondary amenorrhea: Secondary | ICD-10-CM

## 2022-02-05 DIAGNOSIS — D352 Benign neoplasm of pituitary gland: Secondary | ICD-10-CM

## 2022-02-05 DIAGNOSIS — E221 Hyperprolactinemia: Secondary | ICD-10-CM

## 2022-02-05 DIAGNOSIS — E8881 Metabolic syndrome: Secondary | ICD-10-CM

## 2022-02-05 NOTE — Progress Notes
Body mass index is 42.43 kg/m.      OCT OPTIC NERVE        Optic Nerve  Right Eye  Findings location Nasal Optic nerve findings: Abnormal Thinning Progression has no prior data.     Left Eye  Findings location Nasal Optic nerve findings: Abnormal Thinning Progression has no prior data.     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     Right Eye  Threshold was 24-2. Strategy was SITA Standard. Findings location Superior Bi-temporal Defect     Left Eye  Threshold was 24-2. Strategy was SITA Standard. Findings location Superior Bi-temporal Defect               Assessment and Plan:  1.  Pituitary macroadenoma:  This nice young woman today demonstrates superior bitemporal visual field defects consistent with her history, and trace nasal RNFL thinning that is probably related to the tilted optic discs.  We suggest repeat visual field and OCT testing with refraction, about 3 months post operatively.

## 2022-02-06 ENCOUNTER — Encounter: Admit: 2022-02-06 | Discharge: 2022-02-06 | Payer: BC Managed Care – HMO

## 2022-02-06 ENCOUNTER — Ambulatory Visit: Admit: 2022-02-06 | Discharge: 2022-02-06 | Payer: BC Managed Care – HMO

## 2022-02-06 DIAGNOSIS — D352 Benign neoplasm of pituitary gland: Secondary | ICD-10-CM

## 2022-02-21 ENCOUNTER — Ambulatory Visit: Admit: 2022-02-21 | Discharge: 2022-02-21 | Payer: BC Managed Care – HMO

## 2022-02-21 ENCOUNTER — Encounter: Admit: 2022-02-21 | Discharge: 2022-02-21 | Payer: BC Managed Care – HMO

## 2022-02-21 DIAGNOSIS — N911 Secondary amenorrhea: Secondary | ICD-10-CM

## 2022-02-21 DIAGNOSIS — D352 Benign neoplasm of pituitary gland: Secondary | ICD-10-CM

## 2022-02-21 DIAGNOSIS — F209 Schizophrenia, unspecified: Secondary | ICD-10-CM

## 2022-02-21 DIAGNOSIS — E8881 Metabolic syndrome: Secondary | ICD-10-CM

## 2022-02-21 DIAGNOSIS — E221 Hyperprolactinemia: Secondary | ICD-10-CM

## 2022-02-21 NOTE — Pre-Anesthesia Patient Instructions
PREPROCEDURE INFORMATION    Arrival at the hospital  [facility]  [address]  [place], [place] [address]    Park in the P5 parking garage located at [address] [place], [place] [address].   If parking in the P5 garage, take the east elevators in the parking garage to the second level and walk to the entrance of the [employer].    Enter through the 1st floor main entrance and check in with Information Desk.   If you are a woman between the ages of 28 and 51, and have not had a hysterectomy, you will be asked for a urine sample prior to surgery.  Please do not urinate before arriving in the Surgery Waiting Room.  Once there, check in and let the attendant know if you need to provide a sample.    You will receive a call with your surgery arrival time between 2:30pm and 4:30pm the last business day before your procedure.  If you do not receive a call, please call [phone] before 4:30pm or [phone] after 4:30pm.    Eating or drinking before surgery  Do not eat or drink anything after 11:00 p.m. the day before your procedure (including gum, mints, candy, or chewing tobacco) OR follow the specific instructions you were given by your Surgeon.  You may have WATER ONLY up to 2 hours before arriving at the hospital.    Planning transportation for outpatient procedure  For your safety, you will need to arrange for a responsible ride/person to accompany you home due to sedation or anesthesia with your procedure.  An [employer], taxi or other public transportation driver is not considered a responsible person to accompany you home.    Bath/Shower Instructions  Take a bath or shower using the special soap given to you in PAC. Use half the bottle the night before, and the other half the morning of your procedure. Use clean towels with each bath or shower.  Put on clean clothes after bath or shower.  Avoid using lotion and oils.  Sleep on clean sheets if bath or shower is done the night before procedure.    Morning of your procedure:  Brush your teeth and tongue  Do not smoke, vape, chew or user any tobacco products.  Do not shave the area where you will have surgery.  Remove nail polish, makeup and all jewelry (including piercings) before coming to the hospital.  Dress in clean, loose, comfortable clothing.    Valuables  Leave money, credit cards, jewelry, and any other valuables at home. The [facility] is not responsible for the loss or breakage of personal items.    What to bring to the hospital  ID/Insurance card  Medical Device card  Official documents for legal guardianship  Copy of your Living Will, Advanced Directives, and/or Durable Power of Attorney. If you have these documents, please bring them to the admissions office on the day of your surgery to be scanned into your records.  Do not bring medications from home unless instructed by a pharmacist.  CPAP/BiPAP machine (including all supplies)  Walker, cane, or motorized scooter  Cases for glasses/hearing aids/contact lens (bring solutions for contacts)     Notify us at [employer]: [phone] on the day of your procedure if:  You need to cancel your procedure.  You are going to be late.    Notify your surgeon if:  There is a possibility that you are pregnant.  You  become ill with a cough, fever, sore throat, nausea, vomiting or flu-like symptoms.  You have any open wounds/sores that are red, painful, draining, or are new since you last saw the doctor.  You need to cancel your procedure.    Preparing to get your medications at discharge  Your surgeon may prescribe you medications to take after your procedure.  If you like the convenience of having your medications filled here at [facility], please do the following:  Go to [facility] pharmacy after your [facility] appointment to put a credit card on file.  Call [facility] pharmacy at [phone] (Monday-Friday 7am-9pm or Saturday and Sunday 9am-5pm) to put a credit card on file.  Bring a credit card or cash on the day of your procedure- please leave with a family member rather than bringing it into the preop area.    Current Visitor Policy:  Visitors must be free of fever and symptoms to be in our facilities.  No more than 2 visitors per patient are allowed.  Additional guidelines may vary, based on patient care area or patient's condition.  Patients in semiprivate rooms may have visitors, but visits should be coordinated so only two total visitors are in a room at a time due to space limitations.  Children younger than age 44 are allowed to visit inpatients.    Thank you for participating in your Preoperative Assessment Clinic visit today.    If you have any changes to your health or hospitalizations between now and your surgery, please call us at [phone] for Main instructions.    Instructions given to patient via: MyChart and verbal

## 2022-02-21 NOTE — Unmapped
[facility] Anesthesia Pre-Procedure Evaluation    Name: Kristin Maynard      MRN: [id number]     DOB: 07-21-1995     Age: 26 y.o.     Sex: female   _________________________________________________________________________     Procedure Info:   Procedure Information     Date/Time: 02/27/22 1350    Procedures:       endoscopic transnasal microscopic transsphenoidal resection of sellar/suprasellar mass with possible flaps/graft, possible abdominal fat graft, possible lumbar drain - 3 hrs total case length      STEREOTACTIC COMPUTER-ASSISTED CRANIAL PROCEDURE - INTRADURAL - [employer] ent nav      endoscopic transnasal microscopic transsphenoidal resection of sellar/suprasellar mass with possible flaps/graft, possible abdominal fat graft, possible lumbar drain    Location: CA3 AV40 / CA3 OR/Periop    Surgeons: Maryruth Eve, MD; Royetta Crochet, MD          Physical Assessment  Vital Signs (last filed in past 24 hours):  BP: 119/76 (08/09 1357)  Temp: 36.6 ?C (97.8 ?F) (08/09 1357)  Pulse: 108 (08/09 1357)  Respirations: 18 PER MINUTE (08/09 1357)  SpO2: 96 % (08/09 1357)  O2 Device: None (Room air) (08/09 1357)  Height: 154.9 cm (5' 1) (08/09 1357)  Weight: 104.1 kg (229 lb 9.6 oz) (08/09 1357)      Patient History   No Known Allergies     Current Medications    Medication Directions   divalproex (DEPAKOTE ER) 500 mg ER tablet Take three tablets by mouth at bedtime daily. Take with food.   fluPHENAZine (PROLIXIN) 1 mg tablet Take one tablet by mouth daily.   folic acid 0.8 mg cap Take one capsule by mouth daily.   melatonin 5 mg tablet Take one tablet by mouth at bedtime as needed.   QUEtiapine (SEROQUEL) 300 mg tablet Take one tablet by mouth at bedtime daily.   REXULTI 1 mg tablet Take 1.5 mg by mouth daily.       Review of Systems/Medical History      Patient summary reviewed  Nursing notes reviewed  Pertinent labs reviewed    PONV Screening: Non-smoker and Female sex  No history of anesthetic complications  No family history of anesthetic complications      Airway - negative        Pulmonary      Not a current smoker        No shortness of breath      Cardiovascular         Exercise tolerance: >4 METS      Beta Blocker therapy: No      No hypertension,             No palpitations    No angina      No indications/hx of CHF        No syncope      GI/Hepatic/Renal           No GERD,       No liver disease:       No renal disease:          Neuro/Psych       Seizures, well controlled      No CVA      Headaches      Neuropathy (BUE)        Psychiatric history (Schizophrenia )          Psychosis: Hx of psychosis       Bitemporal vision  loss      Musculoskeletal - negative          Endocrine/Other       No diabetes        No hypothyroidism      No history of blood transfusion        Obesity: Class 3 (BMI 40-49.9)      Constitution - negative   Physical Exam    Airway Findings      Mallampati: III      Neck ROM: full      Mouth opening: good      Upper Lip Bite Test: 1      Airway patency: adequate    Dental Findings: Negative      Cardiovascular Findings:       Rhythm: regular      Rate: abnormal      No murmur, no weak pulses      Comments: tachycardic    Pulmonary Findings:       Breath sounds clear to auscultation.    Neurological Findings:       Alert and oriented x 3    Constitutional findings:       No acute distress      Well-developed       Diagnostic Tests  Hematology:   Lab Results   Component Value Date    HGB 12.3 10/06/2021    HCT 37.4 10/06/2021    PLTCT 380 10/06/2021    WBC 9.1 10/06/2021    NEUT 77 09/23/2021    ANC 10.78 09/23/2021    ALC 2.31 09/23/2021    MONA 5 09/23/2021    AMC 0.70 09/23/2021    EOSA 0 09/23/2021    ABC 0.09 09/23/2021    MCV 88.4 10/06/2021    MCH 29.1 10/06/2021    MCHC 32.9 10/06/2021    MPV 7.2 10/06/2021    RDW 13.8 10/06/2021         General Chemistry:   Lab Results   Component Value Date    NA 138 10/06/2021    K 4.2 10/06/2021    CL 101 10/06/2021    CO2 28 10/06/2021    GAP 9 10/06/2021    BUN 18 10/06/2021    CR 0.74 10/06/2021    GLU 86 10/06/2021    CA 9.3 10/06/2021    ALBUMIN 4.3 10/06/2021    MG 2.1 10/02/2021    TOTBILI 0.4 10/06/2021      Coagulation: No results found for: PT, PTT, INR    PAC Plan    Interview: Clinic Interview    ASA Score: 2    Anesthesia Options Discussed: General                  DOS Orders:  T&C        PAC RISK ASSESSMENTS:   *Duke Activity Status Index (DASI): 50.2, Calculated METS: 8.91 (score < 25 correlates with increased risk for death, MI, and moderate to severe complications, max score 57)  *Revised Cardiac Risk Index (RCRI): 1 points=0.9% risk for major adverse cardiac events (myocardial infarction, pulmonary edema, cardiac arrest)  *STOP-BANG Score: 4 (total 5-8 high risk for OSA, 3-4 with HCO3 >28 also high risk. OSA associated with greater than twice the odds for respiratory failure, cardiac events, ICU admission, and difficult intubation)        Alerts: No

## 2022-02-27 ENCOUNTER — Encounter: Admit: 2022-02-27 | Discharge: 2022-02-27 | Payer: BC Managed Care – HMO

## 2022-02-27 ENCOUNTER — Inpatient Hospital Stay: Admit: 2022-02-27 | Discharge: 2022-02-27 | Payer: BC Managed Care – HMO

## 2022-02-27 DIAGNOSIS — E669 Obesity, unspecified: Secondary | ICD-10-CM

## 2022-02-27 DIAGNOSIS — F209 Schizophrenia, unspecified: Secondary | ICD-10-CM

## 2022-02-27 DIAGNOSIS — H5347 Heteronymous bilateral field defects: Secondary | ICD-10-CM

## 2022-02-27 DIAGNOSIS — D352 Benign neoplasm of pituitary gland: Secondary | ICD-10-CM

## 2022-02-27 DIAGNOSIS — E221 Hyperprolactinemia: Secondary | ICD-10-CM

## 2022-02-27 DIAGNOSIS — R030 Elevated blood-pressure reading, without diagnosis of hypertension: Secondary | ICD-10-CM

## 2022-02-27 DIAGNOSIS — N911 Secondary amenorrhea: Secondary | ICD-10-CM

## 2022-02-27 MED ORDER — PROPOFOL INJ 10 MG/ML IV VIAL
INTRAVENOUS | 0 refills | Status: DC
Start: 2022-02-27 — End: 2022-02-27
  Administered 2022-02-27: 20:00:00 150 mg via INTRAVENOUS

## 2022-02-27 MED ORDER — ARTIFICIAL TEARS (PF) SINGLE DOSE DROPS GROUP
OPHTHALMIC | 0 refills | Status: DC
Start: 2022-02-27 — End: 2022-02-27
  Administered 2022-02-27: 21:00:00 2 [drp] via OPHTHALMIC

## 2022-02-27 MED ORDER — REMIFENTANYL 1000MCG IN NS 20ML (OR)
INTRAVENOUS | 0 refills | Status: DC
Start: 2022-02-27 — End: 2022-02-27
  Administered 2022-02-27 (×2): .05 ug/kg/min via INTRAVENOUS

## 2022-02-27 MED ORDER — ONDANSETRON HCL (PF) 4 MG/2 ML IJ SOLN
INTRAVENOUS | 0 refills | Status: DC
Start: 2022-02-27 — End: 2022-02-27
  Administered 2022-02-27: 23:00:00 4 mg via INTRAVENOUS

## 2022-02-27 MED ORDER — ACETAMINOPHEN 1,000 MG/100 ML (10 MG/ML) IV SOLN
INTRAVENOUS | 0 refills | Status: DC
Start: 2022-02-27 — End: 2022-02-27
  Administered 2022-02-27: 23:00:00 1000 mg via INTRAVENOUS

## 2022-02-27 MED ORDER — PHENYLEPHRINE 40 MCG/ML IN NS IV DRIP (STD CONC)
INTRAVENOUS | 0 refills | Status: DC
Start: 2022-02-27 — End: 2022-02-27
  Administered 2022-02-27 (×2): .5 ug/kg/min via INTRAVENOUS

## 2022-02-27 MED ORDER — MIDAZOLAM 1 MG/ML IJ SOLN
INTRAVENOUS | 0 refills | Status: DC
Start: 2022-02-27 — End: 2022-02-27
  Administered 2022-02-27: 20:00:00 2 mg via INTRAVENOUS

## 2022-02-27 MED ORDER — LIDOCAINE (PF) 200 MG/10 ML (2 %) IJ SYRG
INTRAVENOUS | 0 refills | Status: DC
Start: 2022-02-27 — End: 2022-02-27
  Administered 2022-02-27: 20:00:00 100 mg via INTRAVENOUS

## 2022-02-27 MED ORDER — PROPOFOL 10 MG/ML IV EMUL 100 ML (INFUSION)(AM)(OR)
INTRAVENOUS | 0 refills | Status: DC
Start: 2022-02-27 — End: 2022-02-27
  Administered 2022-02-27: 20:00:00 120 ug/kg/min via INTRAVENOUS

## 2022-02-27 MED ORDER — SUCCINYLCHOLINE CHLORIDE 20 MG/ML IJ SOLN
INTRAVENOUS | 0 refills | Status: DC
Start: 2022-02-27 — End: 2022-02-27
  Administered 2022-02-27: 21:00:00 100 mg via INTRAVENOUS

## 2022-02-27 MED ORDER — ELECTROLYTE-A IV SOLP
INTRAVENOUS | 0 refills | Status: DC
Start: 2022-02-27 — End: 2022-02-27
  Administered 2022-02-27 (×2): via INTRAVENOUS

## 2022-02-27 MED ORDER — FENTANYL CITRATE (PF) 50 MCG/ML IJ SOLN
INTRAVENOUS | 0 refills | Status: DC
Start: 2022-02-27 — End: 2022-02-27
  Administered 2022-02-27: 20:00:00 100 ug via INTRAVENOUS

## 2022-02-27 MED ADMIN — SODIUM CHLORIDE 0.9 % IV SOLP [27838]: 1000.000 mL | INTRAVENOUS | @ 23:00:00 | Stop: 2022-02-27 | NDC 00338004904

## 2022-02-27 MED ADMIN — LIDOCAINE-EPINEPHRINE 1 %-1:100,000 IJ SOLN [15955]: 5 mL | INTRAMUSCULAR | @ 21:00:00 | Stop: 2022-02-27 | NDC 00409317817

## 2022-02-27 MED ADMIN — SODIUM CHLORIDE 0.9 % IV SOLP [27838]: INTRAVENOUS | @ 19:00:00 | Stop: 2022-02-27 | NDC 00338004904

## 2022-02-27 MED ADMIN — SODIUM CHLORIDE 0.9 % IR SOLN [11403]: 1000 mL | @ 21:00:00 | Stop: 2022-02-27 | NDC 00338004804

## 2022-02-27 MED ADMIN — EPINEPHRINE 1 MG/ML IJ SOLN [2850]: 2 mL | @ 21:00:00 | Stop: 2022-02-27 | NDC 42023016801

## 2022-02-27 MED ADMIN — CEFAZOLIN 1 GRAM IJ SOLR [1445]: 1000 mL | @ 21:00:00 | Stop: 2022-02-27 | NDC 00143992490

## 2022-02-27 MED ADMIN — THROMBIN (BOVINE) 5,000 UNIT TP SOLR [164515]: 5000 [IU] | TOPICAL | @ 21:00:00 | Stop: 2022-02-27 | NDC 60793031501

## 2022-02-27 MED ADMIN — OXYMETAZOLINE 0.05 % NA SPRY [5943]: 10 | NASAL | @ 21:00:00 | Stop: 2022-02-27 | NDC 00904676130

## 2022-02-27 MED ADMIN — CEFAZOLIN INJ 1GM IVP [210319]: 2 g | INTRAVENOUS | @ 21:00:00 | Stop: 2022-02-27 | NDC 00143992490

## 2022-02-28 ENCOUNTER — Encounter: Admit: 2022-02-28 | Discharge: 2022-02-28 | Payer: BC Managed Care – HMO

## 2022-02-28 ENCOUNTER — Inpatient Hospital Stay: Admit: 2022-02-28 | Discharge: 2022-02-28 | Payer: BC Managed Care – HMO

## 2022-02-28 MED ADMIN — ACETAMINOPHEN 325 MG PO TAB [101]: 650 mg | ORAL | @ 17:00:00 | NDC 00904677361

## 2022-02-28 MED ADMIN — CEFAZOLIN INJ 1GM IVP [210319]: 2 g | INTRAVENOUS | @ 06:00:00 | Stop: 2022-02-28 | NDC 60505614200

## 2022-02-28 MED ADMIN — MAGNESIUM SULFATE IN D5W 1 GRAM/100 ML IV PGBK [166578]: 1 g | INTRAVENOUS | @ 09:00:00 | Stop: 2022-02-28 | NDC 00409672755

## 2022-02-28 MED ADMIN — FLUPHENAZINE HCL 1 MG PO TAB [3218]: 1 mg | ORAL | @ 14:00:00 | NDC 00527178801

## 2022-02-28 MED ADMIN — ACETAMINOPHEN 325 MG PO TAB [101]: 650 mg | ORAL | NDC 00904677361

## 2022-02-28 MED ADMIN — DIVALPROEX 500 MG PO TB24 [80756]: 1500 mg | ORAL | @ 02:00:00 | NDC 00904636445

## 2022-02-28 MED ADMIN — SODIUM CHLORIDE 0.65 % NA SPRA [29676]: 2 | NASAL | @ 14:00:00 | NDC 00904386575

## 2022-02-28 MED ADMIN — CEFAZOLIN INJ 1GM IVP [210319]: 2 g | INTRAVENOUS | @ 14:00:00 | Stop: 2022-02-28 | NDC 60505614200

## 2022-02-28 MED ADMIN — MELATONIN 5 MG PO TAB [168576]: 5 mg | ORAL | @ 02:00:00 | NDC 77333052025

## 2022-02-28 MED ADMIN — SODIUM CHLORIDE 0.9 % IV SOLP [27838]: 1000.000 mL | INTRAVENOUS | @ 08:00:00 | Stop: 2022-02-28 | NDC 00338004904

## 2022-02-28 MED ADMIN — QUETIAPINE 100 MG PO TAB [79198]: 300 mg | ORAL | @ 02:00:00 | NDC 00904664061

## 2022-02-28 MED ADMIN — LABETALOL 5 MG/ML IV SYRG [86579]: 10 mg | INTRAVENOUS | @ 01:00:00 | NDC 00409233924

## 2022-02-28 MED ADMIN — SENNOSIDES-DOCUSATE SODIUM 8.6-50 MG PO TAB [40926]: 1 | ORAL | @ 02:00:00 | NDC 00536124801

## 2022-03-01 ENCOUNTER — Encounter: Admit: 2022-03-01 | Discharge: 2022-03-01 | Payer: BC Managed Care – HMO

## 2022-03-01 DIAGNOSIS — F209 Schizophrenia, unspecified: Secondary | ICD-10-CM

## 2022-03-01 DIAGNOSIS — H5347 Heteronymous bilateral field defects: Secondary | ICD-10-CM

## 2022-03-01 DIAGNOSIS — E669 Obesity, unspecified: Secondary | ICD-10-CM

## 2022-03-01 DIAGNOSIS — R030 Elevated blood-pressure reading, without diagnosis of hypertension: Secondary | ICD-10-CM

## 2022-03-01 DIAGNOSIS — E221 Hyperprolactinemia: Secondary | ICD-10-CM

## 2022-03-01 DIAGNOSIS — D352 Benign neoplasm of pituitary gland: Secondary | ICD-10-CM

## 2022-03-01 DIAGNOSIS — N911 Secondary amenorrhea: Secondary | ICD-10-CM

## 2022-03-01 MED ADMIN — ACETAMINOPHEN 325 MG PO TAB [101]: 650 mg | ORAL | @ 08:00:00 | Stop: 2022-03-01 | NDC 00904677361

## 2022-03-01 MED ADMIN — QUETIAPINE 100 MG PO TAB [79198]: 300 mg | ORAL | @ 02:00:00 | NDC 00904664061

## 2022-03-01 MED ADMIN — DIVALPROEX 500 MG PO TB24 [80756]: 1500 mg | ORAL | @ 02:00:00 | NDC 00904636445

## 2022-03-01 MED ADMIN — SODIUM CHLORIDE 0.9 % FLUSH [210767]: 10 mL | @ 14:00:00 | Stop: 2022-03-01 | NDC 08290306546

## 2022-03-01 MED ADMIN — FLUPHENAZINE HCL 1 MG PO TAB [3218]: 1 mg | ORAL | @ 14:00:00 | Stop: 2022-03-01 | NDC 00527178801

## 2022-03-02 ENCOUNTER — Encounter: Admit: 2022-03-02 | Discharge: 2022-03-02 | Payer: BC Managed Care – HMO

## 2022-03-02 NOTE — Telephone Encounter
Left voicemail with contact number for update on recovery.      Chanee Edwyna Dangerfield, RN  Clinical Nurse Coordinator  Neurosurgery  [phone]

## 2022-03-07 ENCOUNTER — Encounter: Admit: 2022-03-07 | Discharge: 2022-03-07 | Payer: BC Managed Care – HMO

## 2022-03-07 ENCOUNTER — Ambulatory Visit: Admit: 2022-03-07 | Discharge: 2022-03-07 | Payer: BC Managed Care – HMO

## 2022-03-07 DIAGNOSIS — D352 Benign neoplasm of pituitary gland: Secondary | ICD-10-CM

## 2022-03-07 DIAGNOSIS — E669 Obesity, unspecified: Secondary | ICD-10-CM

## 2022-03-07 DIAGNOSIS — H5347 Heteronymous bilateral field defects: Secondary | ICD-10-CM

## 2022-03-07 DIAGNOSIS — R030 Elevated blood-pressure reading, without diagnosis of hypertension: Secondary | ICD-10-CM

## 2022-03-07 DIAGNOSIS — F209 Schizophrenia, unspecified: Secondary | ICD-10-CM

## 2022-03-07 DIAGNOSIS — E221 Hyperprolactinemia: Secondary | ICD-10-CM

## 2022-03-07 DIAGNOSIS — N911 Secondary amenorrhea: Secondary | ICD-10-CM

## 2022-03-08 ENCOUNTER — Encounter: Admit: 2022-03-08 | Discharge: 2022-03-08 | Payer: BC Managed Care – HMO

## 2022-03-08 NOTE — Telephone Encounter
Received lab results from [facility]. Placed in Dr. Cyndia Skeeters folder for review.

## 2022-03-28 ENCOUNTER — Encounter: Admit: 2022-03-28 | Discharge: 2022-03-28 | Payer: BC Managed Care – HMO

## 2022-03-28 ENCOUNTER — Ambulatory Visit: Admit: 2022-03-28 | Discharge: 2022-03-28 | Payer: BC Managed Care – HMO

## 2022-03-28 DIAGNOSIS — E669 Obesity, unspecified: Secondary | ICD-10-CM

## 2022-03-28 DIAGNOSIS — N911 Secondary amenorrhea: Secondary | ICD-10-CM

## 2022-03-28 DIAGNOSIS — D352 Benign neoplasm of pituitary gland: Secondary | ICD-10-CM

## 2022-03-28 DIAGNOSIS — R030 Elevated blood-pressure reading, without diagnosis of hypertension: Secondary | ICD-10-CM

## 2022-03-28 DIAGNOSIS — F209 Schizophrenia, unspecified: Secondary | ICD-10-CM

## 2022-03-28 DIAGNOSIS — H5347 Heteronymous bilateral field defects: Secondary | ICD-10-CM

## 2022-03-28 DIAGNOSIS — E221 Hyperprolactinemia: Secondary | ICD-10-CM

## 2022-03-28 NOTE — Patient Instructions
Thank you for allowing us to participate in your care today!    Dr.Milligan would like to follow up with you in 2 months with a scan.    If you require imaging that you would like to be performed in a facility outside of [place] prior to your next appointment, please contact our office and confirm which location you would like via MyChart or phone call at least one month prior to your appointment so we may precert and coordinate care in an efficient manner.     To schedule or change an appointment with Dr.Milligan please call [phone] and choose option 1.  To schedule or change an appointment with our radiology department call  [phone].  To get in contact with his nurse you may call [phone].   To fax FMLA/ records [phone].

## 2022-04-09 ENCOUNTER — Ambulatory Visit: Admit: 2022-04-09 | Discharge: 2022-04-09 | Payer: BC Managed Care – HMO

## 2022-04-09 ENCOUNTER — Encounter: Admit: 2022-04-09 | Discharge: 2022-04-09 | Payer: BC Managed Care – HMO

## 2022-04-09 DIAGNOSIS — R030 Elevated blood-pressure reading, without diagnosis of hypertension: Secondary | ICD-10-CM

## 2022-04-09 DIAGNOSIS — H5347 Heteronymous bilateral field defects: Secondary | ICD-10-CM

## 2022-04-09 DIAGNOSIS — D352 Benign neoplasm of pituitary gland: Secondary | ICD-10-CM

## 2022-04-09 DIAGNOSIS — F209 Schizophrenia, unspecified: Secondary | ICD-10-CM

## 2022-04-09 DIAGNOSIS — N911 Secondary amenorrhea: Secondary | ICD-10-CM

## 2022-04-09 DIAGNOSIS — E221 Hyperprolactinemia: Secondary | ICD-10-CM

## 2022-04-09 DIAGNOSIS — E669 Obesity, unspecified: Secondary | ICD-10-CM

## 2022-04-09 NOTE — Progress Notes
Date of Service: 04/09/2022    Subjective:             Kristin Maynard is a 26 y.o. female.    History of Present Illness    FU transphenoidal surgery for prolactinoma  Doing well  No pain, pressure or drainage         Review of Systems   Constitutional: Negative.    HENT: Negative.    Eyes: Negative.    Respiratory: Negative.    Cardiovascular: Negative.    Gastrointestinal: Negative.    Endocrine: Negative.    Genitourinary: Negative.    Musculoskeletal: Negative.    Skin: Negative.    Allergic/Immunologic: Negative.    Neurological: Negative.    Hematological: Negative.    Psychiatric/Behavioral: Negative.          Objective:         ? acetaminophen (TYLENOL) 325 mg tablet Take two tablets by mouth every 4 hours as needed.   ? divalproex (DEPAKOTE ER) 500 mg ER tablet Take three tablets by mouth at bedtime daily. Take with food.   ? fluPHENAZine (PROLIXIN) 1 mg tablet Take one tablet by mouth daily.   ? folic acid 0.8 mg cap Take one capsule by mouth daily.   ? melatonin 5 mg tablet Take one tablet by mouth at bedtime as needed.   ? QUEtiapine (SEROQUEL) 300 mg tablet Take one tablet by mouth at bedtime daily.   ? REXULTI 1 mg tablet Take 1.5 mg by mouth daily.   ? sennosides-docusate sodium (SENOKOT-S) 8.6/50 mg tablet Take one tablet by mouth twice daily as needed. Indications: constipation   ? sodium chloride (SEA MIST) 0.65 % nasal spray Apply two sprays to each nostril as directed every 2 hours while awake.     Vitals:    04/09/22 1342   BP: 117/87   BP Source: Arm, Left Upper   Pulse: 94   Temp: 36.5 ?C (97.7 ?F)   TempSrc: Skin   PainSc: Zero   Weight: 105.2 kg (232 lb)   Height: 157.5 cm (5' 2)     Body mass index is 42.43 kg/m?Marland Kitchen     Physical Exam    Nasal Endoscopic Examination (Separate Procedure):     Indications: Other: post transphenoidal surgery     Procedure in detail:   Patient is anesthetized with 4% lidocaine and decongested with neosynephrine.  After obtaining verbal consent, a rigid 30 degree nasal endoscope was inserted into the bilateral nares to inspect the bilateral sinonasal cavities.  The following findings were noted:     Nasal septum:  The septum is straight     Right Sinonasal Cavity: There is evidence of prior surgery.  The mucosa is not inflamed and there is no rhinorrhea present. The inferior turbinate is normal.  The inferior meatus is normal.  The middle turbinate is normal.  The middle meatus is normal with no drainage.  The superior turbinate is normal.  The sphenoethmoidal recess and superior meatus is normal with no drainage.  There are mucosal polyps within the sphenoid sinus.       Left Sinonasal Cavity: There is evidence of prior surgery.  The mucosa is not inflamed and there is no rhinorrhea present. The inferior turbinate is normal.  The inferior meatus is normal.  The middle turbinate is normal.  The middle meatus is normal with no drainage.  The superior turbinate is normal.  The sphenoethmoidal recess and superior meatus is normal with no drainage.  There are no visualized masses or polyps.       Naspharynx:  The nasopharynx is normal in appearance.      Cultures are not obtained today     The patient tolerated the procedure well without complications.       Assessment and Plan:  Encounter Diagnoses   Name Primary?   ? Pituitary macroadenoma (HCC) Yes     Well healed from surgery  Ok to stop irrigations if desired  rtc with me as needed

## 2022-04-20 ENCOUNTER — Encounter: Admit: 2022-04-20 | Discharge: 2022-04-20 | Payer: BC Managed Care – HMO

## 2022-04-23 ENCOUNTER — Encounter: Admit: 2022-04-23 | Discharge: 2022-04-23 | Payer: BC Managed Care – HMO

## 2022-04-23 NOTE — Telephone Encounter
Outgoing Call placed to [place], pt's father. Two patient identifiers used. Informed him that Dr Ronny Flurry needs to discuss medication regimen directly with the [facility] psychiatrist since bromocripine exacerbated her schizophrenia before.     If Kristin Maynard/Kristin Maynard are interested, then they may get a 2nd opinion from a [facility] endocrinologist (who would also need to discuss directly w the patient's psychiatrist).

## 2022-05-22 ENCOUNTER — Encounter: Admit: 2022-05-22 | Discharge: 2022-05-22 | Payer: BC Managed Care – HMO

## 2022-05-30 ENCOUNTER — Encounter: Admit: 2022-05-30 | Discharge: 2022-05-30 | Payer: BC Managed Care – HMO

## 2022-05-30 ENCOUNTER — Ambulatory Visit: Admit: 2022-05-30 | Discharge: 2022-05-30 | Payer: BC Managed Care – HMO

## 2022-05-30 DIAGNOSIS — 1 ERRONEOUS ENCOUNTER--DISREGARD: Secondary | ICD-10-CM

## 2022-05-30 NOTE — Progress Notes
Faxed signed order for MRI head, with precert info, and ins card image, to [employer] (F: [phone]). Confirmation of successful transmission received.

## 2022-05-30 NOTE — Progress Notes
This encounter was created in error. Please disregard.

## 2022-05-31 ENCOUNTER — Encounter: Admit: 2022-05-31 | Discharge: 2022-05-31 | Payer: BC Managed Care – HMO

## 2022-05-31 NOTE — Progress Notes
Outgoing call placed to Franki Cabot, Jakyra's father, with no answer. Left detailed VM to set up Kristin Maynard's MRI at [facility]. Order has been faxed, no precert required. [place] appointment with Dr.Milligan will be scheduled after imaging is performed to discuss findings.

## 2022-06-12 ENCOUNTER — Encounter: Admit: 2022-06-12 | Discharge: 2022-06-12 | Payer: BC Managed Care – HMO

## 2022-06-12 NOTE — Progress Notes
Outoing call placed to [facility], [phone] medical records center with no answer. Voice message left requesting recent MRI head imaging and report performed 06/11/2022. Provided direct call back number and fax number.

## 2022-06-18 ENCOUNTER — Ambulatory Visit: Admit: 2022-06-18 | Discharge: 2022-06-18 | Payer: BC Managed Care – HMO

## 2022-06-20 ENCOUNTER — Encounter: Admit: 2022-06-20 | Discharge: 2022-06-20 | Payer: BC Managed Care – HMO

## 2022-07-10 ENCOUNTER — Encounter: Admit: 2022-07-10 | Discharge: 2022-07-10 | Payer: BC Managed Care – HMO

## 2022-07-11 NOTE — Telephone Encounter
Ms Kristin Maynard requested a call back due to worsening of AH today. She reports hearing voices which are telling her to "save everyone". She has a PPH of schizophrenia along with hx of multiply hospitalizations. Denies recent changes in her medications,except discontinuation of her Depakote several moths ago. Reports taking  Seroquel,Prolixin and Rexulti  currently. Ms Kristin Maynard denies command hallucinations,states that voices are not scarring her but makes her concentration worse. States that feels tired and wants get asleep. She denies any safety concerns,denies SI/HI or plan. She states that does not feel like she needs hospitalizations. She is requesting information about outpatient [facility] resources available to establish care with psychiatrist.  Patient is currently with her brother. Discussed plan to bring patient to ED in case of worsening of AH or any safety concerns. Ms Kristin Maynard and her brother verbalized understanding and agreed with the plan. They also agreed to call tomorrow with the request for establishing of outpatient [place] [facility] care.

## 2022-07-13 ENCOUNTER — Encounter: Admit: 2022-07-13 | Discharge: 2022-07-13 | Payer: BC Managed Care – HMO

## 2022-12-06 ENCOUNTER — Encounter: Admit: 2022-12-06 | Discharge: 2022-12-06 | Payer: BC Managed Care – HMO

## 2023-03-01 ENCOUNTER — Encounter: Admit: 2023-03-01 | Discharge: 2023-03-01 | Payer: BC Managed Care – HMO

## 2023-03-01 NOTE — Progress Notes
Spoke with Shanda Bumps at [employer] ([phone])-unable to precert MRI head for [employer], due to  policy cancellation 07/15/2022.

## 2023-03-20 ENCOUNTER — Encounter: Admit: 2023-03-20 | Discharge: 2023-03-20 | Payer: BC Managed Care – HMO

## 2023-03-20 NOTE — Telephone Encounter
Received call from [name], who provided new insurance information. This RN advised she  will contact them once we receive approval from insurance on pre certification and send orders to [facility]. Gandy voiced understanding.

## 2023-03-20 NOTE — Progress Notes
This RN initiated precertification through [facility] for patients upcoming MRI head. This RN spoke with Shanda Bumps who provided fax number to send additional clinical notes for review.     This RN faxed last office visit note from 06/18/2022 to [employer] at [phone] and received fax confirmation notes were successfully sent.     Case #: [id number]

## 2023-03-20 NOTE — Telephone Encounter
Received message from patients father, Kristin Maynard requesting MRI order be sent to [facility] in [place].     This RN called and spoke with Kristin Maynard. Patient name and DOB verified. This RN advised Kristin Maynard we are unable to send order to [facility] without pre certification but we do not have their insurance information. Kristin Maynard states he gave the insurance information to someone last night at [facility] but does not remember who. This RN advised it does not appear they noted this on the chart. Kristin Maynard to get patients insurance information and call back with details. This RN provided call back number.

## 2023-03-29 ENCOUNTER — Encounter: Admit: 2023-03-29 | Discharge: 2023-03-29 | Payer: PRIVATE HEALTH INSURANCE

## 2023-03-29 NOTE — Telephone Encounter
This RN LVM for patients father, [name] advising we will be canceling her upcoming appointment on 04/01/2023 as she has not completed imaging yet. Advised to return call to clinic to let us know where patient will be completing imaging so we can send orders and get her back on Dr. Fayrene Helper schedule. Call back number provided.

## 2023-03-29 NOTE — Telephone Encounter
Received message from patients father, Kristin Maynard stating he has a location within network for patient to complete imaging at.     This RN called and spoke with Kristin Maynard. Patient name and DOB verified. Kristin Maynard states he reached out to [employer] who provided him an email with in network facilities with [facility] in [place] being on the list. Kristin Maynard requesting we change facility location on pre certification, this RN discussed with Kristin Maynard that after speaking with insurance, they advised [facility] was OON but that this RN will call back to get clarification with [employer]. Kristin Maynard agreeable with plan.

## 2023-04-08 ENCOUNTER — Encounter: Admit: 2023-04-08 | Discharge: 2023-04-08 | Payer: PRIVATE HEALTH INSURANCE

## 2023-04-08 ENCOUNTER — Ambulatory Visit: Admit: 2023-04-08 | Discharge: 2023-04-08 | Payer: PRIVATE HEALTH INSURANCE

## 2023-04-08 ENCOUNTER — Ambulatory Visit: Admit: 2023-04-08 | Discharge: 2023-04-09 | Payer: PRIVATE HEALTH INSURANCE

## 2023-04-08 DIAGNOSIS — D352 Benign neoplasm of pituitary gland: Secondary | ICD-10-CM

## 2023-04-08 DIAGNOSIS — N911 Secondary amenorrhea: Secondary | ICD-10-CM

## 2023-04-08 DIAGNOSIS — E221 Hyperprolactinemia: Secondary | ICD-10-CM

## 2023-04-08 DIAGNOSIS — F209 Schizophrenia, unspecified: Secondary | ICD-10-CM

## 2023-04-08 DIAGNOSIS — H5347 Heteronymous bilateral field defects: Secondary | ICD-10-CM

## 2023-04-08 DIAGNOSIS — R030 Elevated blood-pressure reading, without diagnosis of hypertension: Secondary | ICD-10-CM

## 2023-04-08 DIAGNOSIS — E669 Obesity, unspecified: Secondary | ICD-10-CM

## 2023-04-08 LAB — PROLACTIN: PROLACTIN: 216 ng/mL — ABNORMAL HIGH (ref >60)

## 2023-04-08 NOTE — Progress Notes
[facility] SUBSEQUENT VISIT NOTE    04/08/2023    NAME: Kristin Maynard  DOB: 1995-12-05   PRIMARY CARE PROVIDER: Clabe Seal  REFERRING PROVIDER: Self, Referral  VISIT DATE: 04/08/2023    PATIENT SUMMARY: Kristin Maynard is a 27 y.o. female with a history of schizophrenia who was diagnosed with a pituitary macroadenoma on the basis of an extremely high prolactin level performed as a part of surveillance while on antipsychotic medication and apparently also amenorrhea.  She established with Dr. Annabell Sabal in March 2023 at [facility] ([place] office) and was started on bromocriptine. She stopped her antipsychotic medication and was ultimately admitted to inpatient psychiatry at [facility] with symptoms of psychosis.  Her prolactin on 09/25/2021 was 982.  Her primary endocrinologist was contacted and recommended a change to cabergoline.  The inpatient psychiatry team had significant difficulty obtaining control of her symptoms and ultimately discontinued the cabergoline.     She was referred for discussion of resection of her pituitary macroadenoma in the setting of intolerance to medical therapy.  She denies galactorrhea, peripheral visual loss, double vision, rhinorrhea.     We reviewed her MRI of the brain with and without contrast from 01/19/2022 with images including postcontrast dynamic pituitary imaging.  This demonstrates a 23 x 15 x 15 mm heterogeneously/hypoenhancing mass within an expanded sella causing significant compression and elevation of the optic apparatus which appears most consistent with a pituitary macroadenoma.  Compared with the brain MRI from 05/05/2020, the mass has enlarged notably from its 16 x 11 x 12 mm size at that time.  Currently, the location of the normal pituitary gland is somewhat difficult to ascertain but on the 2021 study, the gland appeared to be displaced over the top, right as well as bottom aspect of the sella.  There was left cavernous sinus abutment in 2021.  This appears stable on the current study with Knosp 1 involvement of the left cavernous sinus and Knosp 0 involvement on the right.    At surgery on 02/27/22, a heterogeneous, gray, partially discohensive, partially hemorrhagic mass was identified with normal gland along the right/anterior/inferior aspects of the tumor. A GTR appeared to be achieved with aggressive descent of the diaphragma and a slow weep of CSF from the superior right corner. The preliminary pathology was pituitary adenoma. The sella was reconstructed with lyoplant inlay/onlay and hydrogel glue with a good seal.     When she returned 03/28/22, The patient has not experienced any issues with their vision, nausea, vomiting, excessive thirst, or frequent nighttime urination. Blood work done a week after surgery showed a significant decrease in prolactin levels, from over 900 to 126, and a normal morning cortisol level of 18.7. The patient has not yet seen their endocrinologist, Dr. Threasa Beards, for a post-surgery checkup. They have not experienced any complications from the surgery and feel that they have recovered back to their pre-surgery state. The patient has not experienced any nasal discharge or other complications since the surgery. They are currently not under any physical activity restrictions and are able to resume normal activities such as lifting, pushing, carrying, and exercising.    When I saw her 06/18/22, in follow-up with an updated MRI performed at [facility].  This demonstrates a small area of hypoenhancement superior to the normal pituitary gland, essentially within the pituitary stalk that appears consistent with residual adenoma.  She reports that her last prolactin was around 120-130.  She denies any new or changing symptoms.  She  is waiting to hear back from her psychiatrist about the potential for trying very low-dose bromocriptine again.  She is concerned about the potential for exacerbating her psychosis and hallucinations.    HISTORY OF PRESENT ILLNESS:  I am seeing Kristin Maynard in neurosurgical follow up today, 04/08/2023. Returns today in follow-up now 1 year out from transsphenoidal surgery.  She feels that her vision is normal.  She denies headaches.  She denies galactorrhea.  She denies excessive fatigue.  She has been experiencing some weight gain as well as hypertension.  She was started on amlodipine by her primary care provider.  She transitioned to new insurance.  She denies rhinorrhea or sinonasal complaints.  She had a follow-up MRI performed recently at [facility] in [place].  This demonstrates stable left suprasellar hypoenhancing mass compared with her postoperative scan.  We discussed that this is most likely supradiaphragmatic and location.    ALLERGIES:  No Known Allergies    HOME MEDICATIONS:   acetaminophen (TYLENOL) 325 mg tablet Take two tablets by mouth every 4 hours as needed. (Patient not taking: Reported on 06/18/2022)    amLODIPine (NORVASC) 5 mg tablet Take one tablet by mouth daily.    divalproex (DEPAKOTE ER) 500 mg ER tablet Take three tablets by mouth at bedtime daily. Take with food. (Patient not taking: Reported on 06/18/2022)    fluPHENAZine (PROLIXIN) 1 mg tablet Take one tablet by mouth daily.    folic acid 0.8 mg cap Take one capsule by mouth daily.    melatonin 5 mg tablet Take one tablet by mouth at bedtime as needed. (Patient not taking: Reported on 06/18/2022)    OLANZapine (ZYPREXA ZYDIS) 15 mg rapid dissolve tablet Dissolve two tablets by mouth at bedtime daily.    QUEtiapine (SEROQUEL) 300 mg tablet Take one tablet by mouth at bedtime daily.    REXULTI 1 mg tablet Take 1.5 mg by mouth daily.    sennosides-docusate sodium (SENOKOT-S) 8.6/50 mg tablet Take one tablet by mouth twice daily as needed. Indications: constipation (Patient not taking: Reported on 06/18/2022)    sodium chloride (SEA MIST) 0.65 % nasal spray Apply two sprays to each nostril as directed every 2 hours while awake. (Patient not taking: Reported on 06/18/2022)       PHYSICAL EXAM:  Vitals:    04/08/23 1304   BP: (!) 142/102   BP Source: Arm, Left Upper   Pulse: 106   Weight: 111.6 kg (246 lb)   Height: 157.5 cm (5' 2)       Neuro: Awake and alert. Oriented x3. Speech-language are normal. Cranial nerve (PERRL, EOM full, face symmetric, V1-3 intact, tongue midline), motor (UE/LE), sensory (LT in UE/LE), cerebellar examinations are all normal.        ASSESSMENT/PLAN:   1. Macroprolactinoma (HCC)      Allisa Jury is a 27 y.o. woman with a history of macroprolactinoma (prolactin 982, tumor 2.3 cm) for whom dopamine agonist therapy for her tumor exacerbated her underlying schizophrenia. Options for management of her tumor were discussed, and surgery was recommended to reduce or eliminate her need for dopamine agonist therapy and she consented. At surgery, a heterogeneous, gray, partially discohensive, partially hemorrhagic mass was identified with normal gland along the right/anterior/inferior aspects of the tumor. A GTR appeared to be achieved with aggressive descent of the diaphragma and a slow weep of CSF from the superior right corner. The preliminary pathology was pituitary adenoma. The sella was reconstructed with lyoplant inlay/onlay and hydrogel  glue with a good seal. She awoke in stable neurological condition. By 1 month from surgery, serum prolactin decreased from 900->176.  Her endocrinologist was considering bromocriptine.       Today, she returns now 1 year out from subtotal resection of a macroprolactinoma.  She continues to have residual that is likely suprasellar or perhaps within the pituitary stalk.  She has not had a follow-up prolactin since late 2023.  I will have her get a prolactin drawn on the way out today.  If this is still less than 200, I suspect we will plan ongoing laboratory follow-up with a prolactin every 4 to 6 months.  If the prolactin exceeds 200, then we should consider redo surgery or stereotactic radiosurgery.  Likely, we will review at multidisciplinary pituitary conference regardless of the prolactin value.  She expresses concern that her endocrinologist closer to home continues to recommend bromocriptine which her psychiatrist has halted because of her history of exacerbated psychosis symptoms with dopamine agonist therapy.  I think it would be worthwhile for her to have a tertiary endocrinology evaluation and I will place a [place] endocrinology referral.  We will call her later this week with an update after the prolactin results.    ADDENDUM: Prolactin 219. Will review at pituitary conference.         Maryruth Eve, MD [facility]  Assistant Professor - Neurosurgery  [facility]  O: [phone] - F: [phone] - P: [phone]

## 2023-04-08 NOTE — Patient Instructions
Thank you for allowing [place] to participate in your care today!    Please complete your labs at your earliest convenience. We will follow up with you once we receive these results.     If you require imaging that you would like to be performed in a facility outside of [facility] prior to your next appointment, please contact our office and confirm which location you would like via MyChart or phone call at least one month prior to your appointment so we may precert and coordinate care in an efficient manner.     To schedule or change an appointment with Dr.Milligan please call [phone] and choose option 1.  To schedule or change an appointment with our radiology department call  [phone].  To get in contact with his nurse you may call [phone].   To fax FMLA/ records [phone]) - [phone].

## 2023-04-10 ENCOUNTER — Encounter: Admit: 2023-04-10 | Discharge: 2023-04-10 | Payer: PRIVATE HEALTH INSURANCE

## 2023-04-10 NOTE — Telephone Encounter
This RN called and spoke with patients father, [name]. Patient name and DOB verified. This RN advised Dr. Earl Gala reviewed patients prolactin results and advised it has increased a little bit since the last result. This RN advised [name] that Dr. Earl Gala will be discussing patient with pituitary board in the next week and we will reach back out with further recommendations afterwards. [name] agreeable with plan and had no concerns at this time.

## 2023-04-24 ENCOUNTER — Encounter: Admit: 2023-04-24 | Discharge: 2023-04-24 | Payer: PRIVATE HEALTH INSURANCE

## 2023-04-24 DIAGNOSIS — D352 Benign neoplasm of pituitary gland: Secondary | ICD-10-CM

## 2023-04-24 NOTE — Telephone Encounter
This RN LVM for patient to return call to clinic to discuss pituitary conference recommendations. Call back number provided.

## 2023-06-11 ENCOUNTER — Encounter: Admit: 2023-06-11 | Discharge: 2023-06-11 | Payer: PRIVATE HEALTH INSURANCE

## 2023-08-27 ENCOUNTER — Ambulatory Visit: Admit: 2023-08-27 | Discharge: 2023-08-28 | Payer: PRIVATE HEALTH INSURANCE

## 2023-08-27 ENCOUNTER — Encounter: Admit: 2023-08-27 | Discharge: 2023-08-27 | Payer: PRIVATE HEALTH INSURANCE

## 2023-08-27 DIAGNOSIS — E221 Hyperprolactinemia: Secondary | ICD-10-CM

## 2023-08-27 NOTE — Progress Notes
I have flagged medications for the provider to review/remove from medication list.  Thanks

## 2023-08-27 NOTE — Progress Notes
Date of Service: 08/27/2023    Subjective:             Kristin Maynard is a 28 y.o. female.    History of Present Illness    Reason for visit:  new patient visit, macroprolactinoma    She is referred by her neurosurgeon, Dr. Georgia Lopes.     HPI:  - found to have extremely high PRL on surveillance labs done while taking antipsychotic medication  - establish with outside endocrinologist. Dr. Annabell Sabal in March 2023 at [facility], [place], [place]  - prolactin on 09/25/2021 was 982   - started bromocriptine, but with exacerbation of psychosis, requiring inpatient psychiatry admission  - switched to cabergoline, also with difficult to control psychosis   - dopamine agonists stopped altogether  - MRI pituitary 01/2022 demonstrating a 23 x 15 x 15 mm heterogeneously/hypoenhancing mass within an expanded sella causing significant compression and elevation of the optic apparatus   - referred to Dr. Earl Gala,   - 02/27/2022 TSS, pathology Pit1 positive lactotroph adenoma     - 03/28/2022, post-op PRL 126, AM cortisol 18.7  - post-op MRI 06/11/2022  with small area of hypoenhancement superior to the normal pituitary gland, essentially within the pituitary stalk that appears consistent with residual adenom   - 04/05/2023 MRI pituitary: persistent hypo enhancing pituitary mass at and to the left of midline, appears very similar in size to prior study, displaces pituitary stalk to the left.   - discussed at pituitary tumor board 04/2023 - decision made to not re-try dopamine agonist therapy    She is doing well, no headache, no change in vision in the recent history.     She has not had a period in a long time.   She does not take estrogen or progesterone. This was stopped, she is unsure why. This was a long time ago. This was at [facility].     She has gained about 100lb since her tumor was discovered.   She is not exercising as much.   Her diet is described as trying to eat healthy, don't eat cake or anything  When she had the tumor, she     Her current weight is 243lb.    She likes to walk or use the treadmill or elliptical.  She has a way to track her steps daily.  She does not want any medication for weight loss at this time.     She has schizoaffective disorder, bipolar type.   She is currently taking clozapine.  Her psychiatrist is Kandis Cocking NP.   On current regimen, symptoms are well controlled.     She does not smoke tobacco.  She drinks 0-2 drinks per day.   She has never had blood clot. She is adopted, and does not know her family history.  She has no history of migraines.        ROS  Denies unintentional weight loss/weight gain, fatigue, fever, chills, N/V, abdominal pain, heat/cold intolerance, constipation/diarrhea, polyuria, polydipsia, rash, headache or vision change      Objective:          acetaminophen (TYLENOL) 325 mg tablet Take two tablets by mouth every 4 hours as needed. (Patient not taking: Reported on 06/18/2022)    amLODIPine (NORVASC) 5 mg tablet Take one tablet by mouth daily.    divalproex (DEPAKOTE ER) 500 mg ER tablet Take three tablets by mouth at bedtime daily. Take with food. (Patient not taking: Reported on 06/18/2022)  fluPHENAZine (PROLIXIN) 1 mg tablet Take one tablet by mouth daily.    folic acid 0.8 mg cap Take one capsule by mouth daily.    melatonin 5 mg tablet Take one tablet by mouth at bedtime as needed. (Patient not taking: Reported on 06/18/2022)    OLANZapine (ZYPREXA ZYDIS) 15 mg rapid dissolve tablet Dissolve two tablets by mouth at bedtime daily.    QUEtiapine (SEROQUEL) 300 mg tablet Take one tablet by mouth at bedtime daily.    REXULTI 1 mg tablet Take 1.5 mg by mouth daily.    sennosides-docusate sodium (SENOKOT-S) 8.6/50 mg tablet Take one tablet by mouth twice daily as needed. Indications: constipation (Patient not taking: Reported on 06/18/2022)    sodium chloride (SEA MIST) 0.65 % nasal spray Apply two sprays to each nostril as directed every 2 hours while awake. (Patient not taking: Reported on 06/18/2022)     There were no vitals filed for this visit.  There is no height or weight on file to calculate BMI.     Physical Exam  Physical Exam  General: alert, oriented to person, place, time event, no apparent distress  Head: normocephalic, atraumatic  Neck: no gross thyromegaly  Eyes: EOMI grossly intact  Pulm: no respiratory distress on room air  Psych: mood and affect congruent      Comprehensive Metabolic Profile    Lab Results   Component Value Date/Time    NA 139 03/01/2022 06:56 AM    K 4.1 03/01/2022 03:05 AM    CL 105 03/01/2022 03:05 AM    CO2 24 03/01/2022 03:05 AM    GAP 11 03/01/2022 03:05 AM    BUN 13 03/01/2022 03:05 AM    CR 0.68 03/01/2022 03:05 AM    GLU 127 (H) 03/01/2022 03:05 AM    Lab Results   Component Value Date/Time    CA 9.6 03/01/2022 03:05 AM    PO4 4.2 02/28/2022 02:13 AM    ALBUMIN 4.3 10/06/2021 06:25 AM    TOTPROT 7.2 10/06/2021 06:25 AM    ALKPHOS 66 10/06/2021 06:25 AM    AST 17 10/06/2021 06:25 AM    ALT 41 10/06/2021 06:25 AM    TOTBILI 0.4 10/06/2021 06:25 AM        Hemoglobin A1C   Date Value Ref Range Status   03/01/2022 5.8 (H) 4.0 - 5.7 % Final     Comment:     The ADA recommends that most patients with type 1 and type 2 diabetes maintain   an A1c level <7%.     09/29/2021 6.0 (H) 4.0 - 5.7 % Final     Comment:     The ADA recommends that most patients with type 1 and type 2 diabetes maintain   an A1c level <7%.       TSH   Date Value Ref Range Status   09/23/2021 1.06 0.35 - 5.00 MCU/ML Final       No results found for: MCALB24   No results found for: URMALBCRRAT  No results found for: MCALBR  LDL   Date Value Ref Range Status   09/29/2021 131 (H) <100 mg/dL Final     HDL   Date Value Ref Range Status   09/29/2021 55 >40 MG/DL Final     Triglycerides   Date Value Ref Range Status   09/29/2021 241 (H) <150 MG/DL Final     Cholesterol   Date Value Ref Range Status   09/29/2021 217 (H) <200 MG/DL Final  Assessment and Plan:    # hyperprolactinemia due to macroprolactinoma  # s/p pituitary TSS in 02/2022 with residual tumor and hyperprolactinemia  # amenorrhea    History as summarized above.  Her latest labs and imaging post-operatively show about 1.2cm residual tumor tissue with prolactin 217 in September 2024.  No interval lab testing done.      Today, she remains with amenorrhea.   She is intolerant to dopamine agonist therapy as previously both bromocriptine and cabergoline have worsening her schizoaffective disorder bipolar type, requiring admission to inpatient psychiatry.     Unfortunately, other medical options for residual prolactinoma are limited.  It appears her residual tumor is smaller and not affecting optic structures as of her last MRI in September 2024.  Endocrine therapy should be aimed at restoring menses with the goal of withdrawal bleeding to reduce the risk of endometrial cancer.  I did discuss the risks and benefits of estrogen/progesterone therapy with Shataria today.  I would like for her to meet further with gynecology for discussion on types of estradiol and progesterones and which is best for her.     We will fax labs to [facility] for repeat prolactin and labs for CMP, A1c, TSH, free T4 and IGF-1 at this time. No s/sx of adrenal insufficiency.      Will also discuss her case with Dr. Earl Gala regarding timing of repeat imaging and what the role for repeat surgery or radiation therapy might be in her case.     RTC 6 months    Estill Bakes D.O.   Endocrinology, Metabolism, Clinical Pharmacology   Pager: [phone]  Available on Voalte

## 2023-08-28 ENCOUNTER — Encounter: Admit: 2023-08-28 | Discharge: 2023-08-28 | Payer: PRIVATE HEALTH INSURANCE

## 2023-08-28 DIAGNOSIS — D352 Benign neoplasm of pituitary gland: Secondary | ICD-10-CM

## 2023-08-30 ENCOUNTER — Encounter: Admit: 2023-08-30 | Discharge: 2023-08-30 | Payer: PRIVATE HEALTH INSURANCE

## 2023-11-15 ENCOUNTER — Encounter: Admit: 2023-11-15 | Discharge: 2023-11-15 | Payer: PRIVATE HEALTH INSURANCE

## 2023-11-15 NOTE — Telephone Encounter
 Maile Score, DO  Annitta Basil, RN  May we please contact Zyona and her father to follow up on pituitary labs that were ordered in February? She may needs these faxed again. Thank you    RN called pt to confirm the lab.  Please fax to [facility] at [phone]  RN faxed labs and sent copies to patient.

## 2024-02-27 LAB — COMPREHENSIVE METABOLIC PANEL
ALBUMIN: 4.3 g/dL (ref 3.5–5.0)
ALK PHOSPHATASE: 93 U/L (ref 40–150)
ALT: 53 U/L (ref <34)
AST: 28 U/L (ref 11–34)
BLD UREA NITROGEN: 15 mg/dL (ref 7–18.7)
CALCIUM: 9.6 mg/dL (ref 8.4–10.2)
CHLORIDE: 107 mmol/L (ref 98–107)
CO2: 24 mmol/L (ref 22–29)
CREATININE: 0.7 mg/dL (ref 0.50–1.00)
GLUCOSE,PANEL: 102 mg/dL (ref 70–100)
POTASSIUM: 4.2 mmol/L (ref 3.5–5.1)
SODIUM: 139 mmol/L (ref 136–145)
TOTAL BILIRUBIN: 0.4 mg/dL (ref 0.2–1.2)
TOTAL PROTEIN: 8.4 g/dL (ref 6.4–8.3)

## 2024-02-27 LAB — FREE T4 (FREE THYROXINE) ONLY: FREE T4: 0.8 ng/dL (ref 0.70–1.48)

## 2024-02-27 LAB — HEMOGLOBIN A1C: HEMOGLOBIN A1C: 5.8 %

## 2024-02-27 LAB — THYROID STIMULATING HORMONE-TSH: TSH: 0.9 u[IU]/mL (ref 0.35–4.94)

## 2024-03-04 ENCOUNTER — Encounter: Admit: 2024-03-04 | Discharge: 2024-03-04 | Payer: PRIVATE HEALTH INSURANCE

## 2024-03-04 DIAGNOSIS — E221 Hyperprolactinemia: Principal | ICD-10-CM
